# Patient Record
Sex: Female | Born: 1985 | Race: White | Hispanic: No | Marital: Married | Smoking: Current every day smoker
Health system: Southern US, Community
[De-identification: ages and names within clinical notes are randomized; demographics above are authoritative.]

## PROBLEM LIST (undated history)

## (undated) ENCOUNTER — Inpatient Hospital Stay (HOSPITAL_COMMUNITY): Payer: Self-pay

## (undated) DIAGNOSIS — O1490 Unspecified pre-eclampsia, unspecified trimester: Secondary | ICD-10-CM

## (undated) DIAGNOSIS — F329 Major depressive disorder, single episode, unspecified: Secondary | ICD-10-CM

## (undated) DIAGNOSIS — K8020 Calculus of gallbladder without cholecystitis without obstruction: Secondary | ICD-10-CM

## (undated) DIAGNOSIS — E039 Hypothyroidism, unspecified: Secondary | ICD-10-CM

## (undated) DIAGNOSIS — R4586 Emotional lability: Secondary | ICD-10-CM

## (undated) DIAGNOSIS — F32A Depression, unspecified: Secondary | ICD-10-CM

## (undated) DIAGNOSIS — K219 Gastro-esophageal reflux disease without esophagitis: Secondary | ICD-10-CM

## (undated) DIAGNOSIS — R5383 Other fatigue: Secondary | ICD-10-CM

## (undated) DIAGNOSIS — E785 Hyperlipidemia, unspecified: Secondary | ICD-10-CM

## (undated) DIAGNOSIS — I10 Essential (primary) hypertension: Secondary | ICD-10-CM

## (undated) DIAGNOSIS — E669 Obesity, unspecified: Secondary | ICD-10-CM

## (undated) DIAGNOSIS — Z8751 Personal history of pre-term labor: Secondary | ICD-10-CM

## (undated) DIAGNOSIS — Z8742 Personal history of other diseases of the female genital tract: Secondary | ICD-10-CM

## (undated) DIAGNOSIS — Z720 Tobacco use: Secondary | ICD-10-CM

## (undated) DIAGNOSIS — Q430 Meckel's diverticulum (displaced) (hypertrophic): Secondary | ICD-10-CM

## (undated) DIAGNOSIS — N83201 Unspecified ovarian cyst, right side: Secondary | ICD-10-CM

## (undated) DIAGNOSIS — F17200 Nicotine dependence, unspecified, uncomplicated: Secondary | ICD-10-CM

## (undated) DIAGNOSIS — F419 Anxiety disorder, unspecified: Secondary | ICD-10-CM

## (undated) DIAGNOSIS — F988 Other specified behavioral and emotional disorders with onset usually occurring in childhood and adolescence: Secondary | ICD-10-CM

## (undated) DIAGNOSIS — K760 Fatty (change of) liver, not elsewhere classified: Secondary | ICD-10-CM

## (undated) DIAGNOSIS — E569 Vitamin deficiency, unspecified: Secondary | ICD-10-CM

## (undated) DIAGNOSIS — Z8619 Personal history of other infectious and parasitic diseases: Secondary | ICD-10-CM

## (undated) DIAGNOSIS — B977 Papillomavirus as the cause of diseases classified elsewhere: Secondary | ICD-10-CM

## (undated) HISTORY — PX: DIAGNOSTIC LAPAROSCOPY: SUR761

## (undated) HISTORY — DX: Anxiety disorder, unspecified: F41.9

## (undated) HISTORY — PX: ACHILLES TENDON SURGERY: SHX542

## (undated) HISTORY — DX: Personal history of other infectious and parasitic diseases: Z86.19

## (undated) HISTORY — DX: Tobacco use: Z72.0

## (undated) HISTORY — DX: Fatty (change of) liver, not elsewhere classified: K76.0

## (undated) HISTORY — PX: CHOLECYSTECTOMY: SHX55

## (undated) HISTORY — PX: TUBAL LIGATION: SHX77

## (undated) HISTORY — DX: Essential (primary) hypertension: I10

## (undated) HISTORY — PX: CHOLECYSTECTOMY, LAPAROSCOPIC: SHX56

## (undated) HISTORY — DX: Meckel's diverticulum (displaced) (hypertrophic): Q43.0

## (undated) HISTORY — DX: Calculus of gallbladder without cholecystitis without obstruction: K80.20

## (undated) HISTORY — PX: WISDOM TOOTH EXTRACTION: SHX21

## (undated) HISTORY — DX: Hyperlipidemia, unspecified: E78.5

## (undated) HISTORY — DX: Other fatigue: R53.83

---
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
## 2006-04-05 ENCOUNTER — Ambulatory Visit: Payer: Self-pay | Admitting: Family Medicine

## 2008-04-29 ENCOUNTER — Ambulatory Visit: Payer: Self-pay | Admitting: Family Medicine

## 2008-04-29 DIAGNOSIS — E568 Deficiency of other vitamins: Secondary | ICD-10-CM

## 2008-04-29 DIAGNOSIS — K5909 Other constipation: Secondary | ICD-10-CM | POA: Insufficient documentation

## 2008-04-29 LAB — CONVERTED CEMR LAB
Specific Gravity, Urine: 1.015
pH: 7

## 2008-04-30 ENCOUNTER — Encounter: Payer: Self-pay | Admitting: Family Medicine

## 2008-05-02 ENCOUNTER — Encounter: Payer: Self-pay | Admitting: Family Medicine

## 2009-02-19 ENCOUNTER — Ambulatory Visit: Payer: Self-pay | Admitting: Family Medicine

## 2009-02-19 DIAGNOSIS — Z9189 Other specified personal risk factors, not elsewhere classified: Secondary | ICD-10-CM | POA: Insufficient documentation

## 2009-02-19 DIAGNOSIS — F17200 Nicotine dependence, unspecified, uncomplicated: Secondary | ICD-10-CM

## 2009-02-21 ENCOUNTER — Encounter: Payer: Self-pay | Admitting: Family Medicine

## 2009-02-24 LAB — CONVERTED CEMR LAB: Hemoglobin: 15.5 g/dL — ABNORMAL HIGH (ref 12.0–15.0)

## 2009-06-20 ENCOUNTER — Ambulatory Visit: Payer: Self-pay | Admitting: Family Medicine

## 2009-06-20 LAB — CONVERTED CEMR LAB
Nitrite: POSITIVE
Specific Gravity, Urine: 1.01

## 2009-09-03 ENCOUNTER — Ambulatory Visit: Payer: Self-pay | Admitting: Family Medicine

## 2009-09-19 ENCOUNTER — Emergency Department (HOSPITAL_COMMUNITY): Admission: EM | Admit: 2009-09-19 | Discharge: 2009-09-19 | Payer: Self-pay | Admitting: Emergency Medicine

## 2009-09-19 ENCOUNTER — Encounter: Payer: Self-pay | Admitting: Family Medicine

## 2009-10-06 ENCOUNTER — Ambulatory Visit: Payer: Self-pay | Admitting: Family Medicine

## 2009-10-06 DIAGNOSIS — R5383 Other fatigue: Secondary | ICD-10-CM

## 2009-10-06 DIAGNOSIS — R5381 Other malaise: Secondary | ICD-10-CM | POA: Insufficient documentation

## 2009-10-08 LAB — CONVERTED CEMR LAB
ALT: 50 units/L — ABNORMAL HIGH (ref 0–35)
AST: 38 units/L — ABNORMAL HIGH (ref 0–37)
Alkaline Phosphatase: 72 units/L (ref 39–117)
BUN: 4 mg/dL — ABNORMAL LOW (ref 6–23)
CO2: 30 meq/L (ref 19–32)
Creatinine, Ser: 0.7 mg/dL (ref 0.4–1.2)
Eosinophils Absolute: 0.1 10*3/uL (ref 0.0–0.7)
Free T4: 0.9 ng/dL (ref 0.6–1.6)
GFR calc non Af Amer: 110.69 mL/min (ref >60)
HCT: 47.3 % — ABNORMAL HIGH (ref 36.0–46.0)
Lymphocytes Relative: 35.1 % (ref 12.0–46.0)
Lymphs Abs: 3.6 10*3/uL (ref 0.7–4.0)
MCV: 92.5 fL (ref 78.0–100.0)
Monocytes Absolute: 0.8 10*3/uL (ref 0.1–1.0)
Potassium: 4.6 meq/L (ref 3.5–5.1)
Total Bilirubin: 0.9 mg/dL (ref 0.3–1.2)

## 2009-10-13 ENCOUNTER — Ambulatory Visit: Payer: Self-pay | Admitting: Family Medicine

## 2009-10-13 DIAGNOSIS — R740 Nonspecific elevation of levels of transaminase and lactic acid dehydrogenase [LDH]: Secondary | ICD-10-CM

## 2009-10-13 DIAGNOSIS — R7401 Elevation of levels of liver transaminase levels: Secondary | ICD-10-CM | POA: Insufficient documentation

## 2009-10-13 DIAGNOSIS — J019 Acute sinusitis, unspecified: Secondary | ICD-10-CM | POA: Insufficient documentation

## 2009-10-15 ENCOUNTER — Telehealth: Payer: Self-pay | Admitting: Family Medicine

## 2009-11-17 ENCOUNTER — Ambulatory Visit: Payer: Self-pay | Admitting: Family Medicine

## 2009-11-17 DIAGNOSIS — F4323 Adjustment disorder with mixed anxiety and depressed mood: Secondary | ICD-10-CM

## 2009-11-18 ENCOUNTER — Ambulatory Visit: Payer: Self-pay | Admitting: Cardiovascular Disease

## 2009-11-18 ENCOUNTER — Telehealth: Payer: Self-pay | Admitting: Family Medicine

## 2009-11-18 DIAGNOSIS — R51 Headache: Secondary | ICD-10-CM

## 2009-11-18 DIAGNOSIS — R519 Headache, unspecified: Secondary | ICD-10-CM | POA: Insufficient documentation

## 2009-11-25 ENCOUNTER — Telehealth: Payer: Self-pay | Admitting: Family Medicine

## 2009-11-25 ENCOUNTER — Emergency Department (HOSPITAL_COMMUNITY): Admission: EM | Admit: 2009-11-25 | Discharge: 2009-11-25 | Payer: Self-pay | Admitting: Emergency Medicine

## 2009-11-27 ENCOUNTER — Ambulatory Visit: Payer: Self-pay | Admitting: Professional

## 2009-12-08 ENCOUNTER — Encounter: Payer: Self-pay | Admitting: Family Medicine

## 2010-03-10 ENCOUNTER — Ambulatory Visit: Payer: Self-pay | Admitting: Family Medicine

## 2010-03-10 DIAGNOSIS — N390 Urinary tract infection, site not specified: Secondary | ICD-10-CM | POA: Insufficient documentation

## 2010-03-10 LAB — CONVERTED CEMR LAB
Casts: 0 /lpf
Glucose, Urine, Semiquant: NEGATIVE
Ketones, urine, test strip: NEGATIVE
Urine crystals, microscopic: 0 /hpf
Urobilinogen, UA: 0.2
Yeast, UA: 0

## 2010-03-11 ENCOUNTER — Encounter: Payer: Self-pay | Admitting: Family Medicine

## 2010-03-20 ENCOUNTER — Encounter: Payer: Self-pay | Admitting: Family Medicine

## 2010-03-20 ENCOUNTER — Ambulatory Visit: Payer: Self-pay | Admitting: Internal Medicine

## 2010-03-20 DIAGNOSIS — R1031 Right lower quadrant pain: Secondary | ICD-10-CM

## 2010-03-20 LAB — CONVERTED CEMR LAB
ALT: 18 units/L (ref 0–35)
AST: 19 units/L (ref 0–37)
Albumin: 4.1 g/dL (ref 3.5–5.2)
Basophils Relative: 0.3 % (ref 0.0–3.0)
Beta hcg, urine, semiquantitative: NEGATIVE
Bilirubin Urine: NEGATIVE
Bilirubin, Direct: 0.1 mg/dL (ref 0.0–0.3)
Blood in Urine, dipstick: NEGATIVE
CO2: 26 meq/L (ref 19–32)
Calcium: 9.5 mg/dL (ref 8.4–10.5)
Eosinophils Relative: 1.2 % (ref 0.0–5.0)
Glucose, Bld: 78 mg/dL (ref 70–99)
Glucose, Urine, Semiquant: NEGATIVE
KOH Prep: NEGATIVE
Ketones, urine, test strip: NEGATIVE
Lymphocytes Relative: 28.9 % (ref 12.0–46.0)
Monocytes Absolute: 0.8 10*3/uL (ref 0.1–1.0)
Neutrophils Relative %: 62.8 % (ref 43.0–77.0)
Platelets: 158 10*3/uL (ref 150.0–400.0)
Potassium: 4.7 meq/L (ref 3.5–5.1)
Protein, U semiquant: NEGATIVE
RDW: 13.8 % (ref 11.5–14.6)
Total Protein: 6.9 g/dL (ref 6.0–8.3)
WBC: 12.1 10*3/uL — ABNORMAL HIGH (ref 4.5–10.5)
pH: 7.5

## 2010-03-21 ENCOUNTER — Encounter: Payer: Self-pay | Admitting: Family Medicine

## 2010-09-08 NOTE — Progress Notes (Signed)
Summary: ? reaction to medicine  Phone Note Call from Patient Call back at Home Phone [phone]   Caller: Mom- Para March Summary of Call: Pt is taking augmentin for sinus infection- has taken this for 3 days now.  She has sores coming up on her tongue and her tongue is hurting.  She is supposed to be on this for 10 days.  Please advise on whether she should continue this or change to something else.  [facility] [address]. Initial call taken by: Lowella Petties CMA,  October 15, 2009 12:33 PM  Follow-up for Phone Call        that is an atypical reaction- but just the same I think we should change the abx  stop the augmentin px written on EMR for call in -- for zithromax keep me updated if not improved / follow up Follow-up by: Judith Part MD,  October 15, 2009 1:42 PM  Additional Follow-up for Phone Call Additional follow up Details #1::        Rx Called In, patient notified Additional Follow-up by: Linde Gillis CMA Duncan Dull),  October 15, 2009 1:52 PM    New/Updated Medications: ZITHROMAX Z-PAK 250 MG TABS (AZITHROMYCIN) take by mouth as directed Prescriptions: ZITHROMAX Z-PAK 250 MG TABS (AZITHROMYCIN) take by mouth as directed  ##1 pack x 0   Entered by:   Linde Gillis CMA (AAMA)   Authorized by:   Judith Part MD   Signed by:   Linde Gillis CMA (AAMA) on 10/15/2009   Method used:   Telephoned to ...       [facility] [address] [id number]       [address]       [place], [place]  [address]       Ph: [phone]       Fax: [phone]   RxID:   [id number]

## 2010-09-08 NOTE — Assessment & Plan Note (Signed)
Summary: ROA FOR 1 WEEK FOLLOW-UP/JRR   Vital Signs:  Patient profile:   25 year old female Height:      66 inches Weight:      148.50 pounds BMI:     24.06 Temp:     98.3 degrees F oral Pulse rate:   84 / minute Pulse rhythm:   regular BP sitting:   118 / 76  (left arm) Cuff size:   regular  Vitals Entered By: Lewanda Rife LPN (October 14, 4330 10:31 AM)  History of Present Illness: here for f/u of fatigue- did lab panel at last visit   feels about the same  really tired   yesterday R ear was bothering her  wonders if that has something to do with headache  ? if sinus infection -- always pain in R side and face  jaw has been sore  not a lot of congestion - but lot of pressure over the bridge of her nose and behind eyes   some worsening spring allergies over time    also chronic pain in R neck   ast/alt very slt elevated  has been taking a lot of advil for her headaches  some constipation  no alcohol  was also taking a "liver detox" med called betacol -- stopped that   smokes about 1/2-1ppd   hb is high - poss from smoking       Allergies (verified): No Known Drug Allergies  Past History:  Family History: Last updated: 10/06/2009 father - kidney stones , HTN, cholesterol  mother -- depression after a car accident  MGF - aneurysm GM, GF with alzeheimers   Social History: Last updated: 10/06/2009 Engaged, has been dating 5 years- engaged  smoke 1/2-1ppd  no drug abuse  no alcohol as a rule  1 -2 cups of coffee in ams , some tea  had chicken pox as a child  --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 Eyes:  Denies blurring, eye irritation, and eye pain. ENT:  Complains of earache, nasal congestion,  postnasal drainage, and sinus pressure; denies decreased hearing, difficulty swallowing, ear discharge, and sore throat. CV:  Denies chest pain or discomfort, lightheadness, palpitations, and shortness of breath with exertion. Resp:  Complains of cough; denies pleuritic, shortness of breath, sputum productive, and wheezing. GI:  Complains of constipation; denies abdominal pain, change in bowel habits, dark tarry stools, indigestion, loss of appetite, nausea, and vomiting. GU:  Denies dysuria and urinary frequency. MS:  Denies joint pain, joint redness, and joint swelling; neck is soe . Derm:  Denies itching, lesion(s), poor wound healing, and rash. Neuro:  Denies numbness and tingling. Psych:  still unsure if she could be depressed . Endo:  Denies cold intolerance, excessive thirst, excessive urination, and heat intolerance. Heme:  Denies abnormal bruising and bleeding. Allergy:  Complains of seasonal allergies and sneezing.  Physical Exam  General:  Well-developed,well-nourished,in no acute distress; alert,appropriate and cooperative throughout examination Head:  normocephalic, atraumatic, and no abnormalities observed.  some mild frontal and max sinus tenderness  Eyes:  vision grossly intact, pupils equal, pupils round, and pupils reactive to light.  no conjunctival pallor, injection or icterus  Ears:  R TM is dull with effusion but no erythema or bulging  L TM is nl  Nose:  nares are dry and congested bilat  Mouth:  pharynx pink and moist, no erythema, and no exudates.   Neck:  supple with full rom and no masses or thyromegally, no JVD or carotid bruit  Chest Wall:  No deformities, masses, or tenderness noted. Lungs:  Normal respiratory effort, chest expands symmetrically. Lungs are clear to auscultation, no crackles or wheezes. Heart:  Normal rate and regular rhythm. S1 and S2 normal without gallop, murmur, click, rub or other extra sounds. Abdomen:  Bowel sounds positive,abdomen soft  and non-tender without masses, organomegaly or hernias noted. Msk:  No deformity or scoliosis noted of thoracic or lumbar spine.  no acute joint changs  Extremities:  No clubbing, cyanosis, edema, or deformity noted with normal full range of motion of all joints.   Neurologic:  sensation intact to light touch, gait normal, and DTRs symmetrical and normal.   Skin:  Intact without suspicious lesions or rashes Cervical Nodes:  No lymphadenopathy noted Inguinal Nodes:  No significant adenopathy Psych:  somewhat blunted affect today- not tearful is fatigued  seemingly less depressed affect from last time    Impression & Recommendations:  Problem # 1:  FATIGUE (ICD-780.79) Assessment Unchanged  disc mult poss etiologies in light of facial painand ear pain and congestion- sinusitis is poss will tx this with augmentin - and f/u 1 mo  also pt will address current oc with gyn- considering iud  rev all labs in detail today adv to still consider poss of depression  Orders: Prescription Created Electronically [phone])  Problem # 2:  SINUSITIS - ACUTE-NOS (ICD-461.9) Assessment: New  see above tx with augmentin and also flonase for congestion and etd  f/u 1 mo update if worse in meantime  Her updated medication list for this problem includes:    Augmentin 875-125 Mg Tabs (Amoxicillin-pot clavulanate) .Marland Kitchen... 1 by mouth once daily for 10 days for sinus infection    Flonase 50 Mcg/act Susp (Fluticasone propionate) .Marland Kitchen... 2 sprays in each nostril one daily  Orders: Prescription Created Electronically [phone])  Problem # 3:  TOBACCO USE (ICD-305.1) Assessment: Unchanged  discussed in detail risks of smoking, and possible outcomes including COPD, vascular dz, cancer and also respiratory infections/sinus problems  pt voiced understanding  not ready to quit   Orders: Prescription Created Electronically [phone])  Problem # 4:  OTHER CONSTIPATION (ICD-564.09) Assessment: Improved  slt imp  with miralax- adv to try daily and update  augmentin may prod loose stools as well  Orders: Prescription Created Electronically [phone])  Problem # 5:  TRANSAMINASES, SERUM, ELEVATED (ICD-790.4) Assessment: New  this is mild and I suspect from liver detox herb as well as frequent advil for headaches will wean off those re check 1 mo  consider hepatitis check if not imp  no other sympt   Orders: Prescription Created Electronically [phone])  Complete Medication List: 1)  Muscadine Grape Seed  .... By mouth daily 2)  Uristat  .... Otc as directed as needed. 3)  Ibuprofen 200 Mg Tabs (Ibuprofen) .... Otc as directed. 4)  Loratadine 10 Mg Tabs (Loratadine) .... Otc as directed. 5)  Betacol  .... Take two daily 6)  Augmentin 875-125 Mg Tabs (Amoxicillin-pot clavulanate) .Marland Kitchen.. 1 by mouth once daily for 10 days for sinus infection 7)  Flonase 50 Mcg/act Susp (Fluticasone propionate) .... 2 sprays in each nostril one daily  Patient Instructions: 1)  take the augmentin as directed for sinus infection 2)  also flonase  for nose and ear congestion  3)  I sent these to your pharmacy  4)  if your headaches or fatigue worsen before follow up let me know  5)  follow up with me in 1 month for visit and re check labs Prescriptions: FLONASE 50 MCG/ACT SUSP (FLUTICASONE PROPIONATE) 2 sprays in each nostril one daily  #1 mdi x 11   Entered and Authorized by:   Judith Part MD   Signed by:   Judith Part MD on 10/13/2009   Method used:   [facility] to        [facility]       [address]       [place], [place]  [address]       Ph: [phone] or [phone]       Fax: [phone]   RxID:   [id number] AUGMENTIN 875-125 MG TABS (AMOXICILLIN-POT CLAVULANATE) 1 by mouth once daily for 10 days for sinus infection  #20 x 0   Entered and Authorized by:   Judith Part MD   Signed by:   Judith Part MD on 10/13/2009   Method used:   Electronically to         [facility] (retail)       [address]       [place], [place]  [address]       Ph: [phone] or [phone]       Fax: [phone]   RxID:   [id number]   Current Allergies (reviewed today): No known allergies

## 2010-09-08 NOTE — Progress Notes (Signed)
Summary: going to ER   Phone Note Call from Patient Call back at Home Phone [phone]   Caller: Mom Call For: Dr. Milinda Antis Summary of Call: Patients mom called and wanted to schedule appt. with our office for  tomorrow. She has an appt. with dr. Luiz Blare on Thursday. Patient is not eating, doesn't want to do anything except sleep, feels very fatigued. Mom wants patient to be admitted to hospital without going to ER. I spoke with Dr. Luiz Blare and he said it sounds like she is deffinately very depressed and if feeling like she is going to hurt her self she should go to behavioral health. I called patient back and she is going to ER because she doesn't know if it depression or if something is really wrong with her causing her to feel this sick and weak.  Initial call taken by: Melody Comas,  November 25, 2009 4:16 PM  Follow-up for Phone Call        thanks for the update - I agree with recommendation sorry I am not back in town until [place] to help with the matter  please keep me updated and send for ER records if and when availible  Follow-up by: Judith Part MD,  November 26, 2009 11:04 PM  Additional Follow-up for Phone Call Additional follow up Details #1::        ER notes on your shelf in your in box.Lewanda Rife LPN  November 27, 2009 8:43 AM

## 2010-09-08 NOTE — Assessment & Plan Note (Signed)
Summary: TIRED AND SLEEPING ALL THE TIME/[name]   Vital Signs:  Patient profile:   25 year old female Height:      66 inches Weight:      145.25 pounds BMI:     23.53 Temp:     98.7 degrees F oral Pulse rate:   84 / minute Pulse rhythm:   regular BP sitting:   106 / 74  (left arm) Cuff size:   regular  Vitals Entered By: Lewanda Rife LPN (October 06, 2009 10:17 AM)  History of Present Illness: is tired and exhausted -- (this was common in highschool)-- did have mono in the past  now is worse and worse -- feels like she could go to sleep at any time  gets 6-8 hours of sleep per night  does not really matter  sometimes has to take naps- when she can -- then sleeps for an hour (but could continue to sleep all night long)  does tend to have a lot of headaches neck stays sore and stiff -- goes to chiropractor (Dr Margaretha Sheffield)   went to ER a few weeks ago - woke up in middle of night with back pain around shoulder pain and numbness  her fingertips on L hand turned purple and prickly was worried about PE with smoking and OC did x rays and clotting tests and EKG and all was fine dx with likely muscle spasm   some stress- got into MRI program and doing well with that  no major problems   cries a lot becsue she does not feel good  does not feel motivated to do anything  has lots of things that need to get done -- having a hard time prioritizing and concentration   no ADD or meds in the past   does some exercise in the summer -- that may make her feel a tiny bit better if anything   just stopped her OC- is thinking about iud   occ headache - ibuprofen helps  wt is down 10 lb -- ? not eating as much / lost 20 lb in past year   ? 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 Eyes:  Denies blurring, discharge, and eye irritation. ENT:  Denies earache, hoarseness, nasal congestion, postnasal drainage, and sore  throat. CV:  Denies chest pain or discomfort and palpitations. Resp:  Denies cough, shortness of breath, and wheezing. GI:  Complains of constipation; denies abdominal pain, bloody stools, change in bowel habits, indigestion, nausea, and vomiting. GU:  Denies abnormal vaginal bleeding, discharge, and dysuria. MS:  Complains of joint pain, muscle aches, stiffness, and thoracic pain; denies joint redness, joint swelling, and muscle weakness. Derm:  Denies dryness, hair loss, itching, lesion(s), poor wound healing, and rash. Neuro:  Complains of headaches; denies numbness, tingling, tremors, visual disturbances, and weakness. Psych:  Complains of irritability; denies panic attacks, sense of great danger, and suicidal thoughts/plans. Endo:  Denies cold intolerance, excessive thirst, excessive urination, and heat intolerance. Heme:  Denies abnormal bruising and bleeding.  Physical Exam  General:  well but fatigued appearing  Head:   normocephalic, atraumatic, and no abnormalities observed.  no sinus or temporal tenderness  Eyes:  vision grossly intact, pupils equal, pupils round, and pupils reactive to light.  no conjunctival pallor, injection or icterus  Ears:  R ear normal and L ear normal.   Nose:  no nasal discharge.   Mouth:  pharynx pink and moist.   Neck:  supple with full rom and no masses or thyromegally, no JVD or carotid bruit  Chest Wall:  No deformities, masses, or tenderness noted. Lungs:  Normal respiratory effort, chest expands symmetrically. Lungs are clear to auscultation, no crackles or wheezes. Heart:  Normal rate and regular rhythm. S1 and S2 normal without gallop, murmur, click, rub or other extra sounds. Abdomen:  Bowel sounds positive,abdomen soft and non-tender without masses, organomegaly or hernias noted. Msk:  No deformity or scoliosis noted of thoracic or lumbar spine.  no acute joint changes  some neck and trapezius tenderness  Pulses:  R and L carotid,radial,femoral,dorsalis pedis and posterior tibial pulses are full and equal bilaterally Extremities:  No clubbing, cyanosis, edema, or deformity noted with normal full range of motion of all joints.   Neurologic:  cranial nerves II-XII intact, sensation intact to light touch, gait normal, DTRs symmetrical and normal, and toes down bilaterally on Babinski.   Skin:  Intact without suspicious lesions or rashes Cervical Nodes:  No lymphadenopathy noted Inguinal Nodes:  No significant adenopathy Psych:  seems down- tearful at times fair eye contact and communication skills    Impression & Recommendations:  Problem # 1:  FATIGUE (ICD-780.79) Assessment Deteriorated worsening over time with wt loss  need to rule out organic causes - but depression is in differential  also some headaches - need further eval  labs today send for ER records (? ? if elevated wbc)  f/u 1 week   Orders: Venipuncture (62694) TLB-B12 + Folate Pnl  (82746_82607-B12/FOL) TLB-BMP (Basic Metabolic Panel-BMET) (80048-METABOL) TLB-CBC Platelet - w/Differential (85025-CBCD) TLB-Hepatic/Liver Function Pnl (80076-HEPATIC) TLB-TSH (Thyroid Stimulating Hormone) (84443-TSH) TLB-T4 (Thyrox), Free [phone]) T-Vitamin D (25-Hydroxy) (00938-18299)  Problem # 2:  OTHER CONSTIPATION (ICD-564.09) Assessment: Unchanged ongoing- checking thyroid lab today trial of miralax good fluids and fiber disc further at f/u  Complete Medication List: 1)  Levora 0.15/30 (28) 0.15-30 Mg-mcg Tabs (Levonorgestrel-ethinyl estrad) .... As directed 2)  Muscadine Grape Seed  .... By mouth daily 3)  Uristat  .... Otc as directed. 4)  Ibuprofen 200 Mg Tabs (Ibuprofen) .... Otc as directed. 5)  Loratadine 10 Mg Tabs (Loratadine) .... Otc as directed.  Patient Instructions: 1)  try miralax over the counter for constipation- once daily with water as directed  2)  labs today for fatigue  3)  try to stay active  4)  follow up with me in about a week  5)  keep thinking about quitting smoking  6)  please send for recent ER records/labs/note and x rays from [place]   Current Allergies (reviewed today): No known allergies

## 2010-09-08 NOTE — Assessment & Plan Note (Signed)
Summary: UTI/[name]   Vital Signs:  Patient profile:   25 year old female Height:      66 inches Weight:      140.75 pounds BMI:     22.80 Temp:     99.4 degrees F oral Pulse rate:   92 / minute Pulse rhythm:   regular BP sitting:   128 / 80  (left arm) Cuff size:   regular  Vitals Entered By: Lewanda Rife LPN (March 10, 2010 11:58 AM) CC: ?UTI low back pain and frequency of urine but voiding small amts   History of Present Illness: started getting symptoms 7-10 days ago lower back is hurting - right beneath her ribs - worse on the L side  sides bother her too within last week - a little nausea  some mild cramping over bladder no burning - but is going frequently  low grade fever 99.4 - was 100 yesterday  is very worn out  odor to urine   not pregnant- no missed pills   no blood in urine  thinks menses was 2 weeks ago     Allergies: 1)  ! 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 CV:  Denies chest pain or discomfort and palpitations. Resp:  Denies cough and shortness of breath. GI:  Denies abdominal pain, change in bowel habits, and indigestion. GU:  Complains of dysuria and urinary frequency; denies hematuria. MS:  Complains of low back pain and mid back pain. Derm:  Denies lesion(s) and rash. Heme:  Denies abnormal bruising and  bleeding.  Physical Exam  General:  Well-developed,well-nourished,in no acute distress; alert,appropriate and cooperative throughout examination Head:  normocephalic, atraumatic, and no abnormalities observed.   Mouth:  pharynx pink and moist.   Neck:  No deformities, masses, or tenderness noted. no bony tenderness pain to rot R  Lungs:  Normal respiratory effort, chest expands symmetrically. Lungs are clear to auscultation, no crackles or wheezes. Heart:  Normal rate and regular rhythm. S1 and S2 normal without gallop, murmur, click, rub or other extra sounds. Abdomen:  Bowel sounds positive,abdomen soft and non-tender without masses, organomegaly or hernias noted. some mild suprapubic tenderness without rebound or gaurding  Msk:  mild R cva tenderness Neurologic:  gait normal and DTRs symmetrical and normal.   Skin:  Intact without suspicious lesions or rashes Cervical Nodes:  No lymphadenopathy noted Inguinal Nodes:  No significant adenopathy Psych:  normal affect, talkative and pleasant    Impression & Recommendations:  Problem # 1:  UTI (ICD-599.0) Assessment New uncomplicated with mildly pos ua  inst to drink lots of water disc ways to avoid utis  cipro for 5 d urine cx pt advised to update me if symptoms worsen or do not improve  Her updated medication list for this problem includes:    Cipro  250 Mg Tabs (Ciprofloxacin hcl) .Marland Kitchen... 1 by mouth two times a day for 5 days for uti  Orders: T-Culture, Urine ([id number]) UA Dipstick W/ Micro (manual) (13086) Prescription Created Electronically [phone])  Complete Medication List: 1)  Muscadine Grape Seed  .... By mouth daily 2)  Uristat  .... Otc as directed as needed. 3)  Loratadine 10 Mg Tabs (Loratadine) .... Otc as directed. 4)  Flonase 50 Mcg/act Susp (Fluticasone propionate) .... 2 sprays in each nostril one daily as needed 5)  Prozac 10 Mg Caps (Fluoxetine hcl) .... Take 1 capsule by mouth once a day 6)  Levora  0.15/30 (28) 0.15-30 Mg-mcg Tabs (Levonorgestrel-ethinyl estrad) .... Take 1 tablet by mouth once a day 7)  Topiramate 25 Mg Tabs (Topiramate) .... Take three tabs by mouth at bedtime. 8)  Cipro 250 Mg Tabs (Ciprofloxacin hcl) .Marland Kitchen.. 1 by mouth two times a day for 5 days for uti  Patient Instructions: 1)  continue drinking lots of water 2)  call or seek care is symptoms don't improve in 2-3 days or if you develop back pain, nausea, or vomiting 3)  take cipro as directed  4)  update me if worse  5)  I will contact you when your urine culture returns  Prescriptions: CIPRO 250 MG TABS (CIPROFLOXACIN HCL) 1 by mouth two times a day for 5 days for uti  #10 x 0   Entered and Authorized by:   Judith Part MD   Signed by:   Judith Part MD on 03/10/2010   Method used:   [facility]        [facility] [id number]       [address]       [place], [place]  [address]       Ph: [phone] or [phone]       Fax: [phone]   RxID:   [id number]   Current Allergies (reviewed today): ! AMOXICILLIN  Laboratory Results   Urine Tests  Date/Time Received: March 10, 2010 12:01 PM  Date/Time Reported: March 10, 2010 12:01 PM   Routine Urinalysis   Color: straw Appearance: slightly hazy Glucose: negative   (Normal Range: Negative) Bilirubin: negative   (Normal Range: Negative) Ketone: negative   (Normal Range: Negative) Spec. Gravity: 1.010   (Normal Range: 1.003-1.035) Blood: trace-intact   (Normal Range: Negative) pH: 7.0   (Normal Range: 5.0-8.0) Protein: trace   (Normal Range: Negative) Urobilinogen: 0.2   (Normal Range: 0-1) Nitrite: negative   (Normal Range: Negative) Leukocyte Esterace: trace   (Normal Range: Negative)  Urine Microscopic WBC/HPF: 3-4 RBC/HPF: 2-3 Bacteria/HPF: many Mucous/HPF: few Epithelial/HPF: 1-2 Crystals/HPF: 0 Casts/LPF: 0 Yeast/HPF: 0 Other: 0

## 2010-09-08 NOTE — Assessment & Plan Note (Signed)
Summary: ??uti/alc   Vital Signs:  Patient profile:   25 year old female Weight:      138.75 pounds Temp:     98.2 degrees F oral Pulse rate:   76 / minute Pulse rhythm:   regular BP sitting:   134 / 82  (left arm) Cuff size:   regular  Vitals Entered By: Selena Batten Dance CMA Duncan Dull) (March 20, 2010 11:25 AM) CC: ? UTI (back pain and RLQ pain)   History of Present Illness: CC: ? continued UTI  1 mo h/o change in urination (increased frequency, no burning, + odor, + urgency).  + leg cramps x 1 wk.  + skin itching all over.  + RLQ pain as well as back pain right flank (sharp alternates with dull).  Feeling of bloated stomache.  and feels knot right abd.  ? yellowing of eyes.  + very dry mouth.  No fevers/chills, vomiting, diarrhea.  + nausea and indigestion and constipation (taking stool softeners).  No sig change in vag d/c characteristic.  Seen last week and finished course of cipro x 5 days.    On further questioning, pt endorses taking mother's adderall 2/2 feeling fatigued for last 3 weeks (3-4 pills a day).  Mom knows about it.  Preventive Screening-Counseling & Management  Alcohol-Tobacco     Alcohol drinks/day: <1     Smoking Status: current     Packs/Day: 1/2  Caffeine-Diet-Exercise     Caffeine use/day: 1 cup      Drug Use:  never.    Current Medications (verified): 1)  Muscadine Grape Seed .... By Mouth Daily 2)  Uristat .... Otc As Directed As Needed. 3)  Loratadine 10 Mg Tabs (Loratadine) .... Otc As Directed. 4)  Flonase 50 Mcg/act Susp (Fluticasone Propionate) .... 2 Sprays in Each Nostril One Daily As Needed 5)  Levora 0.15/30 (28) 0.15-30 Mg-Mcg Tabs (Levonorgestrel-Ethinyl Estrad) .... Take 1 Tablet By Mouth Once A Day 6)  Topiramate 25 Mg Tabs (Topiramate) .... Take Three Tabs By Mouth At Bedtime.  Allergies: 1)  ! Hydrocodone 2)  Amoxicillin  Social History: Smoking Status:  current Packs/Day:  1/2 Caffeine use/day:  1 cup Drug Use:  never  Review  of Systems       per HPI  Physical Exam  General:  Well-developed,well-nourished,in no acute distress; alert,appropriate and cooperative throughout examination Lungs:  Normal respiratory effort, chest expands symmetrically. Lungs are clear to auscultation, no crackles or wheezes. Heart:  Normal rate and regular rhythm. S1 and S2 normal without gallop, murmur, click, rub or other extra sounds. Abdomen:  NABS, ND, tender to palpation R flank and RLQ.  negative rebound.  + CVA tenderness on right.   Genitalia:  Pelvic Exam:        External: normal female genitalia without lesions or masses        Vagina: normal without lesions or masses, slight white discharge        Cervix: normal without lesions or masses, no CMT        Adnexa: normal bimanual exam without masses or fullness, bilateral ovaries nontender at palpation        Uterus: normal by palpation        Pap smear: not performed CT/GC sent. Pulses:  2+ periph pulses Extremities:  No clubbing, cyanosis, edema, or deformity noted with normal full range of motion of all joints.   Skin:  Intact without suspicious lesions or rashes   Impression & Recommendations:  Problem # 1:  RLQ PAIN (ICD-789.03) with flank pain.  Upreg negative.  no blood in UA today, points against stone (and pain not typical of stone).  Duration of pain points against appendicitis. Pelvic overall normal and reassuring.  Wet prep negative for infection.  Sent CT/GC. UA again suspicious for infx (despite recent normal UCx), sent culture, treat with keflex course x 5 days (non-critical rxn to amox in chart).  ? Ecoli resistant to Cipro? Basic blood work to check K, WBC, Cr, LFTs. 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  discussed with patient and mother.  given has been taking for 3 wks and has taken up to3-4 pills a day, chose to taper dose down, provided with script for 7 pills, should taper down over next week then stop.  Return 1-2 wks to Dr. Milinda Antis for f/u,  sooner if red flags (fevers, worsening instead of improving abd pain)  Orders: TLB-BMP (Basic Metabolic Panel-BMET) (80048-METABOL) TLB-CBC Platelet - w/Differential (85025-CBCD) TLB-Hepatic/Liver Function Pnl (80076-HEPATIC) T-Culture, Urine (96295-28413) TLB-Udip w/ Micro (81001-URINE) TLB-Wet Mount / Fungus (87210-WPREP) T-Chlamydia Probe, genital (24401-02725) T-GC Probe, genital (36644-03474)  Complete Medication List: 1)  Muscadine Grape Seed  .... By mouth daily 2)  Uristat  .... Otc as directed as needed. 3)  Loratadine 10 Mg Tabs (Loratadine) .... Otc as directed. 4)  Flonase 50 Mcg/act Susp (Fluticasone propionate) .... 2 sprays in each nostril one daily as needed 5)  Levora 0.15/30 (28) 0.15-30 Mg-mcg Tabs (Levonorgestrel-ethinyl estrad) .... Take 1 tablet by mouth once a day 6)  Topiramate 25 Mg Tabs (Topiramate) .... Take three tabs by mouth at bedtime. 7)  Adderall 5 Mg Tabs (Amphetamine-dextroamphetamine) .... One daily for 3 days then one every other day until out. 8)  Keflex 500 Mg Caps (Cephalexin) .... One by mouth two times a day x 5 days  Patient Instructions: 1)  Actually, adderall can cause many of your symptoms. 2)  I would recommend taking one tablet a day for next 3 days then on every other day for 2 days then stopping. 3)  Adderall adverse effects handout provided. 4)  As the adderall gets out of your system, you should start feeling better.  Please return if you have high fevers >101.5, worsening abdominal pain, or if not improving as expected. 5)  We will let you know blood work results. 6)  Your pelvic exam was normal today. 7)  Call clinic with questions.  Pleasure to meet you today. Prescriptions: KEFLEX 500 MG CAPS (CEPHALEXIN) one by mouth two times a day x 5 days  #10 x 0   Entered and Authorized by:   Eustaquio Boyden  MD   Signed by:   Eustaquio Boyden  MD on 03/20/2010   Method used:   Electronically to        [facility]  [address]  [address]* (retail)       [address]       [place], [place]  [address]       Ph: [phone] or [phone]       Fax: [phone]   RxID:   [id number] ADDERALL 5 MG TABS (AMPHETAMINE-DEXTROAMPHETAMINE) one daily for 3 days then one every other day until out.  #7 x 0   Entered and Authorized by:   Eustaquio Boyden  MD   Signed by:   Eustaquio Boyden  MD on 03/20/2010   Method used:   Print then Give to Patient   RxID:   [id number]   Current Allergies (reviewed today): !  HYDROCODONE AMOXICILLIN  Laboratory Results   Urine Tests  Date/Time Received: March 20, 2010 11:27 AM  Date/Time Reported: March 20, 2010 11:27 AM   Routine Urinalysis   Color: yellow Appearance: Hazy Glucose: negative   (Normal Range: Negative) Bilirubin: negative   (Normal Range: Negative) Ketone: negative   (Normal Range: Negative) Spec. Gravity: 1.010   (Normal Range: 1.003-1.035) Blood: negative   (Normal Range: Negative) pH: 7.5   (Normal Range: 5.0-8.0) Protein: negative   (Normal Range: Negative) Urobilinogen: 0.2   (Normal Range: 0-1) Nitrite: negative   (Normal Range: Negative) Leukocyte Esterace: moderate   (Normal Range: Negative)  Urine Microscopic WBC/HPF: 10-20 RBC/HPF: 0-3 Bacteria/HPF: many rods Mucous/HPF: none Epithelial/HPF: 5-10 Crystals/HPF: none Casts/LPF: none Yeast/HPF: none    Urine HCG: negative Comments: micro performed by..........Eustaquio Boyden  MD  March 20, 2010 1:43 PM  urine sent for culture.   [employer] Source: vaginal WBC/hpf: 5-10 Bacteria/hpf: 2+  Rods Clue cells/hpf: few  Negative whiff Yeast/hpf: none Wet [place] KOH: Negative Trichomonas/hpf: none Comments: read by ....................... Eustaquio Boyden  MD  March 20, 2010 1:44 PM      Appended Document: ??uti/alc    Clinical Lists Changes  Orders: Added new Service order of Urine Pregnancy Test  (16109) - Signed Added new Service order of UA Dipstick W/ Micro (manual)  (81000) - Signed Added new Service order of Wet Prep (60454UJ) - Signed

## 2010-09-08 NOTE — Assessment & Plan Note (Signed)
Summary: ONE MTH F/U & RE-CHECK LABS / LFW   Vital Signs:  Patient profile:   25 year old female Height:      66 inches Weight:      142 pounds BMI:     23.00 Temp:     98.9 degrees F oral Pulse rate:   72 / minute Pulse rhythm:   regular BP sitting:   124 / 80  (left arm) Cuff size:   regular  Vitals Entered By: Lewanda Rife LPN (November 17, 2009 10:22 AM) CC: one month f/u and recheck labs   History of Present Illness: here for f/u of fatigue and sinusitis and inc lfts  last visit tx for sinusitis with augmentin- had adv eff of mouth sores was changed to zithromax  at first it seemed to help  still has a headache - not improved (at first the nasal spray helped) worse over past week - poss due to allergies on top of it  pressure in her face and ears - getting lightheaded  still cannot get much out of nose - brownish- yellow- but very little of it -- cannot get anything out  feels pressure behind eyes - hurts to press on it   due to re check lfts -- were elevated with heavy advil use   OC-- found out the insurance does not cover the IUD  back on OC -- off it for 1 mo and did not make a difference   fatigue --- just the same no improvment  her obgyn gave her prozac too - just finshed 1 month -- has not made any difference  just does not know if she is depressed  closer to her period - does cry a lot  wants to see if prozac helps her  lots of stressors in her life -- brother is depresssed and suicidal -- and ended up going to jail  thinks she is not coping thiings as well as she should   neck still bothers her  - last week and rad to arm   wt is down 6 lb - appetite has been cut    prev labs otherwise nl   Allergies (verified): 1)  ! 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 Eyes:  Denies blurring and eye irritation. CV:  Denies chest pain or discomfort, lightheadness, palpitations, and shortness of breath with exertion. Resp:  Denies cough and wheezing. GI:  Denies abdominal pain, bloody stools, change in bowel habits, indigestion, and nausea. GU:  Denies abnormal vaginal bleeding, discharge, and dysuria. MS:  Complains of stiffness; denies joint pain, joint redness, and joint swelling;  neck is sore. Derm:  Denies itching, lesion(s), poor wound healing, and rash. Neuro:  Complains of headaches; denies numbness, tingling, and tremors. Psych:  Complains of easily tearful and irritability. Endo:  Denies cold intolerance, excessive thirst, excessive urination, and heat intolerance. Heme:  Denies abnormal bruising and bleeding.  Physical Exam  General:  Well-developed,well-nourished,in no acute distress; alert,appropriate and cooperative throughout examination Head:  normocephalic, atraumatic, and no abnormalities observed.  some moderate frontal and max sinus tenderness  no temporal tenderness  Eyes:  vision grossly intact, pupils equal, pupils round, pupils reactive to light, and no injection.   Ears:  R ear normal.  L ear- TM small effusion Nose:  nares mildly congested  Mouth:  pharynx pink and moist, no erythema, and no  exudates.   Neck:  No deformities, masses, or tenderness noted. no bony tenderness pain to rot R  Chest Wall:  No deformities, masses, or tenderness noted. Lungs:  Normal respiratory effort, chest expands symmetrically. Lungs are clear to auscultation, no crackles or wheezes. Heart:  Normal rate and regular rhythm. S1 and S2 normal without gallop, murmur, click, rub or other extra sounds. Abdomen:  Bowel sounds positive,abdomen soft and non-tender without masses, organomegaly or hernias noted. Msk:  No deformity or scoliosis noted of thoracic or lumbar spine.  no acute joint changes  Pulses:  R and L carotid,radial,femoral,dorsalis pedis and posterior tibial pulses are full and equal bilaterally Extremities:  No clubbing, cyanosis, edema, or deformity noted with normal full range of motion of all joints.   Neurologic:  sensation intact to light touch, gait normal, and DTRs symmetrical and normal.  no tremor  Skin:  Intact without suspicious lesions or rashes Cervical Nodes:  No lymphadenopathy noted Inguinal Nodes:  No significant adenopathy Psych:  seems very fatigued and frustrated  disc stressors in gaurded fashion fair eye contact   Impression & Recommendations:  Problem # 1:  SINUSITIS - ACUTE-NOS (ICD-461.9) Assessment Deteriorated still quite symptomatic after tx with zithromax (non tol augmentin) allergies are bad as well- adv continue flonase  sent for sinus Ct and if neg [facility]   The following medications were removed from the medication list:    Zithromax Z-pak 250 Mg Tabs (Azithromycin) .Marland Kitchen... Take by mouth as directed Her updated medication list for this problem includes:    Flonase 50 Mcg/act Susp (Fluticasone propionate) .Marland Kitchen... 2 sprays in each nostril one daily as needed  Orders: Radiology Referral (Radiology)  Problem # 2:  FATIGUE (ICD-780.79) Assessment: Unchanged ongoing about the same- no imp with prozac so far disc situational  stress and fam hx of dep  feel this is multifactorial with above plus chronic headache and neck complaints   Problem # 3:  MOOD SWINGS (ICD-296.99) Assessment: Unchanged disc menstrual mood changes with pt - she will continue the prozac through this menses and update her gyn re: if it helps  still disc poss of depression as well- but the prozac not making a big difference   Complete Medication List: 1)  Muscadine Grape Seed  .... By mouth daily 2)  Uristat  .... Otc as directed as needed. 3)  Ibuprofen 200 Mg Tabs (Ibuprofen) .... Otc as directed. 4)  Loratadine 10 Mg Tabs (Loratadine) .... Otc as directed. 5)  Flonase 50 Mcg/act Susp (Fluticasone propionate) .... 2 sprays in each nostril one daily as needed 6)  Prozac 10 Mg Caps (Fluoxetine hcl) .... Take 1 capsule by mouth once a day 7)  Levora 0.15/30 (28) 0.15-30 Mg-mcg Tabs (Levonorgestrel-ethinyl estrad) .... Take 1 tablet by mouth once a day  Patient Instructions: 1)  we will schedule sinus CT at check out  2)  if all negative -will refer you to headache clinic  3)  continue the prozac to see how it works with period  4)  continue the flonase  5)  let me know if you are interested in counseling in future - can call   Current Allergies (reviewed today): ! AMOXICILLIN

## 2010-09-08 NOTE — Assessment & Plan Note (Signed)
Summary: FLU SHOT  Nurse Visit   Allergies: No Known Drug Allergies  Orders Added: 1)  Admin 1st Vaccine [90471] 2)  Flu Vaccine 67yrs + [88416]   Flu Vaccine Consent Questions     Do you have a history of severe allergic reactions to this vaccine? no    Any prior history of allergic reactions to egg and/or gelatin? no    Do you have a sensitivity to the preservative Thimersol? no    Do you have a past history of Guillan-Barre Syndrome? no    Do you currently have an acute febrile illness? no    Have you ever had a severe reaction to latex? no    Vaccine information given and explained to patient? yes    Are you currently pregnant? no    Lot Number:[id number]   Exp Date:02/05/2010   Site Given  Right Deltoid IM

## 2010-09-08 NOTE — Progress Notes (Signed)
  Phone Note From Other Clinic   Caller: Referral Coordinator Summary of Call: Margret Chance called to let you know that this patient called her today to get an urgent appt. she scheduled her with Dr Luiz Blare today at [facility] ([facility] she wanted you to be aware of this appt. Initial call taken by: Carlton Adam,  November 25, 2009 10:00 AM  Follow-up for Phone Call        thanks for the update - I'm glad she is seeing counselor  Follow-up by: Judith Part MD,  November 25, 2009 10:19 AM

## 2010-09-08 NOTE — Progress Notes (Signed)
Summary: sinus pressure  Phone Note Call from Patient Call back at Home Phone [phone]   Caller: Patient Summary of Call: Pt is having a lot of sinus pressure and pain and is having difficulty sleeping.  She is asking for either something for pain or something to help her sleep. She has classes and needs her sleep.   [facility] Initial call taken by: Lowella Petties CMA,  November 18, 2009 12:31 PM  Follow-up for Phone Call        try 25-50 mg of benadryl 1 hour before bed -- tell me if this helps-- it may help both sinuses and sleep Follow-up by: Judith Part MD,  November 18, 2009 1:05 PM  Additional Follow-up for Phone Call Additional follow up Details #1::        Patient notified as instructed by telephone. Pt will call back and update how doing.Lewanda Rife LPN  November 18, 2009 3:36 PM

## 2010-09-08 NOTE — Consult Note (Signed)
[facility]   Imported By: Lanelle Bal 12/18/2009 13:16:25  _____________________________________________________________________  External Attachment:    Type:   Image     Comment:   External Document

## 2010-10-29 LAB — CBC
HCT: 43.9 % (ref 36.0–46.0)
Hemoglobin: 15.1 g/dL — ABNORMAL HIGH (ref 12.0–15.0)
Platelets: 174 10*3/uL (ref 150–400)
RDW: 13.3 % (ref 11.5–15.5)

## 2010-10-29 LAB — URINALYSIS, ROUTINE W REFLEX MICROSCOPIC
Hgb urine dipstick: NEGATIVE
Ketones, ur: NEGATIVE mg/dL
Protein, ur: NEGATIVE mg/dL
Urobilinogen, UA: 1 mg/dL (ref 0.0–1.0)

## 2010-10-29 LAB — POCT I-STAT, CHEM 8
BUN: <3 mg/dL — ABNORMAL LOW (ref 6–23)
Creatinine, Ser: 0.9 mg/dL (ref 0.4–1.2)
Glucose, Bld: 111 mg/dL — ABNORMAL HIGH (ref 70–99)
HCT: 47 % — ABNORMAL HIGH (ref 36.0–46.0)
Hemoglobin: 16 g/dL — ABNORMAL HIGH (ref 12.0–15.0)
Sodium: 137 mEq/L (ref 135–145)
TCO2: 27 mmol/L (ref 0–100)

## 2010-10-29 LAB — DIFFERENTIAL
Basophils Relative: 0 % (ref 0–1)
Lymphocytes Relative: 35 % (ref 12–46)
Lymphs Abs: 5.6 10*3/uL — ABNORMAL HIGH (ref 0.7–4.0)
Monocytes Relative: 7 % (ref 3–12)

## 2010-10-29 LAB — D-DIMER, QUANTITATIVE: D-Dimer, Quant: 0.24 ug/mL-FEU (ref 0.00–0.48)

## 2010-10-29 LAB — POCT PREGNANCY, URINE: Preg Test, Ur: NEGATIVE

## 2011-01-19 ENCOUNTER — Ambulatory Visit (INDEPENDENT_AMBULATORY_CARE_PROVIDER_SITE_OTHER): Payer: BC Managed Care – PPO | Admitting: Family Medicine

## 2011-01-19 ENCOUNTER — Ambulatory Visit: Payer: Self-pay | Admitting: Family Medicine

## 2011-01-19 ENCOUNTER — Encounter: Payer: Self-pay | Admitting: Family Medicine

## 2011-01-19 DIAGNOSIS — R197 Diarrhea, unspecified: Secondary | ICD-10-CM

## 2011-01-19 NOTE — Patient Instructions (Signed)
Clear liqs through tomorrow. See through glass, Coca Cola, Jello, and chicken noodle soup. Next day, BRAT Bananas, Rice,  Apples, Toast. Slowly add next day regular food. If diarrhea continues, take Immodium per label.  Try Citrucel if continues.

## 2011-01-19 NOTE — Progress Notes (Signed)
  Subjective:    Patient ID: Michele Rojas, female    DOB: 1985/08/25, 25 y.o.   MRN: [id number]  HPI Pt of Dr Royden Purl here as acute appt for diarrhea for the last two weeks. Pepto has not helped. She has not had any normal BM at all for two weeks. They have been either liquid or extremely soft. She has seen "blood a couple of times but thinks from wiping so much. She sees mucous. She has been on Abs for 4-5 days for throat irritation from her mother. She has done no travelling. She has not had any unusual foods lately. Her diet is typically salada and chicken, some beef and some vegetables. She has had some nausea, no vomiting. She has not had exposure to sickness.     Review of Systems Noncontributory except as above.       Objective:   Physical Exam  Constitutional: She appears well-developed and well-nourished. No distress.  HENT:  Head: Normocephalic and atraumatic.  Right Ear: External ear normal.  Left Ear: External ear normal.  Nose: Nose normal.  Mouth/Throat: Oropharynx is clear and moist. No oropharyngeal exudate.  Eyes: Conjunctivae and EOM are normal. Pupils are equal, round, and reactive to light.  Neck: Normal range of motion. Neck supple. No thyromegaly present.  Cardiovascular: Normal rate, regular rhythm and normal heart sounds.   Pulmonary/Chest: Effort normal and breath sounds normal. She has no wheezes. She has no rales.  Abdominal: Soft. Bowel sounds are normal. She exhibits no distension and no mass. There is no tenderness. There is no rebound and no guarding.       Generally nml abd exam.  Lymphadenopathy:    She has no cervical adenopathy.  Skin: She is not diaphoretic.          Assessment & Plan:

## 2011-01-19 NOTE — Assessment & Plan Note (Signed)
See instructions. Had been on 4 days of Mom's AB, unknown type or age. Doubt C. Diff but discussed if sxs continue. Looks well hydrated and to be tolerating well. Initially take low key, conservative approach.  Used to be constipated chronically, poss resetting irritable bowel. Discussed Fiber.

## 2011-05-21 ENCOUNTER — Ambulatory Visit (INDEPENDENT_AMBULATORY_CARE_PROVIDER_SITE_OTHER): Payer: BC Managed Care – PPO | Admitting: Family Medicine

## 2011-05-21 ENCOUNTER — Encounter: Payer: Self-pay | Admitting: Family Medicine

## 2011-05-21 VITALS — BP 120/76 | HR 84 | Temp 99.1°F | Wt 174.8 lb

## 2011-05-21 DIAGNOSIS — H109 Unspecified conjunctivitis: Secondary | ICD-10-CM

## 2011-05-21 NOTE — Patient Instructions (Addendum)
I think you do have pink eye. Treat with removal of mascara as well as contacts for 1-2 weeks. Lubricating eye drops throughout the day (hypotears or rephresh). If not improving as expected, fever >101, or worsening pain or pus discharge, let us know.  Pink Eye (Conjunctivitis, Viral) Conjunctivitis is an irritation (inflammation) of the clear membrane that covers the white part of the eye (the conjunctiva). The irritation can also happen on the underside of the eyelids. Conjunctivitis makes the eye red or pink in color. This is what is commonly known as pink eye. Viral conjunctivitis can spread easily (contagious). CAUSES  Infection from virus on the surface of the eye.   Infection from the irritation or injury of nearby tissues such as the eyelids or cornea.   More serious inflammation or infection on the inside of the eye.   Other eye diseases.   The use of certain eye medications.  SYMPTOMS The normally white color of the eye or the underside of the eyelid is usually pink or red in color. The pink eye is usually associated with irritation, tearing and some sensitivity to light. Viral conjunctivitis is often associated with a clear, watery discharge. If a discharge is present, there may also be some blurred vision in the affected eye. DIAGNOSIS Conjunctivitis is diagnosed by an eye exam. The eye specialist looks for changes in the surface tissues of the eye which take on changes characteristic of the specific types of conjunctivitis. A sample of any discharge may be collected on a Q-Tip (sterile swap). The sample will be sent to a lab to see whether or not the inflammation is caused by bacterial or viral infection. TREATMENT Viral conjunctivitis will not respond to medicines that kill germs (antibiotics). Treatment is aimed at stopping a bacterial infection on top of the viral infection. The goal of treatment is to relieve symptoms (such as itching) with antihistamine drops or other eye  medications.  HOME CARE INSTRUCTIONS  To ease discomfort, apply a cool, clean wash cloth to your eye for 10 to 20 minutes, 3 to 4 times a day.   Gently wipe away any drainage from the eye with a warm, wet washcloth or a cotton ball.   Wash your hands often with soap and use paper towels to dry.   Do not share towels or washcloths. This may spread the infection.   Change or wash your pillowcase every day.   You should not use eye make-up until the infection is gone.   Stop using contacts lenses. Ask your eye professional how to sterilize or replace them before using again. This depends on the type of contact lenses used.   Do not touch the edge of the eyelid with the eye drop bottle or ointment tube when applying medications to the affected eye. This will stop you from spreading the infection to the other eye or to others.  SEEK IMMEDIATE MEDICAL CARE IF:  The infection has not improved within 3 days of beginning treatment.   A watery discharge from the eye develops.   Pain in the eye increases.   The redness is spreading.   Vision becomes blurred.   An oral temperature above 101 develops, or as your caregiver suggests.   Facial pain, redness or swelling develops.   Any problems that may be related to the prescribed medicine develop.  MAKE SURE YOU:   Understand these instructions.   Will watch your condition.   Will get help right away if you are not  doing well or get worse.  Document Released: 07/26/2005 Document Re-Released: 07/08/2008 [facility] Patient Information 2011 [place], [place].

## 2011-05-21 NOTE — Assessment & Plan Note (Addendum)
Anticipate viral conjunctivitis. rec off contacts for 1-2 wks, rec not sleeping in contacts period, rec no mascara or eye makeup for next 1-2 wks. Lubricating eye drops. Discussed contagious nature of illness. Update me if not improving as expected or red flags to consider bacterial etiology.

## 2011-05-21 NOTE — Progress Notes (Signed)
  Subjective:    Patient ID: Michele Rojas, female    DOB: Oct 27, 1985, 25 y.o.   MRN: [id number]  HPI CC: red eye  1 wk h/o eye redness and matting in am.  Started right side, then progressed to left.  Mild watering of eyes.  Took contacts out for 6 days, eyes got better.  Placed contacts back in yesterday, redness returned.  Uses contacts she can sleep in, changes them about every 2 months, unsure how long theyre supposed to stay in.  Due for eye check.  No new mascara.  Today started itching.  No recent fevers/chills, cough, congestion, RN.  No HA, vision changes.  No eye pain.  Review of Systems Per HPI    Objective:   Physical Exam  Nursing note and vitals reviewed. Constitutional: She appears well-developed and well-nourished. No distress.  HENT:  Head: Normocephalic and atraumatic.  Right Ear: External ear normal.  Left Ear: External ear normal.  Nose: Nose normal.  Mouth/Throat: Oropharynx is clear and moist. No oropharyngeal exudate.  Eyes: EOM are normal. Pupils are equal, round, and reactive to light. Right eye exhibits no discharge. Left eye exhibits no discharge. Right conjunctiva is injected. Left conjunctiva is injected. No scleral icterus. Right eye exhibits normal extraocular motion. Left eye exhibits normal extraocular motion.         No pain with eye movements. Limbic sparing. Bulbar conjunctiva injected R>L, medial > lateral Significant mascara bilaterally  Neck: Normal range of motion. Neck supple.  Lymphadenopathy:    She has no cervical adenopathy.  Skin: Skin is warm and dry. No rash noted.  Psychiatric: She has a normal mood and affect.          Assessment & Plan:

## 2011-05-30 ENCOUNTER — Inpatient Hospital Stay (HOSPITAL_COMMUNITY)
Admission: EM | Admit: 2011-05-30 | Discharge: 2011-06-01 | DRG: 494 | Disposition: A | Discharge status: Home / Self Care | Payer: BC Managed Care – PPO | Attending: General Surgery | Admitting: General Surgery

## 2011-05-30 DIAGNOSIS — F3289 Other specified depressive episodes: Secondary | ICD-10-CM | POA: Diagnosis present

## 2011-05-30 DIAGNOSIS — F329 Major depressive disorder, single episode, unspecified: Secondary | ICD-10-CM | POA: Diagnosis present

## 2011-05-30 DIAGNOSIS — G43909 Migraine, unspecified, not intractable, without status migrainosus: Secondary | ICD-10-CM | POA: Diagnosis present

## 2011-05-30 DIAGNOSIS — M25519 Pain in unspecified shoulder: Secondary | ICD-10-CM | POA: Diagnosis not present

## 2011-05-30 DIAGNOSIS — K810 Acute cholecystitis: Principal | ICD-10-CM | POA: Diagnosis present

## 2011-05-30 DIAGNOSIS — K8010 Calculus of gallbladder with chronic cholecystitis without obstruction: Secondary | ICD-10-CM

## 2011-05-31 ENCOUNTER — Emergency Department (HOSPITAL_COMMUNITY): Payer: BC Managed Care – PPO

## 2011-05-31 ENCOUNTER — Other Ambulatory Visit (INDEPENDENT_AMBULATORY_CARE_PROVIDER_SITE_OTHER): Payer: Self-pay | Admitting: General Surgery

## 2011-05-31 LAB — CBC
Hemoglobin: 14.3 g/dL (ref 12.0–15.0)
MCV: 89.1 fL (ref 78.0–100.0)
Platelets: 206 10*3/uL (ref 150–400)
RDW: 13.7 % (ref 11.5–15.5)
WBC: 12.5 10*3/uL — ABNORMAL HIGH (ref 4.0–10.5)

## 2011-05-31 LAB — DIFFERENTIAL
Basophils Absolute: 0 10*3/uL (ref 0.0–0.1)
Basophils Relative: 0 % (ref 0–1)
Eosinophils Absolute: 0.1 10*3/uL (ref 0.0–0.7)
Monocytes Relative: 8 % (ref 3–12)
Neutro Abs: 7.4 10*3/uL (ref 1.7–7.7)
Neutrophils Relative %: 59 % (ref 43–77)

## 2011-05-31 LAB — COMPREHENSIVE METABOLIC PANEL
ALT: 15 U/L (ref 0–35)
CO2: 23 mEq/L (ref 19–32)
Calcium: 9.4 mg/dL (ref 8.4–10.5)
GFR calc Af Amer: >90 mL/min (ref >90)
GFR calc non Af Amer: >90 mL/min (ref >90)
Glucose, Bld: 110 mg/dL — ABNORMAL HIGH (ref 70–99)
Sodium: 136 mEq/L (ref 135–145)
Total Bilirubin: 0.3 mg/dL (ref 0.3–1.2)

## 2011-05-31 LAB — URINALYSIS, ROUTINE W REFLEX MICROSCOPIC
Ketones, ur: NEGATIVE mg/dL
Leukocytes, UA: NEGATIVE
Nitrite: NEGATIVE
Protein, ur: NEGATIVE mg/dL
Urobilinogen, UA: 0.2 mg/dL (ref 0.0–1.0)

## 2011-05-31 LAB — SURGICAL PCR SCREEN: Staphylococcus aureus: POSITIVE — AB

## 2011-06-01 NOTE — H&P (Signed)
Michele Rojas, Michele Rojas                 ACCOUNT NO.:  [id number]  MEDICAL RECORD NO.:  [id number]  LOCATION:  WLED                         FACILITY:  [facility]  PHYSICIAN:  Adolph Pollack, M.D.DATE OF BIRTH:  Feb 06, 1986  DATE OF ADMISSION:  05/30/2011 DATE OF DISCHARGE:                             HISTORY & PHYSICAL   REASON FOR ADMISSION:  Cholecystitis.  HISTORY:  This is a 25 year old female who awoke Saturday night with pressure-type right upper quadrant pain.  She had fried shrimp for dinner.  The pain progressively worsened and was associated with nausea. She took a laxative and had a bowel movement, but the pain persisted. She tried an antacid, but the pain persisted and continued to worsen and was associated with nausea.  Subsequently, she presented to the emergency department on Sunday night for evaluation.  She was noted a heavy pop with a positive Murphy sign on physical examination at that time.  Ultrasound was performed demonstrating a gallstone lodged in the gallbladder neck, but no pericholecystic fluid and no gallbladder wall thickening.  She did have an elevation of her white blood cell count. We were asked to see her to evaluate her for acute cholecystitis.  PAST MEDICAL HISTORY: 1. Depression. 2. Migraine headaches.  PREVIOUS OPERATIONS:  Bilateral Achilles tendon lengthening.  ALLERGIES:  AMOXICILLIN causes oral thrush.  MEDICATIONS:  Wellbutrin, Lamictal, Topamax.  SOCIAL HISTORY:  She is a Consulting civil engineer.  She is a former smoker.  Has occasional alcoholic beverage.  She is here with her mom.  FAMILY HISTORY:  Positive for hypertension.  REVIEW OF SYSTEMS:  CARDIAC:  No heart disease or hypertension. PULMONARY:  No asthma or pneumonia.  GI:  No peptic ulcer disease or hepatitis.  GU:  No kidney stones or hematuria.  ENDOCRINE:  No diabetes or hypercholesterolemia.  NEUROLOGIC:  No seizures.  HEMATOLOGIC:  No bleeding disorders or blood clots.  PHYSICAL  EXAMINATION:  GENERAL:  Well-developed, well-nourished female in no acute distress, pleasant and cooperative. VITAL SIGNS:  Temperature is 99.7, heart rate 63, blood pressure 129/55, respirations 18, O2 sats 97%. EYES:  Extraocular motions intact.  No icterus. MOUTH:  Mucous membranes are dry. NECK:  Supple without mass. RESPIRATORY:  Breath sounds are equal and clear.  Respirations are unlabored. CARDIOVASCULAR:  Regular rate, regular rhythm.  No murmur. ABDOMEN:  Soft.  A naval ring is present.  There is mild tenderness to the right upper quadrant to palpation.  No hernias or masses. SKIN:  No jaundice. MUSCULOSKELETAL:  Good muscle tone.  No edema.  LABORATORY DATA:  Glucose is 110.  Other than that electrolytes are within normal limits.  Liver function tests normal.  White cell count 12,500.  Hemoglobin 14.3.  Urine pregnancy test negative.  Ultrasound was reviewed.  IMPRESSION:  Evolving acute cholecystitis.  PLAN:  Admit to the hospital.  IV fluid hydration.  Start IV antibiotics.  Laparoscopic possible open cholecystectomy this admission. I have discussed the procedure and risks with her.  Risks include, but are not limited to bleeding, infection, wound healing problems, anesthesia, diarrhea, bile leak, accidental injury to the common bile duct/liver/intestine.  She seems to understand all this and agrees with  the plan.     Adolph Pollack, M.D.     Kari Baars  D:  05/31/2011  T:  05/31/2011  Job:  [id number]  Electronically Signed by Avel Peace M.D. on 06/01/2011 08:30:01 AM

## 2011-06-02 NOTE — Op Note (Signed)
NAMEIVONNE, FREEBURG                 ACCOUNT NO.:  [id number]  MEDICAL RECORD NO.:  [id number]  LOCATION:  1533                         FACILITY:  [facility]  PHYSICIAN:  Juanetta Gosling, MDDATE OF BIRTH:  [birth year]  DATE OF PROCEDURE:  05/31/2011 DATE OF DISCHARGE:                              OPERATIVE REPORT   PREOPERATIVE DIAGNOSIS:  Acute cholecystitis.  POSTOPERATIVE DIAGNOSIS:  Acute cholecystitis.  PROCEDURE:  Laparoscopic cholecystectomy.  SURGEON:  Juanetta Gosling, MD  ASSISTANT:  None.  ANESTHESIA:  General.  SPECIMENS:  Gallbladder contents to pathology.  ESTIMATED BLOOD LOSS:  Minimal.  COMPLICATIONS:  None.  DRAINS:  None.  DISPOSITION:  To recovery room in stable condition.  INDICATIONS:  A 25 year old female who on Saturday night developed right upper quadrant pain.  She has a mildly elevated white blood cell count and ultrasound revealed gallstone lodged in her gallbladder neck with a Murphy sign  in the emergency department.  We discussed a laparoscopic cholecystectomy with her and the risks and benefits associated with that.  DESCRIPTION OF PROCEDURE:  After informed consent was obtained, the patient was 1st administered ciprofloxacin due to some allergies.  She then had sequential compression devices placed.  She was then taken to the operating room, placed under general anesthesia without complication.  Her abdomen was then prepped and draped in standard sterile surgical fashion.  A surgical time-out was then performed.  I infiltrated 0.25% Marcaine below her umbilicus.  I then made a vertical incision at her umbilicus.  This was carried out down to the fascia.  Kocher clamps used to grab her umbilical stalk.  I then incised the fascia sharply.  The peritoneum was entered bluntly.  I placed a 0 Vicryl pursestring suture through the fascia.  Hasson trocar was introduced and the abdomen was then insufflated to 15 mmHg pressure.  I then  placed 3 further 5-mm ports in the epigastrium and right upper quadrant under direct vision without complication after infiltration with local anesthetic.  She was then placed in reverse Trendelenburg position.  I then grasped her gallbladder, which we did have acute cholecystitis in cephalad and lateral.  I dissected the triangle of Calot.  Eventually, I was able to obtain the critical view of safety.  I then divided the duct as well as the artery and left clips in place on them.  The gallbladder was then removed with some difficulty from the liver bed, it certainly did have acute cholecystitis.  I then placed the gallbladder in an EndoCatch bag.  This was removed from the umbilicus. Irrigation was performed.  Hemostasis was observed.  I then removed the Hasson trocar.  This was then tied down.  There was no evidence of any entry injury.  I then desufflated the abdomen and removed the remainder of the trocars.  These were closed with 4-0 Monocryl.  Dermabond was placed on them.  She tolerated this well, was extubated and transferred to recovery room in stable condition.     Juanetta Gosling, MD     MCW/MEDQ  D:  05/31/2011  T:  05/31/2011  Job:  [id number]  Electronically Signed by Emelia Loron MD on 06/02/2011  03:43:06 PM

## 2011-06-05 NOTE — Discharge Summary (Signed)
  [name], Michele Rojas                 ACCOUNT NO.:  [id number]  MEDICAL RECORD NO.:  [id number]  LOCATION:  1533                         FACILITY:  [facility]  PHYSICIAN:  Juanetta Gosling, MDDATE OF BIRTH:  February 16, 1986  DATE OF ADMISSION:  05/30/2011 DATE OF DISCHARGE:  06/01/2011                              DISCHARGE SUMMARY   PROCEDURE:  Laparoscopic cholecystectomy, May 31, 2011.  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  It became progressively worse and associated with nausea.  She had no relief from laxative or an antacid.  She had a Murphy's sign in the ED with gallstones on her ultrasound lodged in the neck.  She was seen in consultation at the early a.m. of May 31, 2011, by Dr. Avel Peace and admitted for acute cholecystitis.  PAST MEDICAL HISTORY:  Includes: 1. Depression. 2. Migraines. For further history and physical, please see the dictated note.  HOSPITAL COURSE:  Patient was admitted, placed on antibiotics by Dr. Abbey Chatters.  She was seen that morning by Dr. Dwain Sarna.  He took her to the OR later that day and she underwent laparoscopic cholecystectomy. She tolerated the procedure well and returned to the floor in satisfactory condition.  First postoperative day, she was mobilized without difficulty.  Her diet was increased.  Her incisions looked good. It was Dr. Doreen Salvage opinion that she could be discharged home after lunch.  She has discharge instructions to follow up in 2 to 3 weeks. Instructions include for postoperative laparoscopic cholecystectomy to clean her wounds with plain soap and water.  Call if she has any problems with pain, fever, redness, or drainage from her sites.  DISCHARGE MEDICATIONS:  Will include her preadmission medicines which are: 1. Wellbutrin 100 mg b.i.d. 2. Topamax 3 tablets at bedtime. 3. Birth control pill, norethindrone acetate 1/20  daily. 4. Lamictal 25 mg daily. 5. Percocet 1 to 2 p.o. q.4 h. p.r.n.  CONDITION ON DISCHARGE:  Improved.  FINAL DIAGNOSIS:  Acute cholecystitis.     Eber Hong, P.A.   ______________________________ Juanetta Gosling, MD    WDJ/MEDQ  D:  06/01/2011  T:  06/02/2011  Job:  [id number]  Electronically Signed by Sherrie George P.A. on 06/03/2011 08:47:36 PM Electronically Signed by Emelia Loron MD on 06/05/2011 11:35:22 AM

## 2011-10-04 ENCOUNTER — Ambulatory Visit (INDEPENDENT_AMBULATORY_CARE_PROVIDER_SITE_OTHER): Payer: BC Managed Care – PPO | Admitting: Family Medicine

## 2011-10-04 ENCOUNTER — Encounter: Payer: Self-pay | Admitting: Family Medicine

## 2011-10-04 VITALS — BP 110/72 | HR 80 | Temp 98.5°F | Ht 66.0 in | Wt 157.2 lb

## 2011-10-04 DIAGNOSIS — F17200 Nicotine dependence, unspecified, uncomplicated: Secondary | ICD-10-CM

## 2011-10-04 DIAGNOSIS — F3289 Other specified depressive episodes: Secondary | ICD-10-CM

## 2011-10-04 DIAGNOSIS — F329 Major depressive disorder, single episode, unspecified: Secondary | ICD-10-CM

## 2011-10-04 DIAGNOSIS — R5381 Other malaise: Secondary | ICD-10-CM

## 2011-10-04 DIAGNOSIS — F32A Depression, unspecified: Secondary | ICD-10-CM | POA: Insufficient documentation

## 2011-10-04 DIAGNOSIS — R5383 Other fatigue: Secondary | ICD-10-CM

## 2011-10-04 NOTE — Assessment & Plan Note (Signed)
Disc in detail risks of smoking and possible outcomes including copd, vascular/ heart disease, cancer , respiratory and sinus infections  Pt voices understanding Did also warn her that OC with smoking is dangerous - and she plans to talk to her gyn about this

## 2011-10-04 NOTE — Progress Notes (Signed)
Subjective:    Patient ID: Michele Rojas, female    DOB: 03/14/1986, 26 y.o.   MRN: [id number]  HPI Here for fatigue/ depression issues  Is having a lot of fatigue  Discussed depression in the past  Is being treated for it  Very difficult being tired all the time   Is currently on celexa  and wellbutrin and klonopin (as needed) Was in control for a while - then changed meds Also stress with school  Started getting worse again 2-3 months ago , has never noticed seasonal changes in the past  Sees Dr Marciano Sequin at [facility]  Is supposed to call her soon  Saw 1 mo ago - was off zoloft completely -and was just on wellbutrin -- then tried celexa (total of 20 mg)  No side eff   Initially was seeing a counselor - but insurance did not pay for it  Did seem to help   Also topamax-- is still on that , and it does help migraines  Has been on it for a while   Wt is down 17 lb  Eating habits has changed -- snacking more than eating meals / also more veggies Not going out to eat as much (less fast food)   School is very stressful   She has been fatigued for years and years  Lab Results  Component Value Date   TSH 3.14 10/06/2009    Lab Results  Component Value Date   WBC 12.5* 05/31/2011   HGB 14.3 05/31/2011   HCT 41.5 05/31/2011   MCV 89.1 05/31/2011   PLT 206 05/31/2011   has fam hx of B12 low and also thyroid   She had emergent gb surgery -- in fall , and had to return to job/ school too soon -- this was hard on her   Patient Active Problem List  Diagnoses  . DEFICIENCY OF OTHER VITAMINS  . MOOD SWINGS  . TOBACCO USE  . OTHER CONSTIPATION  . FATIGUE  . HEADACHE  . RLQ PAIN  . TRANSAMINASES, SERUM, ELEVATED  . CHICKENPOX, HX OF  . Diarrhea  . Conjunctivitis  . Depression   Past Medical History  Diagnosis Date  . Tobacco abuse   . Fatigue   . Constipation   . History of chicken pox as a child   No past surgical history on file. History  Substance Use Topics  .  Smoking status: Current Everyday Smoker    Last Attempt to Quit: 05/09/2010  . Smokeless tobacco: Never Used  . Alcohol Use: Yes     socially   Family History  Problem Relation Age of Onset  . Depression Mother     after a car accident  . Hypertension Father   . Hyperlipidemia Father   . Aneurysm Maternal Grandfather   . Alzheimer's disease Other   . Depression Brother     major depression   Allergies  Allergen Reactions  . Amoxicillin     REACTION: yeast infection in mouth  . Hydrocodone     REACTION: itchy   Current Outpatient Prescriptions on File Prior to Visit  Medication Sig Dispense Refill  . buPROPion (WELLBUTRIN XL) 150 MG 24 hr tablet Take 300 mg by mouth daily.       Marland Kitchen levonorgestrel-ethinyl estradiol (ALTAVERA) 0.15-30 MG-MCG per tablet Take 1 tablet by mouth daily.        Marland Kitchen topiramate (TOPAMAX) 25 MG tablet Take 75 mg by mouth at bedtime.       Marland Kitchen  baclofen (LIORESAL) 10 MG tablet Take one by mouth as needed for headache       . GRAPE SEED PO Take by mouth daily. [place]       . loratadine (CLARITIN) 10 MG tablet Take 10 mg by mouth daily as needed.        . sertraline (ZOLOFT) 100 MG tablet Take 100 mg by mouth daily.            Review of Systems Review of Systems  Constitutional: Negative for fever,  And pos for fatigue and wt loss   Eyes: Negative for pain and visual disturbance.  Respiratory: Negative for cough and shortness of breath.   Cardiovascular: Negative for cp or palpitations    Gastrointestinal: Negative for nausea, diarrhea and constipation.  Genitourinary: Negative for urgency and frequency.  Skin: Negative for pallor or rash   Neurological: Negative for weakness, light-headedness, numbness and headaches.  Hematological: Negative for adenopathy. Does not bruise/bleed easily.  Psychiatric/Behavioral: pos for depression and anxiety / no SI          Objective:   Physical Exam  Constitutional: She appears well-developed and  well-nourished. No distress.  HENT:  Head: Normocephalic and atraumatic.  Right Ear: External ear normal.  Left Ear: External ear normal.  Nose: Nose normal.  Mouth/Throat: Oropharynx is clear and moist.  Eyes: Conjunctivae and EOM are normal. Pupils are equal, round, and reactive to light. No scleral icterus.  Neck: Normal range of motion. Neck supple. Carotid bruit is not present. Erythema present. No thyromegaly present.  Cardiovascular: Normal rate, regular rhythm, normal heart sounds and intact distal pulses.   No murmur heard. Pulmonary/Chest: Effort normal and breath sounds normal. No respiratory distress. She has no wheezes.       Slightly distant bs  No wheeze  Abdominal: Soft. Bowel sounds are normal. She exhibits no distension and no mass. There is no tenderness.  Musculoskeletal: Normal range of motion. She exhibits no edema and no tenderness.       No acute joint changes  Lymphadenopathy:    She has no cervical adenopathy.  Neurological: She is alert. She has normal reflexes. She displays no tremor. No cranial nerve deficit. She exhibits normal muscle tone. Coordination normal.  Skin: Skin is warm and dry. No rash noted. No erythema. No pallor.  Psychiatric: Judgment and thought content normal. Her mood appears anxious. Her affect is blunt. Her affect is not labile and not inappropriate. Her speech is delayed. She is slowed. Cognition and memory are normal. She exhibits a depressed mood. She expresses no homicidal and no suicidal ideation.       [employer] systems engineer Generally vegetative behavior  She is attentive.          Assessment & Plan:

## 2011-10-04 NOTE — Assessment & Plan Note (Signed)
This seems quite a bit worse- with inc in stressors and also recent change in med Likely needs celexa inc-she will contact her psychiatrist  Also counseling - cannot afford (perhaps school or church options) Disc stressors in detail along with coping mech and symptoms/ pos tx opt >25 min spent with face to face with patient, >50% counseling and/or coordinating care

## 2011-10-04 NOTE — Assessment & Plan Note (Signed)
Ongoing long term (with some wt loss)  I feel this may be strongly linked to depression and stress reaction Will check lab today  And update  rec return to psychiatrist and also investigate free counseling options

## 2011-10-04 NOTE — Patient Instructions (Signed)
We will do labs today  Touch base with your psychiatrist  I feel strongly that you would benefit from counseling - check out options at your school or church that may be free  Update if not starting to improve in a week or if worsening

## 2011-10-05 LAB — COMPREHENSIVE METABOLIC PANEL
ALT: 49 U/L — ABNORMAL HIGH (ref 0–35)
AST: 35 U/L (ref 0–37)
Alkaline Phosphatase: 64 U/L (ref 39–117)
Creatinine, Ser: 0.8 mg/dL (ref 0.4–1.2)
Sodium: 138 mEq/L (ref 135–145)
Total Bilirubin: 0.4 mg/dL (ref 0.3–1.2)
Total Protein: 6.6 g/dL (ref 6.0–8.3)

## 2011-10-05 LAB — CBC WITH DIFFERENTIAL/PLATELET
Basophils Absolute: 0.1 10*3/uL (ref 0.0–0.1)
Eosinophils Absolute: 0.1 10*3/uL (ref 0.0–0.7)
HCT: 44.7 % (ref 36.0–46.0)
Lymphs Abs: 4.2 10*3/uL — ABNORMAL HIGH (ref 0.7–4.0)
MCHC: 33.5 g/dL (ref 30.0–36.0)
MCV: 91.8 fl (ref 78.0–100.0)
Monocytes Absolute: 0.7 10*3/uL (ref 0.1–1.0)
Monocytes Relative: 5.9 % (ref 3.0–12.0)
Platelets: 176 10*3/uL (ref 150.0–400.0)
RDW: 12.9 % (ref 11.5–14.6)

## 2011-10-07 ENCOUNTER — Telehealth: Payer: Self-pay | Admitting: Family Medicine

## 2011-10-07 MED ORDER — LEVOTHYROXINE SODIUM 25 MCG PO TABS: 25.0000 ug | ORAL_TABLET | Freq: Every day | ORAL | Status: DC

## 2011-10-07 NOTE — Telephone Encounter (Signed)
b12 is low tsh high- poss early hypothyroid - these could both cause fatigue Wbc is a little high - but baseline for her  I want her to get 4 B12 shots in 4 weeks Also start low dose synthroid- Px written for call in   F/u with me in 5 weeks

## 2011-10-08 MED ORDER — LEVOTHYROXINE SODIUM 25 MCG PO TABS: 25.0000 ug | ORAL_TABLET | Freq: Every day | ORAL | Status: DC

## 2011-10-08 NOTE — Telephone Encounter (Signed)
Patient notified as instructed by telephone. Pt said she will need to ck with insurance co and call back next week to schedule B12 injections weekly and appt with Dr Milinda Antis in 5 weeks. Medication sent electronically to [facility] pharmacy as instructed.

## 2011-10-11 ENCOUNTER — Ambulatory Visit (INDEPENDENT_AMBULATORY_CARE_PROVIDER_SITE_OTHER): Payer: BC Managed Care – PPO | Admitting: *Deleted

## 2011-10-11 ENCOUNTER — Ambulatory Visit: Payer: BC Managed Care – PPO

## 2011-10-11 DIAGNOSIS — E538 Deficiency of other specified B group vitamins: Secondary | ICD-10-CM

## 2011-10-11 MED ORDER — CYANOCOBALAMIN 1000 MCG/ML IJ SOLN
1000.0000 ug | Freq: Once | INTRAMUSCULAR | Status: AC
  Administered 2011-10-11: 1000 ug via INTRAMUSCULAR

## 2011-10-15 ENCOUNTER — Ambulatory Visit: Payer: BC Managed Care – PPO

## 2011-10-18 ENCOUNTER — Ambulatory Visit (INDEPENDENT_AMBULATORY_CARE_PROVIDER_SITE_OTHER): Payer: BC Managed Care – PPO | Admitting: *Deleted

## 2011-10-18 DIAGNOSIS — E538 Deficiency of other specified B group vitamins: Secondary | ICD-10-CM

## 2011-10-18 MED ORDER — CYANOCOBALAMIN 1000 MCG/ML IJ SOLN
1000.0000 ug | Freq: Once | INTRAMUSCULAR | Status: AC
  Administered 2011-10-18: 1000 ug via INTRAMUSCULAR

## 2011-10-25 ENCOUNTER — Ambulatory Visit (INDEPENDENT_AMBULATORY_CARE_PROVIDER_SITE_OTHER): Payer: BC Managed Care – PPO

## 2011-10-25 DIAGNOSIS — E538 Deficiency of other specified B group vitamins: Secondary | ICD-10-CM

## 2011-10-25 MED ORDER — CYANOCOBALAMIN 1000 MCG/ML IJ SOLN
1000.0000 ug | Freq: Once | INTRAMUSCULAR | Status: AC
  Administered 2011-10-25: 1000 ug via INTRAMUSCULAR

## 2011-11-01 ENCOUNTER — Ambulatory Visit (INDEPENDENT_AMBULATORY_CARE_PROVIDER_SITE_OTHER): Payer: BC Managed Care – PPO

## 2011-11-01 DIAGNOSIS — E538 Deficiency of other specified B group vitamins: Secondary | ICD-10-CM

## 2011-11-01 MED ORDER — CYANOCOBALAMIN 1000 MCG/ML IJ SOLN
1000.0000 ug | Freq: Once | INTRAMUSCULAR | Status: AC
  Administered 2011-11-01: 1000 ug via INTRAMUSCULAR

## 2011-11-04 ENCOUNTER — Telehealth: Payer: Self-pay

## 2011-11-04 DIAGNOSIS — E039 Hypothyroidism, unspecified: Secondary | ICD-10-CM | POA: Insufficient documentation

## 2011-11-04 DIAGNOSIS — R5381 Other malaise: Secondary | ICD-10-CM

## 2011-11-04 NOTE — Telephone Encounter (Signed)
Patients mom advised as instructed via telephone.  Advised mom that [facility] will be contacting them regarding referral, if not today then it will be Monday before she gets in contact with them.

## 2011-11-04 NOTE — Telephone Encounter (Signed)
I will go ahead with referral  Also make sure that her mother alerts her psychiatrist of these symptoms as well

## 2011-11-04 NOTE — Telephone Encounter (Signed)
Michele Rojas pts mother said pt diagnosed with low thyroid and low B12. Pt cries all the time and has extreme fatigue. Wants ASAP referral to Dr Talmage Coin, specialist in thyroid problems and is at [facility]. Pt last seen 10/04/11. Pt can go today or Monday at 3:30 or any Friday. Please call Michele Rojas at [phone].

## 2011-11-15 ENCOUNTER — Encounter: Payer: Self-pay | Admitting: Family Medicine

## 2011-11-15 ENCOUNTER — Ambulatory Visit (INDEPENDENT_AMBULATORY_CARE_PROVIDER_SITE_OTHER): Payer: BC Managed Care – PPO | Admitting: Family Medicine

## 2011-11-15 VITALS — BP 120/70 | HR 90 | Temp 97.9°F | Ht 66.0 in | Wt 155.5 lb

## 2011-11-15 DIAGNOSIS — F39 Unspecified mood [affective] disorder: Secondary | ICD-10-CM

## 2011-11-15 DIAGNOSIS — J029 Acute pharyngitis, unspecified: Secondary | ICD-10-CM

## 2011-11-15 DIAGNOSIS — E039 Hypothyroidism, unspecified: Secondary | ICD-10-CM

## 2011-11-15 DIAGNOSIS — E568 Deficiency of other vitamins: Secondary | ICD-10-CM

## 2011-11-15 DIAGNOSIS — R5381 Other malaise: Secondary | ICD-10-CM

## 2011-11-15 NOTE — Progress Notes (Signed)
Subjective:    Patient ID: Michele Rojas, female    DOB: 1986/06/24, 26 y.o.   MRN: [id number]  HPI Here for f/u of depression/ fatigue / B12 and hypothyroid   Feels about the same   Started a sore throat last night - scratchy and thinks she had a fever  Took ibuprofen today  Throat is red - no white patches  Is in clinicals at hospital  Sinuses feel full  Not really any post nasal drip   Has seen Dr Marciano Sequin in past for psychiatry At last visit could not afford counseling  Does not have appt to go back to psychiatrist - but placed a call to call back  Is tearful , and hopelessness, and lack of joy   Stress- at school - is getting ready to finish  Is doing well overall - that is exciting     Was ref to Dr Sharl Ma -endo for the hypothyroid and B12-- got shots -- has endo appt the 22nd  On synthroid 25 mcg now for mildly high tsh Lab Results  Component Value Date   TSH 5.65* 10/04/2011     Wt is down 2 lb with bmi of 25 Appetite generally down  Patient Active Problem List  Diagnoses  . DEFICIENCY OF OTHER VITAMINS  . MOOD SWINGS  . TOBACCO USE  . OTHER CONSTIPATION  . FATIGUE  . HEADACHE  . RLQ PAIN  . TRANSAMINASES, SERUM, ELEVATED  . CHICKENPOX, HX OF  . Diarrhea  . Conjunctivitis  . Depression  . Hypothyroid  . Sore throat   Past Medical History  Diagnosis Date  . Tobacco abuse   . Fatigue   . Constipation   . History of chicken pox as a child   No past surgical history on file. History  Substance Use Topics  . Smoking status: Current Everyday Smoker    Last Attempt to Quit: 05/09/2010  . Smokeless tobacco: Never Used  . Alcohol Use: Yes     socially   Family History  Problem Relation Age of Onset  . Depression Mother     after a car accident  . Hypertension Father   . Hyperlipidemia Father   . Aneurysm Maternal Grandfather   . Alzheimer's disease Other   . Depression Brother     major depression   Allergies  Allergen Reactions  .  Amoxicillin     REACTION: yeast infection in mouth  . Hydrocodone     REACTION: itchy   Current Outpatient Prescriptions on File Prior to Visit  Medication Sig Dispense Refill  . baclofen (LIORESAL) 10 MG tablet Take one by mouth as needed for headache       . buPROPion (WELLBUTRIN XL) 150 MG 24 hr tablet Take 300 mg by mouth daily.       . citalopram (CELEXA) 10 MG tablet Take 1 tablet by mouth At bedtime.      . clonazePAM (KLONOPIN) 0.5 MG tablet Take 1 tablet by mouth daily as needed.      Marland Kitchen GRAPE SEED PO Take by mouth daily. [place]       . levonorgestrel-ethinyl estradiol (ALTAVERA) 0.15-30 MG-MCG per tablet Take 1 tablet by mouth daily.        Marland Kitchen levothyroxine (SYNTHROID) 25 MCG tablet Take 1 tablet (25 mcg total) by mouth daily.  30 tablet  2  . loratadine (CLARITIN) 10 MG tablet Take 10 mg by mouth daily as needed.        . topiramate (  TOPAMAX) 25 MG tablet Take 75 mg by mouth at bedtime.            Review of Systems Review of Systems  Constitutional: Negative for fever, appetite change,  and pos for severe fatigue  Eyes: Negative for pain and visual disturbance.  ENT pos for mild ST and rhinorrhea/ no ear pain  Respiratory: Negative for cough and shortness of breath.   Cardiovascular: Negative for cp or palpitations    Gastrointestinal: Negative for nausea, diarrhea and constipation.  Genitourinary: Negative for urgency and frequency.  Skin: Negative for pallor or rash   Neurological: Negative for weakness, light-headedness, numbness and headaches.  Hematological: Negative for adenopathy. Does not bruise/bleed easily.  Psychiatric/Behavioral: pos for vegetative depressive symptoms, neg for anxiety , neg for SI        Objective:   Physical Exam  Constitutional: She appears well-developed and well-nourished. No distress.       Fatigued and depressed appearing female  HENT:  Head: Normocephalic and atraumatic.  Right Ear: External ear normal.  Left Ear: External  ear normal.  Mouth/Throat: Oropharynx is clear and moist. No oropharyngeal exudate.       Nares are boggy with clear rhinorrhea  Clear throat   Eyes: Right eye exhibits no discharge. Left eye exhibits no discharge. No scleral icterus.  Neck: Normal range of motion. Neck supple.  Cardiovascular: Normal rate and regular rhythm.   Pulmonary/Chest: Effort normal and breath sounds normal. No respiratory distress. She has no wheezes.  Abdominal: Soft. Bowel sounds are normal. She exhibits no distension and no mass. There is no tenderness.       No HSM   Musculoskeletal: She exhibits no edema and no tenderness.       No acute joint changes   Lymphadenopathy:    She has no cervical adenopathy.  Neurological: She is alert. She has normal reflexes. She displays no tremor. No cranial nerve deficit. Coordination normal.  Skin: Skin is warm and dry. No rash noted. No erythema. No pallor.  Psychiatric: Judgment normal. Her affect is blunt. Her affect is not inappropriate. Her speech is delayed. Her speech is not slurred. She is slowed and withdrawn. Cognition and memory are normal. She exhibits a depressed mood. She expresses no homicidal and no suicidal ideation.       Generally vegetative with psychomotor slowing           Assessment & Plan:

## 2011-11-15 NOTE — Assessment & Plan Note (Signed)
slt high tsh No sympt imp on synthroid so far F/u upcoming with endocrine for this

## 2011-11-15 NOTE — Patient Instructions (Signed)
Make sure to talk to your psychiatrist about depression and what to do next  Also try to stay active and eat healthy diet  Follow up with the endocrinologist as planned - for thyroid - they will do the next check  Until then -continue the present medications  Get B12 over the counter (supplement) - aim for 500 mcg once daily  I think your sore throat and fever may be viral (or the beginning of a head cold)  Get some aleve- 2 pills twice daily with food - for fever and aches and sore throat Lots of fluids  Try to get some rest  Chloraseptic throat spray is also helpful  Update if not starting to improve in a week or if worsening

## 2011-11-15 NOTE — Assessment & Plan Note (Signed)
I suspect this is mostly depression related Will touch base with her psychiatrist asap - likely will adj med No SI  Disc stressors

## 2011-11-15 NOTE — Assessment & Plan Note (Signed)
Has had B12 shots and now will try otc oral supplementation to see if this helps Energy level not really better Will be seeing endocrinology soon

## 2011-11-19 ENCOUNTER — Telehealth: Payer: Self-pay | Admitting: Family Medicine

## 2011-11-19 NOTE — Telephone Encounter (Signed)
Triage Record Num: [id number] Operator: Aundra Millet Patient Name: Michele Rojas Call Date & Time: 11/18/2011 5:18:55PM Patient Phone: [phone] PCP: Audrie Gallus. [place] Patient Gender: Female PCP Fax : Patient DOB: 02-08-1986 Practice Name: [facility] Reason for Call: Caller: Jannett/Mother; PCP: Roxy Manns A.; CB#: [phone]; ; ; Call regarding Cough/Congestion; On 11/14/2011 had onset sore throat and felt feverish and had OV 11/15/2011. Neg for strep and advised viral cold and take Aleve . Since OV has continued to have sore throat with dry cough. Cough worse during day. No nasal congestion. No more fever. RN reached See in 72 hrs - evaluated by provider and no improvenment with sx's after treatment plan per Cough - Adult. RN adv may try Mucinex per pkg instructions. Protocol(s) Used: Cough - Adult Recommended Outcome per Protocol: See Provider within 72 Hours Reason for Outcome: Evaluated by provider AND no improvement in symptoms after following treatment plan for the time specified by provider Care Advice: ~ Use a cool mist humidifier to moisten air. Be sure to clean according to manufacturer's instructions. ~ Continue to follow treatment plan, including medications, until evaluated by provider. ~ SYMPTOM / CONDITION MANAGEMENT Most adults need to drink 6-10 eight-ounce glasses (1.2-2.0 liters) of fluids per day unless previously told to limit fluid intake for other medical reasons. Limit fluids that contain caffeine, sugar or alcohol. Urine will be a very light yellow color when you drink enough fluids. ~ 11/18/2011 5:29:30PM Page 1 of 1 [id number]

## 2011-11-19 NOTE — Telephone Encounter (Signed)
Patient advised as instructed via telephone. 

## 2011-11-19 NOTE — Telephone Encounter (Signed)
Keep doing what you are doing - not unusual to keep this cough after viral uri- see how it goes over the weekend (watching for fever) Update me next week if not imp further

## 2011-11-21 NOTE — Assessment & Plan Note (Signed)
Reassuring exam May be beginnings of a uri  Will update if worse  Consider mono test if worse and symptoms continue

## 2012-11-06 ENCOUNTER — Other Ambulatory Visit: Payer: Self-pay

## 2013-09-28 ENCOUNTER — Ambulatory Visit (INDEPENDENT_AMBULATORY_CARE_PROVIDER_SITE_OTHER): Payer: Commercial Indemnity | Admitting: Family Medicine

## 2013-09-28 ENCOUNTER — Encounter: Payer: Self-pay | Admitting: Family Medicine

## 2013-09-28 VITALS — BP 126/82 | HR 100 | Temp 98.6°F | Ht 66.5 in | Wt 169.0 lb

## 2013-09-28 DIAGNOSIS — E039 Hypothyroidism, unspecified: Secondary | ICD-10-CM

## 2013-09-28 DIAGNOSIS — Z13220 Encounter for screening for lipoid disorders: Secondary | ICD-10-CM | POA: Insufficient documentation

## 2013-09-28 DIAGNOSIS — Z131 Encounter for screening for diabetes mellitus: Secondary | ICD-10-CM

## 2013-09-28 DIAGNOSIS — Z008 Encounter for other general examination: Secondary | ICD-10-CM

## 2013-09-28 DIAGNOSIS — Z0189 Encounter for other specified special examinations: Secondary | ICD-10-CM

## 2013-09-28 DIAGNOSIS — Z23 Encounter for immunization: Secondary | ICD-10-CM

## 2013-09-28 DIAGNOSIS — F17200 Nicotine dependence, unspecified, uncomplicated: Secondary | ICD-10-CM

## 2013-09-28 LAB — LDL CHOLESTEROL, DIRECT: LDL DIRECT: 172.4 mg/dL

## 2013-09-28 LAB — LIPID PANEL
Cholesterol: 219 mg/dL — ABNORMAL HIGH (ref 0–200)
HDL: 34.4 mg/dL — ABNORMAL LOW (ref >39.00)
Total CHOL/HDL Ratio: 6
Triglycerides: 77 mg/dL (ref 0.0–149.0)
VLDL: 15.4 mg/dL (ref 0.0–40.0)

## 2013-09-28 LAB — GLUCOSE, RANDOM: Glucose, Bld: 99 mg/dL (ref 70–99)

## 2013-09-28 NOTE — Progress Notes (Signed)
Subjective:    Patient ID: Michele Rojas, female    DOB: [birth year], 28 y.o.   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 - insurance  She has not been able to find work herself - is frustrated  She went to school for MRI (no jobs)- or admin/ clerical   It requires her vitals/ wt /ht bmi and cholesterol/ glucose  No family history of DM that she knows of  Some high chol in family     BP Readings from Last 3 Encounters:  09/28/13 126/82  11/15/11 120/70  10/04/11 110/72   Her diet has been slack -convenience eating - a fair amount of fat in diet No regular exercise - active lifestyle   Wt is up 15 lb with bmi of 26  Hypothyroid - has not been back to see her thyroid doctor and has not been on med  She does not want to go back Was on a very low dose  She would rather not check thyroid labs anymore   Smoking - quit for a while - 3 months  Started back when stress level got high (thought she could smoke just one)  Not quite ready to quit again  She does want to get pregnant upcoming   Mood- is good with her wellbutrin   Td - ? When her last one was  Flu vaccine - did not get one this season  (does not decline)  Pap smear 3/14 - goes to GYN - Dr Deatra Ina    Patient Active Problem List   Diagnosis Date Noted  . Sore throat 11/15/2011  . Hypothyroid 11/04/2011  . Depression 10/04/2011  . Conjunctivitis 05/21/2011  . Diarrhea 01/19/2011  . RLQ PAIN 03/20/2010  . HEADACHE 11/18/2009  . MOOD SWINGS 11/17/2009  . TRANSAMINASES, SERUM, ELEVATED 10/13/2009  . FATIGUE 10/06/2009  . TOBACCO USE 02/19/2009  . CHICKENPOX, HX OF 02/19/2009  . DEFICIENCY OF OTHER VITAMINS 04/29/2008  . OTHER CONSTIPATION 04/29/2008   Past Medical History  Diagnosis Date  . Tobacco abuse   . Fatigue   . Constipation   . History of chicken pox as a child   No past surgical history on file. History   Substance Use Topics  . Smoking status: Current Every Day Smoker -- 0.75 packs/day    Types: Cigarettes    Last Attempt to Quit: 05/09/2010  . Smokeless tobacco: Never Used  . Alcohol Use: Yes     Comment: socially   Family History  Problem Relation Age of Onset  . Depression Mother     after a car accident  . Hypertension Father   . Hyperlipidemia Father   . Aneurysm Maternal Grandfather   . Alzheimer's disease Other   . Depression Brother     major depression   Allergies  Allergen Reactions  . Amoxicillin     REACTION: yeast infection in mouth  . Hydrocodone     REACTION: itchy   Current Outpatient Prescriptions on File Prior to Visit  Medication Sig Dispense Refill  . buPROPion (WELLBUTRIN XL) 150 MG 24 hr tablet Take 300 mg by mouth daily.       Marland Kitchen loratadine (CLARITIN) 10 MG tablet Take 10 mg by mouth daily as needed.         No current facility-administered medications on file prior to visit.    Review of Systems Review of Systems  Constitutional: Negative for fever, appetite change, fatigue and unexpected weight change.  Eyes: Negative for pain and visual disturbance.  Respiratory: Negative for cough and shortness of breath.   Cardiovascular: Negative for cp or palpitations    Gastrointestinal: Negative for nausea, diarrhea and constipation.  Genitourinary: Negative for urgency and frequency.  Skin: Negative for pallor or rash   Neurological: Negative for weakness, light-headedness, numbness and headaches.  Hematological: Negative for adenopathy. Does not bruise/bleed easily.  Psychiatric/Behavioral: Negative for dysphoric mood. The patient is not nervous/anxious.         Objective:   Physical Exam  Constitutional: She appears well-developed and well-nourished. No distress.  HENT:  Head: Normocephalic and atraumatic.  Mouth/Throat: Oropharynx is clear and moist.  Eyes: Conjunctivae and EOM are normal. Pupils are equal, round, and reactive to light. No  scleral icterus.  Neck: Normal range of motion. Neck supple. No JVD present. Carotid bruit is not present. No thyromegaly present.  Cardiovascular: Normal rate, regular rhythm, normal heart sounds and intact distal pulses.  Exam reveals no gallop.   Pulmonary/Chest: Effort normal and breath sounds normal. No respiratory distress. She has no wheezes. She exhibits no tenderness.  Diffusely distant bs   Abdominal: Soft. Bowel sounds are normal. She exhibits no distension, no abdominal bruit and no mass. There is no tenderness.  Musculoskeletal: Normal range of motion. She exhibits no edema and no tenderness.  Lymphadenopathy:    She has no cervical adenopathy.  Neurological: She is alert. She has normal reflexes. No cranial nerve deficit. She exhibits normal muscle tone. Coordination normal.  Skin: Skin is warm and dry. No rash noted. No erythema. No pallor.  Psychiatric: She has a normal mood and affect.          Assessment & Plan:

## 2013-09-28 NOTE — Patient Instructions (Signed)
Tdap vaccine today  Keep thinking about quitting smoking  I will finish form when your labs return

## 2013-09-28 NOTE — Progress Notes (Signed)
Pre visit review using our clinic review tool, if applicable. No additional management support is needed unless otherwise documented below in the visit note. 

## 2013-09-30 DIAGNOSIS — Z0189 Encounter for other specified special examinations: Principal | ICD-10-CM

## 2013-09-30 DIAGNOSIS — Z008 Encounter for other general examination: Secondary | ICD-10-CM | POA: Insufficient documentation

## 2013-09-30 NOTE — Assessment & Plan Note (Signed)
For insurance Forms filled out -pending lab

## 2013-09-30 NOTE — Assessment & Plan Note (Signed)
Glucose today  No symptoms  For biometric screen

## 2013-09-30 NOTE — Assessment & Plan Note (Signed)
Lipids drawn for biometric screen Disc goals for lipids and reasons to control them Rev low sat fat diet in detail

## 2013-09-30 NOTE — Assessment & Plan Note (Signed)
Disc in detail risks of smoking and possible outcomes including copd, vascular/ heart disease, cancer , respiratory and sinus infections  Pt voices understanding She is not ready to quit yet  

## 2013-10-01 ENCOUNTER — Telehealth: Payer: Self-pay | Admitting: Family Medicine

## 2013-10-01 ENCOUNTER — Encounter: Payer: Self-pay | Admitting: *Deleted

## 2013-10-01 NOTE — Telephone Encounter (Signed)
Relevant patient education assigned to patient using Emmi. ° °

## 2013-12-24 ENCOUNTER — Other Ambulatory Visit: Payer: Commercial Indemnity

## 2013-12-25 ENCOUNTER — Ambulatory Visit: Payer: Commercial Indemnity | Admitting: Family Medicine

## 2013-12-27 LAB — OB RESULTS CONSOLE GC/CHLAMYDIA
CHLAMYDIA, DNA PROBE: NEGATIVE
Gonorrhea: NEGATIVE

## 2014-01-22 LAB — OB RESULTS CONSOLE RUBELLA ANTIBODY, IGM: RUBELLA: IMMUNE

## 2014-01-22 LAB — OB RESULTS CONSOLE RPR: RPR: NONREACTIVE

## 2014-01-22 LAB — OB RESULTS CONSOLE HEPATITIS B SURFACE ANTIGEN: HEP B S AG: NEGATIVE

## 2014-01-22 LAB — OB RESULTS CONSOLE HIV ANTIBODY (ROUTINE TESTING): HIV: NONREACTIVE

## 2014-05-10 LAB — OB RESULTS CONSOLE ABO/RH: RH Type: NEGATIVE

## 2014-05-10 LAB — OB RESULTS CONSOLE ANTIBODY SCREEN: Antibody Screen: NEGATIVE

## 2014-05-28 ENCOUNTER — Inpatient Hospital Stay (HOSPITAL_COMMUNITY)
Admission: AD | Admit: 2014-05-28 | Discharge: 2014-06-13 | DRG: 765 | Disposition: A | Discharge status: Home / Self Care | Payer: Managed Care, Other (non HMO) | Source: Ambulatory Visit | Attending: Obstetrics and Gynecology | Admitting: Obstetrics and Gynecology

## 2014-05-28 ENCOUNTER — Encounter (HOSPITAL_COMMUNITY): Payer: Self-pay | Admitting: *Deleted

## 2014-05-28 DIAGNOSIS — O1403 Mild to moderate pre-eclampsia, third trimester: Secondary | ICD-10-CM

## 2014-05-28 DIAGNOSIS — O43123 Velamentous insertion of umbilical cord, third trimester: Secondary | ICD-10-CM | POA: Diagnosis present

## 2014-05-28 DIAGNOSIS — Z23 Encounter for immunization: Secondary | ICD-10-CM | POA: Diagnosis not present

## 2014-05-28 DIAGNOSIS — O321XX0 Maternal care for breech presentation, not applicable or unspecified: Secondary | ICD-10-CM | POA: Diagnosis present

## 2014-05-28 DIAGNOSIS — Z885 Allergy status to narcotic agent status: Secondary | ICD-10-CM

## 2014-05-28 DIAGNOSIS — Z880 Allergy status to penicillin: Secondary | ICD-10-CM

## 2014-05-28 DIAGNOSIS — O163 Unspecified maternal hypertension, third trimester: Secondary | ICD-10-CM

## 2014-05-28 DIAGNOSIS — Z3A30 30 weeks gestation of pregnancy: Secondary | ICD-10-CM

## 2014-05-28 DIAGNOSIS — Z3A31 31 weeks gestation of pregnancy: Secondary | ICD-10-CM

## 2014-05-28 DIAGNOSIS — O133 Gestational [pregnancy-induced] hypertension without significant proteinuria, third trimester: Secondary | ICD-10-CM | POA: Diagnosis present

## 2014-05-28 DIAGNOSIS — Z3A29 29 weeks gestation of pregnancy: Secondary | ICD-10-CM | POA: Diagnosis present

## 2014-05-28 DIAGNOSIS — O10912 Unspecified pre-existing hypertension complicating pregnancy, second trimester: Secondary | ICD-10-CM

## 2014-05-28 DIAGNOSIS — O1413 Severe pre-eclampsia, third trimester: Principal | ICD-10-CM | POA: Diagnosis present

## 2014-05-28 DIAGNOSIS — Z9889 Other specified postprocedural states: Secondary | ICD-10-CM

## 2014-05-28 DIAGNOSIS — Z87891 Personal history of nicotine dependence: Secondary | ICD-10-CM | POA: Diagnosis not present

## 2014-05-28 DIAGNOSIS — O1490 Unspecified pre-eclampsia, unspecified trimester: Secondary | ICD-10-CM | POA: Diagnosis present

## 2014-05-28 DIAGNOSIS — O99213 Obesity complicating pregnancy, third trimester: Secondary | ICD-10-CM

## 2014-05-28 HISTORY — DX: Depression, unspecified: F32.A

## 2014-05-28 HISTORY — DX: Major depressive disorder, single episode, unspecified: F32.9

## 2014-05-28 LAB — COMPREHENSIVE METABOLIC PANEL
ALBUMIN: 2.4 g/dL — AB (ref 3.5–5.2)
ALK PHOS: 78 U/L (ref 39–117)
ALT: 15 U/L (ref 0–35)
AST: 20 U/L (ref 0–37)
Anion gap: 11 (ref 5–15)
BUN: 10 mg/dL (ref 6–23)
CO2: 24 mEq/L (ref 19–32)
Calcium: 9.2 mg/dL (ref 8.4–10.5)
Chloride: 101 mEq/L (ref 96–112)
Creatinine, Ser: 0.56 mg/dL (ref 0.50–1.10)
GFR calc Af Amer: >90 mL/min (ref >90)
GFR calc non Af Amer: >90 mL/min (ref >90)
GLUCOSE: 94 mg/dL (ref 70–99)
POTASSIUM: 4 meq/L (ref 3.7–5.3)
SODIUM: 136 meq/L — AB (ref 137–147)
TOTAL PROTEIN: 5.9 g/dL — AB (ref 6.0–8.3)
Total Bilirubin: 0.2 mg/dL — ABNORMAL LOW (ref 0.3–1.2)

## 2014-05-28 LAB — CBC
HCT: 35.8 % — ABNORMAL LOW (ref 36.0–46.0)
HEMOGLOBIN: 12.4 g/dL (ref 12.0–15.0)
MCH: 31.5 pg (ref 26.0–34.0)
MCHC: 34.6 g/dL (ref 30.0–36.0)
MCV: 90.9 fL (ref 78.0–100.0)
Platelets: 177 10*3/uL (ref 150–400)
RBC: 3.94 MIL/uL (ref 3.87–5.11)
RDW: 13.9 % (ref 11.5–15.5)
WBC: 13.2 10*3/uL — ABNORMAL HIGH (ref 4.0–10.5)

## 2014-05-28 LAB — URINALYSIS, ROUTINE W REFLEX MICROSCOPIC
Bilirubin Urine: NEGATIVE
Glucose, UA: NEGATIVE mg/dL
Hgb urine dipstick: NEGATIVE
KETONES UR: NEGATIVE mg/dL
LEUKOCYTES UA: NEGATIVE
NITRITE: NEGATIVE
PH: 7 (ref 5.0–8.0)
Protein, ur: NEGATIVE mg/dL
Specific Gravity, Urine: 1.01 (ref 1.005–1.030)
Urobilinogen, UA: 0.2 mg/dL (ref 0.0–1.0)

## 2014-05-28 LAB — PROTEIN / CREATININE RATIO, URINE
Creatinine, Urine: 62.37 mg/dL
PROTEIN CREATININE RATIO: 0.32 — AB (ref 0.00–0.15)
Total Protein, Urine: 19.9 mg/dL

## 2014-05-28 MED ORDER — MAGNESIUM SULFATE 40 G IN LACTATED RINGERS - SIMPLE
2.0000 g/h | INTRAVENOUS | Status: DC
  Filled 2014-05-28: qty 500

## 2014-05-28 MED ORDER — LABETALOL HCL 5 MG/ML IV SOLN
10.0000 mg | Freq: Once | INTRAVENOUS | Status: AC
  Administered 2014-05-28: 10 mg via INTRAVENOUS
  Filled 2014-05-28: qty 4

## 2014-05-28 MED ORDER — BETAMETHASONE SOD PHOS & ACET 6 (3-3) MG/ML IJ SUSP
12.0000 mg | INTRAMUSCULAR | Status: AC
  Administered 2014-05-28 – 2014-05-29 (×2): 12 mg via INTRAMUSCULAR
  Filled 2014-05-28 (×2): qty 2

## 2014-05-28 MED ORDER — SODIUM CHLORIDE 0.9 % IV SOLN: 250.0000 mL | INTRAVENOUS | Status: DC | PRN

## 2014-05-28 MED ORDER — SODIUM CHLORIDE 0.9 % IJ SOLN: 3.0000 mL | Freq: Two times a day (BID) | INTRAMUSCULAR | Status: DC

## 2014-05-28 MED ORDER — SODIUM CHLORIDE 0.9 % IJ SOLN: 3.0000 mL | INTRAMUSCULAR | Status: DC | PRN

## 2014-05-28 MED ORDER — LABETALOL HCL 100 MG PO TABS
200.0000 mg | ORAL_TABLET | Freq: Two times a day (BID) | ORAL | Status: DC
  Administered 2014-05-29 – 2014-06-01 (×8): 200 mg via ORAL
  Administered 2014-06-02: 100 mg via ORAL
  Administered 2014-06-02: 200 mg via ORAL
  Administered 2014-06-02: 100 mg via ORAL
  Administered 2014-06-03 – 2014-06-08 (×11): 200 mg via ORAL
  Filled 2014-05-28 (×2): qty 2
  Filled 2014-05-28: qty 1
  Filled 2014-05-28 (×5): qty 2
  Filled 2014-05-28: qty 1
  Filled 2014-05-28 (×11): qty 2
  Filled 2014-05-28: qty 1
  Filled 2014-05-28 (×3): qty 2

## 2014-05-28 MED ORDER — CALCIUM CARBONATE ANTACID 500 MG PO CHEW
2.0000 | CHEWABLE_TABLET | ORAL | Status: DC | PRN
  Administered 2014-05-29 – 2014-06-05 (×7): 400 mg via ORAL
  Filled 2014-05-28 (×8): qty 1
  Filled 2014-05-28: qty 2
  Filled 2014-05-28 (×3): qty 1
  Filled 2014-05-28: qty 2

## 2014-05-28 MED ORDER — MAGNESIUM SULFATE BOLUS VIA INFUSION
4.0000 g | Freq: Once | INTRAVENOUS | Status: AC
  Administered 2014-05-28: 4 g via INTRAVENOUS
  Filled 2014-05-28: qty 500

## 2014-05-28 MED ORDER — ZOLPIDEM TARTRATE 5 MG PO TABS
5.0000 mg | ORAL_TABLET | Freq: Every evening | ORAL | Status: DC | PRN
  Administered 2014-05-29 – 2014-06-08 (×9): 5 mg via ORAL
  Filled 2014-05-28 (×9): qty 1

## 2014-05-28 MED ORDER — PRENATAL MULTIVITAMIN CH
1.0000 | ORAL_TABLET | Freq: Every day | ORAL | Status: DC
  Administered 2014-05-28 – 2014-06-08 (×12): 1 via ORAL
  Filled 2014-05-28 (×13): qty 1

## 2014-05-28 MED ORDER — DOCUSATE SODIUM 100 MG PO CAPS
100.0000 mg | ORAL_CAPSULE | Freq: Every day | ORAL | Status: DC
  Administered 2014-05-29 – 2014-06-08 (×11): 100 mg via ORAL
  Filled 2014-05-28 (×14): qty 1

## 2014-05-28 MED ORDER — ACETAMINOPHEN 325 MG PO TABS
650.0000 mg | ORAL_TABLET | ORAL | Status: DC | PRN
Start: 2014-05-28 — End: 2014-06-09
  Administered 2014-05-28 – 2014-06-04 (×3): 650 mg via ORAL
  Filled 2014-05-28 (×3): qty 2

## 2014-05-28 NOTE — Consult Note (Signed)
Neonatology Consult to Antenatal Patient:  I was asked by Dr. Alwyn Pea to see this patient in order to provide antenatal counseling due to pre-eclampsia at 29 4/7 weeks.  Ms. Michele Rojas was admitted today at 29 4/[redacted] weeks GA. She is currently not having active labor. She is getting BMZ and is being monitored and evaluated for elevated BP. She is getting Labetalol and will be starting Magnesium sulfate this evening.  I spoke with the patient and her father; her mother joined [place] late in the discussion. We discussed the worst case of delivery in the next 1-2 days, including usual DR management, possible respiratory complications and need for support, IV access, feedings (mother desires breast feeding, which was encouraged), LOS, Mortality and Morbidity, and long term outcomes. She had a few questions, which I answered. I offered a NICU tour to any interested family members and would be glad to come back if she has more questions later.  Thank you for asking me to see this patient.  Real Cons, MD Neonatologist  The total length of face-to-face or floor/unit time for this encounter was 20 minutes. Counseling and/or coordination of care was 15 minutes of the above.

## 2014-05-28 NOTE — H&P (Signed)
[name] is 28 yo G1P0  Patient's last menstrual period was 09/03/2013. [redacted]w[redacted]d  Pt presents with facial and LE edema, she notes she has been seeing "floaters" the last  Few days, not black spots. She denies headache no RUQ pain. She denies CTX, no  Vaginal bleeding, no LOF and notes good FM.  She has no h/o previously of CHTN, or  High blood pressure in this pregnancy.   Pregnancy complicated by Abnormal 1HR patient passed [place]  Past Medical History  Diagnosis Date  . Tobacco abuse   . Fatigue   . Constipation   . History of chicken pox as a child  . Headache   . Depression     PSURG HX  Past Surgical History  Procedure Laterality Date  . Cholecystectomy, laparoscopic    . Wisdom tooth extraction    . Achilles tendon surgery    . Bartholin gland cyst excision      Fam HX  Family History  Problem Relation Age of Onset  . Depression Mother     after a car accident  . Hypertension Father   . Hyperlipidemia Father   . Aneurysm Maternal Grandfather   . Alzheimer's disease Other   . Depression Brother     major depression    Soc HX  History   Social History  . Marital Status: Married    Spouse Name: N/A    Number of Children: N/A  . Years of Education: N/A   Occupational History  . Not on file.   Social History Main Topics  . Smoking status: Former Smoker -- 0.75 packs/day    Types: Cigarettes    Quit date: 05/09/2010  . Smokeless tobacco: Never Used  . Alcohol Use: No     Comment: socially  . Drug Use: No  . Sexual Activity: Yes   Other Topics Concern  . Not on file   Social History Narrative   GYN- Dr. 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   Gen WDWN  IN NAD CV RRR LUNGS CTAB ABD Gravid soft NT GU Deferred EXT 2+ edema DTRs: 2+ b/l no clonus  Results for Michele Rojas, Michele Rojas (MRN [id number]) as of 05/28/2014 17:30  Ref. Range 05/28/2014 14:08  Sodium Latest Range: 137-147 mEq/L 136 (L)  Potassium Latest Range: 3.7-5.3 mEq/L 4.0  Chloride Latest Range: 96-112 mEq/L 101  CO2 Latest Range: 19-32 mEq/L 24  BUN Latest Range: 6-23 mg/dL 10  Creatinine Latest Range: 0.50-1.10 mg/dL 0.56  Calcium Latest Range: 8.4-10.5 mg/dL 9.2  GFR calc non Af Amer Latest Range: >90 mL/min >90  GFR calc Af Amer Latest Range: >90 mL/min >90  Glucose Latest Range: 70-99 mg/dL 94  Anion gap Latest Range: 5-15  11  Alkaline Phosphatase Latest Range: 39-117 U/L 78  Albumin Latest Range: 3.5-5.2 g/dL 2.4 (L)  AST Latest Range: 0-37 U/L 20  ALT Latest Range: 0-35 U/L 15  Total Protein Latest Range: 6.0-8.3 g/dL 5.9 (L)  Total Bilirubin Latest Range: 0.3-1.2 mg/dL 0.2 (L)  WBC Latest Range: 4.0-10.5 K/uL 13.2 (H)  RBC Latest Range: 3.87-5.11 MIL/uL 3.94  Hemoglobin Latest Range: 12.0-15.0 g/dL 12.4  HCT Latest Range: 36.0-46.0 % 35.8 (L)  MCV Latest Range: 78.0-100.0 fL 90.9  MCH Latest  Range: 26.0-34.0 pg 31.5  MCHC Latest Range: 30.0-36.0 g/dL 34.6  RDW Latest Range: 11.5-15.5 % 13.9  Platelets Latest Range: 150-400 K/uL 177    Urine protein/ creat ratio: 0.3  Assessment: 28 yo G1P0 at 29 weeks 4 days with gestational hypertension Possible preeclampsia   Plan: Admit to antepartum Consult to MFM regarding management and for [place] for growth Consult to Neonatology NST q shift Will start Betamethasone for FLM Will closely monitor BPs if severe range will start labetalol.  Patient counseled with family for more than 30 minutes regarding possible preterm delivery if her visual  Changes are indicative of neurologic sx or if BP becomes severe range.  Labs were normal except for protein / cre ratio.  Will do 24 hr urine starting tomorrow with first urine.   Michele Rojas Michele Rojas

## 2014-05-28 NOTE — MAU Provider Note (Signed)
History     CSN: [id number]  Arrival date and time: 05/28/14 1314   First Provider Initiated Contact with Patient 05/28/14 1350      Chief Complaint  Patient presents with  . Leg Swelling   HPI  Pt is a 28 yo G1P0 at [redacted]w[redacted]d wks IUP sent in from home due to report of increased leg and face swelling that has worsened today.  Swelling began "few" weeks ago.  Also reports "floaters" that started last week, none today.  Denies headache or epigastric pain.    Past Medical History  Diagnosis Date  . Tobacco abuse   . Fatigue   . Constipation   . History of chicken pox as a child  . Headache   . Depression     Past Surgical History  Procedure Laterality Date  . Cholecystectomy, laparoscopic    . Wisdom tooth extraction    . Achilles tendon surgery    . Bartholin gland cyst excision      Family History  Problem Relation Age of Onset  . Depression Mother     after a car accident  . Hypertension Father   . Hyperlipidemia Father   . Aneurysm Maternal Grandfather   . Alzheimer's disease Other   . Depression Brother     major depression    History  Substance Use Topics  . Smoking status: Former Smoker -- 0.75 packs/day    Types: Cigarettes    Quit date: 05/09/2010  . Smokeless tobacco: Never Used  . Alcohol Use: No     Comment: socially    Allergies:  Allergies  Allergen Reactions  . Amoxicillin     REACTION: yeast infection in mouth  . Hydrocodone     REACTION: itchy    Prescriptions prior to admission  Medication Sig Dispense Refill  . amphetamine-dextroamphetamine (ADDERALL) 10 MG tablet Take 10 mg by mouth 4 (four) times daily as needed.      Marland Kitchen buPROPion (WELLBUTRIN XL) 150 MG 24 hr tablet Take 300 mg by mouth daily.       Marland Kitchen loratadine (CLARITIN) 10 MG tablet Take 10 mg by mouth daily as needed.          Review of Systems  HENT:       Facial swelling  Eyes: Negative for blurred vision and double vision.  Cardiovascular: Positive for leg swelling.   Gastrointestinal: Negative for abdominal pain.  Neurological: Negative for headaches.   Physical Exam   Blood pressure 147/93, pulse 83, temperature 98.2 F (36.8 C), temperature source Oral, resp. rate 20, last menstrual period 09/03/2013.  Physical Exam  Constitutional: She is oriented to person, place, and time. She appears well-developed and well-nourished. No distress.  HENT:  Head: Normocephalic.  Eyes: Pupils are equal, round, and reactive to light.  Neck: Normal range of motion. Neck supple.  Cardiovascular: Normal rate, regular rhythm and normal heart sounds.   Respiratory: Effort normal and breath sounds normal. No respiratory distress.  GI: Soft. There is no tenderness.  Genitourinary: No bleeding around the vagina.  Musculoskeletal: Normal range of motion. She exhibits edema (2+ bilat pedal swelling).  Hand swelling  Neurological: She is alert and oriented to person, place, and time. She has normal reflexes. She displays normal reflexes.  Skin: Skin is warm and dry.   FHR 130's, +accels Toco - irregular MAU Course  Procedures  1450 Consulted with Dr. Alwyn Pea > reviewed HPI/exam/medical history/labs ordered> await results and call with results 1505 Dr. Alwyn Pea called and  given lab results - wnl, protein creatinine ratio pending > plans to come assess patient in MAU  KARIM, [facility] N 05/28/2014, 3:05 PM  Assessment and Plan

## 2014-05-28 NOTE — MAU Provider Note (Signed)
Reviewed case with CNM and I agree with above Gunhild Bautch STACIA

## 2014-05-28 NOTE — MAU Note (Signed)
Patient states she has had increasing swelling and has been seeing spots. Feels like swelling is affecting her speech. Reports mild contractions, no bleeding or leaking. Reports good fetal movement.

## 2014-05-29 ENCOUNTER — Inpatient Hospital Stay (HOSPITAL_COMMUNITY): Payer: Managed Care, Other (non HMO)

## 2014-05-29 MED ORDER — INFLUENZA VAC SPLIT QUAD 0.5 ML IM SUSY
0.5000 mL | PREFILLED_SYRINGE | INTRAMUSCULAR | Status: AC
  Administered 2014-05-30: 0.5 mL via INTRAMUSCULAR
  Filled 2014-05-29 (×2): qty 0.5

## 2014-05-29 MED ORDER — LACTATED RINGERS IV SOLN
INTRAVENOUS | Status: DC
  Administered 2014-05-29 – 2014-06-08 (×3): via INTRAVENOUS

## 2014-05-29 MED ORDER — NALBUPHINE HCL 10 MG/ML IJ SOLN
10.0000 mg | INTRAMUSCULAR | Status: DC | PRN
  Administered 2014-05-29 – 2014-05-30 (×4): 10 mg via INTRAVENOUS
  Filled 2014-05-29 (×5): qty 1

## 2014-05-29 MED ORDER — MAGNESIUM SULFATE 40 G IN LACTATED RINGERS - SIMPLE
2.0000 g/h | INTRAVENOUS | Status: AC
  Filled 2014-05-29: qty 500

## 2014-05-29 NOTE — Consult Note (Signed)
Maternal Fetal Medicine Consultation  Requesting Provider(s): Sanjuana Kava, MD  Reason for consultation: Preeclampsia  HPI: Michele Rojas is a 28 yo G1P0 currently at 29w 5d who was admitted yesterday due to preeclampsia.  She reports worsening facial swelling, lower extremity edema and "floaters" over the last several days.  She denies any history of CHTN and blood pressures were normal, but noted to be elevated and labile since admission.  The patient had one severe range blood pressure (172/110) shortly after admission - since then, have mostly been normal with occasional blood pressures in the 150/90 range. Admission preeclampsia labs showed a protein/Creat ratio of 0.32.  The remainder of her labs were within normal limits.  A 24-hr urine protein collection is currently in progress.  She was given her first dose of betamethasone and she is currently on Magnesium sulfate prophylaxis. Her prenatal course has otherwise been uncomplicated.    OB History: OB History   Grav Para Term Preterm Abortions TAB SAB Ect Mult Living   1               PMH:  Past Medical History  Diagnosis Date  . Tobacco abuse   . Fatigue   . Constipation   . History of chicken pox as a child  . Headache   . Depression     PSH:  Past Surgical History  Procedure Laterality Date  . Cholecystectomy, laparoscopic    . Wisdom tooth extraction    . Achilles tendon surgery    . Bartholin gland cyst excision     Meds:  Scheduled Meds: . betamethasone acetate-betamethasone sodium phosphate  12 mg Intramuscular Q24H  . docusate sodium  100 mg Oral Daily  . [START ON 05/30/2014] Influenza vac split quadrivalent PF  0.5 mL Intramuscular Tomorrow-1000  . labetalol  200 mg Oral Q12H  . prenatal multivitamin  1 tablet Oral Q1200   Continuous Infusions: . lactated ringers 100 mL/hr at 05/29/14 0839  . magnesium sulfate 2 g/hr (05/28/14 2225)   PRN Meds:.acetaminophen, calcium carbonate, nalbuphine,  zolpidem  Allergies:  Allergies  Allergen Reactions  . Amoxicillin     REACTION: yeast infection in mouth  . Hydrocodone     REACTION: itchy   FH:  Family History  Problem Relation Age of Onset  . Depression Mother     after a car accident  . Hypertension Father   . Hyperlipidemia Father   . Aneurysm Maternal Grandfather   . Alzheimer's disease Other   . Depression Brother     major depression   Soc:  History   Social History  . Marital Status: Married    Spouse Name: N/A    Number of Children: N/A  . Years of Education: N/A   Occupational History  . Not on file.   Social History Main Topics  . Smoking status: Former Smoker -- 0.75 packs/day    Types: Cigarettes    Quit date: 05/09/2010  . Smokeless tobacco: Never Used  . Alcohol Use: No     Comment: socially  . Drug Use: No  . Sexual Activity: Yes   Other Topics Concern  . Not on file   Social History Narrative   GYN- Dr. 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 All  other systems reviewed and are negative.  PE:   Filed Vitals:   05/29/14 1205  BP: 140/74  Pulse: 82  Temp:   Resp:     GEN: well-appearing female ABD: gravid, NT  Ultrasound: Single IUP at 29w 5d Preeclampsia Normal fetal anatomic survey - limited views of the fetal heart obtained The estimated fetal weight today is at the 46th %tile. Elevated UA Doppler studies noted.  No AEDF or REDF noted Normal amniotic fluid volume A velamentous cord insertion is noted  Labs: CBC    Component Value Date/Time   WBC 13.2* 05/28/2014 1408   RBC 3.94 05/28/2014 1408   HGB 12.4 05/28/2014 1408   HCT 35.8* 05/28/2014 1408   PLT 177 05/28/2014 1408   MCV 90.9 05/28/2014 1408   MCH 31.5 05/28/2014 1408   MCHC 34.6 05/28/2014 1408   RDW 13.9  05/28/2014 1408   LYMPHSABS 4.2* 10/04/2011 1629   MONOABS 0.7 10/04/2011 1629   EOSABS 0.1 10/04/2011 1629   BASOSABS 0.1 10/04/2011 1629   CMP     Component Value Date/Time   NA 136* 05/28/2014 1408   K 4.0 05/28/2014 1408   CL 101 05/28/2014 1408   CO2 24 05/28/2014 1408   GLUCOSE 94 05/28/2014 1408   BUN 10 05/28/2014 1408   CREATININE 0.56 05/28/2014 1408   CALCIUM 9.2 05/28/2014 1408   PROT 5.9* 05/28/2014 1408   ALBUMIN 2.4* 05/28/2014 1408   AST 20 05/28/2014 1408   ALT 15 05/28/2014 1408   ALKPHOS 78 05/28/2014 1408   BILITOT 0.2* 05/28/2014 1408   GFRNONAA >90 05/28/2014 1408   GFRAA >90 05/28/2014 1408   Protein/Creat: 0.32  A/P: 1) Single IUP at [redacted]w[redacted]d         2) Preeclampsia without severe features - the patient had an isolated severe range blood pressure on admission- since then, have been reasonably well controlled on 200 mg Labetalol BID.  We briefly reviewed management of preeclampsia and the likelihood of an indicated preterm delivery.  Based on the current clinical scenario, feel that she will likely require hospitalization until delivery.  Recommendations: 1) Concur with betamethasone 2) Feel that it would be reasonable to discontinue the Magnesium sulfate at this time.  Would re-start Magnesium if there is a significant clinical change and delivery is felt to be imminent. 3) Concur with Labetalol.  May need to titrate the dose upward if needed to maintain BPs in the 140/90 range (max dose 800 mg TID) 4) Recommend at least daily NSTs.  Will need weekly AFIs and UA Doppler studies.  Recommend follow up ultrasound in 3 weeks (if still pregnant) 5) Recommend preeclampsia labs every 48-72 hours. 6) Would recommend moving toward delivery if BPs persistently in severe range (SBP > 160 or DBP >110) despite antihypertensive medications, LFTs >2x nl; thrombocytopenia < 100K, severe headaches unresponsive to medications, Creat > 1.2 mg/dl. 7) If the patient develops severe  features, would recommend delivery by [redacted] weeks gestation.  If stable, may be able to prolong delivery until 37 weeks, but feel that this is very unlikely given the current clinical scenario.  Thank you for the opportunity to be a part of the care of Michele Rojas. Please contact our office if we can be of further assistance.   I spent approximately 30 minutes with this patient with over 50% of time spent in face-to-face counseling.  Benjaman Lobe, MD Maternal Fetal Medicine

## 2014-05-29 NOTE — Plan of Care (Signed)
Problem: Phase I Progression Outcomes Goal: Pain controlled with appropriate interventions Pt tried tylenol with no relief, so reevaluated and now using nubain IV for pain control. Pt states working much better and pain in better control.

## 2014-05-30 LAB — PROTEIN, URINE, 24 HOUR
Collection Interval-UPROT: 24 hours
Protein, 24H Urine: 531 mg/d — ABNORMAL HIGH (ref <150)
Protein, Urine: 17 mg/dL (ref 5–24)
Urine Total Volume-UPROT: 3125 mL

## 2014-05-30 MED ORDER — ONDANSETRON HCL 4 MG/2ML IJ SOLN: 4.0000 mg | Freq: Four times a day (QID) | INTRAMUSCULAR | Status: DC | PRN

## 2014-05-30 MED ORDER — SODIUM CHLORIDE 0.9 % IJ SOLN
3.0000 mL | Freq: Two times a day (BID) | INTRAMUSCULAR | Status: DC
  Administered 2014-05-30 – 2014-06-08 (×18): 3 mL via INTRAVENOUS

## 2014-05-30 MED ORDER — ONDANSETRON HCL 4 MG PO TABS
4.0000 mg | ORAL_TABLET | Freq: Three times a day (TID) | ORAL | Status: DC | PRN
  Administered 2014-05-30: 4 mg via ORAL
  Filled 2014-05-30: qty 1

## 2014-05-30 MED ORDER — BUTALBITAL-APAP-CAFFEINE 50-325-40 MG PO TABS
1.0000 | ORAL_TABLET | Freq: Four times a day (QID) | ORAL | Status: DC | PRN
  Administered 2014-05-30 – 2014-06-08 (×8): 1 via ORAL
  Filled 2014-05-30 (×9): qty 1

## 2014-05-30 NOTE — Plan of Care (Signed)
Problem: Phase I Progression Outcomes Goal: Other Phase I Outcomes/Goals Outcome: Progressing Pt on labatelol for high blood pressure. Patient informed of signs and symptoms of preeclampsia. Patient verbalizes understanding at this time.

## 2014-05-30 NOTE — Plan of Care (Signed)
Problem: Consults Goal: Birthing Suites Patient Information Press F2 to bring up selections list  Outcome: Completed/Met Date Met:  05/30/14  Pt < [redacted] weeks EGA and Antenatal Patient (< 37 weeks)    Goal: NICU Tour Outcome: Progressing Pt requesting NICU tour when FOB is visiting. RN to pass on to next shift for future arrangements to be made     Problem: Phase I Progression Outcomes Goal: Spiritual Needs (Notify SW/CM or Social Work) Outcome: Progressing Pt notified that social workers and chaplains can be contacted if patient desires to speak to them during hospital admission.  Problem: Phase II Progression Outcomes Goal: Tolerating diet Outcome: Completed/Met Date Met:  05/30/14 Patient on low sodium regular diet

## 2014-05-30 NOTE — Progress Notes (Signed)
28 y.o. G1P0 [redacted]w[redacted]d HD#2 admitted with elevated BPs.  She was started on labetalol 200 mg bid.  BPs have normalized to the mild range.  Has occ heartburn, but no epidgastric or RUQ pain, no BV, occ HA that resolve w meds.     BP 129/73  Pulse 80  Temp(Src) 98.7 F (37.1 C) (Oral)  Resp 18  Ht 5\' 6"  (1.676 m)  Wt 95.255 kg (210 lb)  BMI 33.91 kg/m2  LMP 09/03/2013   Lungs CTA Cor RRR Abd  Soft, gravid, nontender Ex SCDs FHTs  130s, mod var, Cat 1, reactive  Toco  quiet  Ultrasound 10/21: EFW 1380 g (46%), elevated UA dopplers, velamentous cord insertion.    A:  HD#2  [redacted]w[redacted]d with preeclampsia without severe features 1. Elevated BPs: likely preeclampsia.  24 hr urine pending per MFM recs.  U p:c 0.32.  Labs wnl.   BPs now mild range on 200 mg labetalol bid.  Per MFM recommendations, will plan continued expectant management provided no severe features develop.  Will plan for labs 2-3x/week, daily NST, titrate labetalol as needed.  Delivery is indicated for severe symptoms, severe BPs not responsive to increasing medications, or abnormal labs.  Plan for weekly AFI/UA dopplers with f/u growth in 3 weeks.  2. Prematurity: BMZ course 10/20-10/21 3. FSR: plan for daily NST  [place], [facility]

## 2014-05-30 NOTE — Progress Notes (Signed)
Patient called out for nurse; patient in restroom and stated she started coughing and it caused her to vomit requesting something for nausea and heartburn; Dr Ouida Sills called for new orders

## 2014-05-31 LAB — TYPE AND SCREEN
ABO/RH(D): A NEG
Antibody Screen: POSITIVE
DAT, IgG: NEGATIVE
UNIT DIVISION: 0
Unit division: 0

## 2014-05-31 LAB — COMPREHENSIVE METABOLIC PANEL
ALK PHOS: 73 U/L (ref 39–117)
ALT: 12 U/L (ref 0–35)
AST: 16 U/L (ref 0–37)
Albumin: 2.2 g/dL — ABNORMAL LOW (ref 3.5–5.2)
Anion gap: 11 (ref 5–15)
BUN: 10 mg/dL (ref 6–23)
CO2: 24 mEq/L (ref 19–32)
Calcium: 8.3 mg/dL — ABNORMAL LOW (ref 8.4–10.5)
Chloride: 104 mEq/L (ref 96–112)
Creatinine, Ser: 0.58 mg/dL (ref 0.50–1.10)
GLUCOSE: 98 mg/dL (ref 70–99)
POTASSIUM: 4 meq/L (ref 3.7–5.3)
Sodium: 139 mEq/L (ref 137–147)
Total Bilirubin: <0.2 mg/dL — ABNORMAL LOW (ref 0.3–1.2)
Total Protein: 5.2 g/dL — ABNORMAL LOW (ref 6.0–8.3)

## 2014-05-31 LAB — CBC
HEMATOCRIT: 32.9 % — AB (ref 36.0–46.0)
HEMOGLOBIN: 11 g/dL — AB (ref 12.0–15.0)
MCH: 31 pg (ref 26.0–34.0)
MCHC: 33.4 g/dL (ref 30.0–36.0)
MCV: 92.7 fL (ref 78.0–100.0)
Platelets: 160 10*3/uL (ref 150–400)
RBC: 3.55 MIL/uL — ABNORMAL LOW (ref 3.87–5.11)
RDW: 14.4 % (ref 11.5–15.5)
WBC: 16.7 10*3/uL — ABNORMAL HIGH (ref 4.0–10.5)

## 2014-05-31 MED ORDER — FAMOTIDINE 20 MG PO TABS
20.0000 mg | ORAL_TABLET | Freq: Two times a day (BID) | ORAL | Status: DC
  Administered 2014-05-31 – 2014-06-08 (×18): 20 mg via ORAL
  Filled 2014-05-31 (×18): qty 1

## 2014-05-31 NOTE — Progress Notes (Signed)
Patient up for wheelchair ride with family.

## 2014-05-31 NOTE — Progress Notes (Signed)
28 y.o. G1P0 [redacted]w[redacted]d HD#3 admitted for Preeclampsia without severe features.  Pt currently stable with no c/o s/s severe or labor.  Filed Vitals:   05/31/14 0159 05/31/14 0200 05/31/14 0531 05/31/14 0532  BP:  139/90  148/87  Pulse:  77  59  Temp: 99.3 F (37.4 C)  97.9 F (36.6 C)   TempSrc: Oral  Axillary   Resp: 20  18   Height:      Weight:       Lungs CTA Cor RRR Abd  Soft, gravid, nontender Ex SCDs FHTs  At 2100 last night- 130s, good short term variability, NST R Toco  occ  Results for orders placed during the hospital encounter of 05/28/14 (from the past 24 hour(s))  CBC     Status: Abnormal   Collection Time    05/31/14  5:20 AM      Result Value Ref Range   WBC 16.7 (*) 4.0 - 10.5 K/uL   RBC 3.55 (*) 3.87 - 5.11 MIL/uL   Hemoglobin 11.0 (*) 12.0 - 15.0 g/dL   HCT 32.9 (*) 36.0 - 46.0 %   MCV 92.7  78.0 - 100.0 fL   MCH 31.0  26.0 - 34.0 pg   MCHC 33.4  30.0 - 36.0 g/dL   RDW 14.4  11.5 - 15.5 %   Platelets 160  150 - 400 K/uL  COMPREHENSIVE METABOLIC PANEL     Status: Abnormal   Collection Time    05/31/14  5:20 AM      Result Value Ref Range   Sodium 139  137 - 147 mEq/L   Potassium 4.0  3.7 - 5.3 mEq/L   Chloride 104  96 - 112 mEq/L   CO2 24  19 - 32 mEq/L   Glucose, Bld 98  70 - 99 mg/dL   BUN 10  6 - 23 mg/dL   Creatinine, Ser 0.58  0.50 - 1.10 mg/dL   Calcium 8.3 (*) 8.4 - 10.5 mg/dL   Total Protein 5.2 (*) 6.0 - 8.3 g/dL   Albumin 2.2 (*) 3.5 - 5.2 g/dL   AST 16  0 - 37 U/L   ALT 12  0 - 35 U/L   Alkaline Phosphatase 73  39 - 117 U/L   Total Bilirubin <0.2 (*) 0.3 - 1.2 mg/dL   GFR calc non Af Amer >90  >90 mL/min   GFR calc Af Amer >90  >90 mL/min   Anion gap 11  5 - 15   24 hour urine protein 531. A:  HD#3  [redacted]w[redacted]d with preeclampsia preterm without severe features.  P: Recommendations per MFM:  1)S/p betamethasone  2)Re-start Magnesium if there is a significant clinical change and delivery is felt to be imminent.  3) Continue with  Labetalol. May need to titrate the dose upward if needed to maintain BPs in the 140/90 range (max dose 800 mg TID)  4) Recommend at least daily NSTs. Will need weekly AFIs and UA Doppler studies. Recommend follow up ultrasound in 3 weeks (if still pregnant).  5) Recommend preeclampsia labs every 48-72 hours.  6) Would recommend moving toward delivery if BPs persistently in severe range (SBP > 160 or DBP >110) despite antihypertensive medications, LFTs >2x nl; thrombocytopenia < 100K, severe headaches unresponsive to medications, Creat > 1.2 mg/dl.  7) If the patient develops severe features, would recommend delivery by [redacted] weeks gestation. If stable, may be able to prolong delivery until 37 weeks, but feel that this is very  unlikely given the current clinical scenario.   Labs done today are all without severe features- LFTs normal and PLTs 160.  Repeat labs on Sunday. 24 hour urine 531 mg - not severe range. BPs stable today. NST today pending. US done 05-29-14  EFW 1380, 46%ile, dopplers normal.  Dopplers due in 5 days.       Della Scrivener A

## 2014-05-31 NOTE — Progress Notes (Signed)
PT Cancellation Note  Patient Details Name: Michele Rojas MRN: [id number] DOB: 04-28-1986   Cancelled Treatment:    Reason Eval/Treat Not Completed: Medical issues which prohibited therapy (note BP 148/87.  Will hold today.)   Shanna Cisco 05/31/2014, 10:11 AM 05/31/2014 [place], [place]

## 2014-06-01 NOTE — Progress Notes (Signed)
28 y.o. G1P0 [redacted]w[redacted]d HD#4 admitted for mild preeclampsia at preterm.  Pt currently stable with no c/o severe preeclampsia sx.  Good FM.  Filed Vitals:   05/31/14 1734 05/31/14 1926 05/31/14 2102 05/31/14 2135  BP: 151/84 146/86    Pulse: 57 63    Temp:  98.7 F (37.1 C)    TempSrc:  Oral    Resp: 18 18 18 18   Height:      Weight:        Lungs CTA Cor RRR Abd  Soft, gravid, nontender Ex SCDs FHTs  At 2100 yesterday, 140s, good short term variability, NST R Toco  none  PIH labs yesterday all without severe changes.    A:  HD#4  [redacted]w[redacted]d with mild preeclampsia preterm.  P: Recommendations per MFM:  1)S/p betamethasone  2)Re-start Magnesium if there is a significant clinical change and delivery is felt to be imminent.  3) Continue with Labetalol. May need to titrate the dose upward if needed to maintain BPs in the 140/90 range (max dose 800 mg TID)  4) Recommend at least daily NSTs. Will need weekly AFIs and UA Doppler studies. Recommend follow up ultrasound in 3 weeks (if still pregnant).  5) Recommend preeclampsia labs every 48-72 hours.  6) Would recommend moving toward delivery if BPs persistently in severe range (SBP > 160 or DBP >110) despite antihypertensive medications, LFTs >2x nl; thrombocytopenia < 100K, severe headaches unresponsive to medications, Creat > 1.2 mg/dl.  7) If the patient develops severe features, would recommend delivery by [redacted] weeks gestation. If stable, may be able to prolong delivery until 37 weeks, but feel that this is very unlikely given the current clinical scenario.   Labs done yesterday are all without severe features- LFTs normal and PLTs 160. Repeat labs on Sunday.  Last 24 hour urine on 10-20 531 mg - not severe range.  BPs stable today.  NST today pending.  US done 05-29-14 [facility], EFW 1380, 46%ile, dopplers normal. Dopplers due in 4 days- on Wednesday 10-28.    Alexa Blish A

## 2014-06-02 LAB — CBC
HCT: 34.2 % — ABNORMAL LOW (ref 36.0–46.0)
Hemoglobin: 11.9 g/dL — ABNORMAL LOW (ref 12.0–15.0)
MCH: 31.6 pg (ref 26.0–34.0)
MCHC: 34.8 g/dL (ref 30.0–36.0)
MCV: 91 fL (ref 78.0–100.0)
PLATELETS: 159 10*3/uL (ref 150–400)
RBC: 3.76 MIL/uL — ABNORMAL LOW (ref 3.87–5.11)
RDW: 13.9 % (ref 11.5–15.5)
WBC: 16.3 10*3/uL — ABNORMAL HIGH (ref 4.0–10.5)

## 2014-06-02 LAB — COMPREHENSIVE METABOLIC PANEL
ALT: 14 U/L (ref 0–35)
AST: 18 U/L (ref 0–37)
Albumin: 2 g/dL — ABNORMAL LOW (ref 3.5–5.2)
Alkaline Phosphatase: 69 U/L (ref 39–117)
Anion gap: 11 (ref 5–15)
BUN: 11 mg/dL (ref 6–23)
CO2: 23 meq/L (ref 19–32)
Calcium: 8.8 mg/dL (ref 8.4–10.5)
Chloride: 102 mEq/L (ref 96–112)
Creatinine, Ser: 0.51 mg/dL (ref 0.50–1.10)
Glucose, Bld: 92 mg/dL (ref 70–99)
POTASSIUM: 4.1 meq/L (ref 3.7–5.3)
SODIUM: 136 meq/L — AB (ref 137–147)
Total Bilirubin: <0.2 mg/dL — ABNORMAL LOW (ref 0.3–1.2)
Total Protein: 5.1 g/dL — ABNORMAL LOW (ref 6.0–8.3)

## 2014-06-02 LAB — URIC ACID: Uric Acid, Serum: 4.3 mg/dL (ref 2.4–7.0)

## 2014-06-03 LAB — TYPE AND SCREEN
ABO/RH(D): A NEG
Antibody Screen: POSITIVE
DAT, IgG: NEGATIVE
Unit division: 0
Unit division: 0

## 2014-06-03 NOTE — Progress Notes (Addendum)
HD#6 Mild preeclampsia remote from term.  No HA/vision change/ RUQ pain.  No other complaints currently.  Good fetal movement, no bleeding, LOF or ctx.  Filed Vitals:   06/02/14 1142 06/02/14 1647 06/02/14 2007 06/02/14 2320  BP: 120/73 152/78 145/80 137/73  Pulse: 78 70 76 73  Temp: 98.3 F (36.8 C) 98.1 F (36.7 C) 98.8 F (37.1 C) 98 F (36.7 C)  TempSrc: Oral Oral Oral Oral  Resp: 20  18 18   Height:      Weight:       Gen: NAD Abd: gravid NT Ext: SCDs not in place, no CT NST pending this am  Lab Results  Component Value Date   WBC 16.3* 06/02/2014   HGB 11.9* 06/02/2014   HCT 34.2* 06/02/2014   MCV 91.0 06/02/2014   PLT 159 06/02/2014    --/--/A NEG (10/23 0520)  A/P HD#6 mild preeclampsia remote from term. Recommendations per MFM:  1)S/p betamethasone  2)Re-start Magnesium if there is a significant clinical change and delivery is felt to be imminent.  3) Continue with Labetalol. May need to titrate the dose upward if needed to maintain BPs in the 140/90 range (max dose 800 mg TID)  4) Recommend at least daily NSTs. Will need weekly AFIs and UA Doppler studies. Recommend follow up ultrasound in 3 weeks (if still pregnant).  5) Recommend preeclampsia labs every 48-72 hours, ordered for 10/27. 6) Would recommend moving toward delivery if BPs persistently in severe range (SBP > 160 or DBP >110) despite antihypertensive medications, LFTs >2x nl; thrombocytopenia < 100K, severe headaches unresponsive to medications, Creat > 1.2 mg/dl.  7) If the patient develops severe features, would recommend delivery by [redacted] weeks gestation. If stable, may be able to prolong delivery until 37 weeks, but feel that this is very unlikely given the current clinical scenario.  Labs done yesterday are all without severe features- LFTs normal and PLTs 160. Repeat labs on Sunday.  Last 24 hour urine on 10-20 531 mg - not severe range.  BPs stable today.  US done 05-29-14 [facility], EFW 1380, 46%ile,  dopplers normal. Dopplers due in 4 days- on Wednesday 10-28.  Other routine care. 8) Advised pt to where SCDs at all times to decrease DVT/PE risk.  Allyn Kenner

## 2014-06-03 NOTE — Progress Notes (Signed)
Ur chart review completed.  

## 2014-06-03 NOTE — Plan of Care (Signed)
Problem: Consults Goal: Physical Therapy Consult Outcome: Progressing PT had initial evaluation on 05/31/14  Problem: Phase I Progression Outcomes Goal: LOS < 4 days Outcome: Not Met (add Reason) Patient here for prolonged hospitalization due to increased blood pressures. Goal: Contractions < 5-6/hour Outcome: Progressing No contractions noticed on tracing.   Problem: Injury Prevention - Hypertension Goal: Adequate urine output Outcome: Progressing Output remains within normal limits.

## 2014-06-03 NOTE — Evaluation (Signed)
Physical Therapy Evaluation Patient Details Name: ADENIKE SHIDLER MRN: [id number] DOB: 1986-05-04 Today's Date: 2014/06/18   History of Present Illness  Pt adm with preclampsia and is on modified bedrest.  Clinical Impression  Pt with good understanding of ex's. No further PT needed.    Follow Up Recommendations No PT follow up    Equipment Recommendations  None recommended by PT    Recommendations for Other Services       Precautions / Restrictions Precautions Precaution Comments: modified bedrest      Mobility  Bed Mobility   Per pt she is independent                Transfers  Per pt she is independent                  Ambulation/Gait  Per pt she is independent              Science writer    Modified Rankin (Stroke Patients Only)       Balance                                             Pertinent Vitals/Pain      Home Living   Living Arrangements: Spouse/significant other                    Prior Function Level of Independence: Independent               Hand Dominance        Extremity/Trunk Assessment   Upper Extremity Assessment: Overall WFL for tasks assessed           Lower Extremity Assessment: Overall WFL for tasks assessed         Communication   Communication: No difficulties  Cognition Arousal/Alertness: Awake/alert Behavior During Therapy: WFL for tasks assessed/performed Overall Cognitive Status: Within Functional Limits for tasks assessed                      General Comments      Exercises Antenatal Exercises Ankle Circles/Pumps: AROM;Left;5 reps Quad Sets: AROM;Left;5 reps Short Arc Quad: AROM;Left;5 reps Hip ABduction/ADduction: AROM;Left;5 reps Sidelying Hip Flexor Stretch: PROM;Right      Assessment/Plan    PT Assessment Patent does not need any further PT services  PT Diagnosis Generalized weakness   PT Problem  List    PT Treatment Interventions     PT Goals (Current goals can be found in the Care Plan section) Acute Rehab PT Goals PT Goal Formulation: All assessment and education complete, DC therapy    Frequency     Barriers to discharge        Co-evaluation               End of Session   Activity Tolerance: Patient tolerated treatment well Patient left: in bed;with call bell/phone within reach;with family/visitor present           Time: 6606-3016 PT Time Calculation (min): 13 min   Charges:   PT Evaluation $Initial PT Evaluation Tier I: 1 Procedure     PT G Codes:          Detria Cummings 2014-06-18, 3:26 PM  [facility] [place] [facility] PT [phone]

## 2014-06-04 LAB — COMPREHENSIVE METABOLIC PANEL
ALBUMIN: 1.9 g/dL — AB (ref 3.5–5.2)
ALT: 12 U/L (ref 0–35)
AST: 15 U/L (ref 0–37)
Alkaline Phosphatase: 75 U/L (ref 39–117)
Anion gap: 12 (ref 5–15)
BUN: 9 mg/dL (ref 6–23)
CHLORIDE: 103 meq/L (ref 96–112)
CO2: 22 meq/L (ref 19–32)
CREATININE: 0.55 mg/dL (ref 0.50–1.10)
Calcium: 8.4 mg/dL (ref 8.4–10.5)
GFR calc Af Amer: >90 mL/min (ref >90)
Glucose, Bld: 89 mg/dL (ref 70–99)
Potassium: 4.1 mEq/L (ref 3.7–5.3)
Sodium: 137 mEq/L (ref 137–147)
Total Protein: 4.9 g/dL — ABNORMAL LOW (ref 6.0–8.3)

## 2014-06-04 LAB — CBC
HCT: 34.3 % — ABNORMAL LOW (ref 36.0–46.0)
Hemoglobin: 11.8 g/dL — ABNORMAL LOW (ref 12.0–15.0)
MCH: 31.3 pg (ref 26.0–34.0)
MCHC: 34.4 g/dL (ref 30.0–36.0)
MCV: 91 fL (ref 78.0–100.0)
Platelets: 148 10*3/uL — ABNORMAL LOW (ref 150–400)
RBC: 3.77 MIL/uL — AB (ref 3.87–5.11)
RDW: 14.1 % (ref 11.5–15.5)
WBC: 16.9 10*3/uL — ABNORMAL HIGH (ref 4.0–10.5)

## 2014-06-04 NOTE — Progress Notes (Signed)
Name: Michele Rojas Medical Record Number:  [id number] Date of Birth: [birth year] Date of Service: 06/04/2014  28 y.o. G1P0 [redacted]w[redacted]d HD#7 admitted for 30 WKS preeclampsia  Pt currently stable with no complaints. She denies HA, Blurry vision. She denies contractions, no vaginal bleeding, no leaking of fluid. Reports good FM.   The patient's past medical history and prenatal records were reviewed.  Additional issues addressed and updated today: Patient Active Problem List   Diagnosis Date Noted  . Preeclampsia 05/28/2014  . Encounter for biometric screening 09/30/2013  . Screening for lipoid disorders 09/28/2013  . Diabetes mellitus screening 09/28/2013  . Sore throat 11/15/2011  . Hypothyroid 11/04/2011  . Depression 10/04/2011  . Diarrhea 01/19/2011  . RLQ PAIN 03/20/2010  . HEADACHE 11/18/2009  . MOOD SWINGS 11/17/2009  . TRANSAMINASES, SERUM, ELEVATED 10/13/2009  . FATIGUE 10/06/2009  . TOBACCO USE 02/19/2009  . CHICKENPOX, HX OF 02/19/2009  . DEFICIENCY OF OTHER VITAMINS 04/29/2008  . OTHER CONSTIPATION 04/29/2008   Family History  Problem Relation Age of Onset  . Depression Mother     after a car accident  . Hypertension Father   . Hyperlipidemia Father   . Aneurysm Maternal Grandfather   . Alzheimer's disease Other   . Depression Brother     major depression   History   Social History  . Marital Status: Married    Spouse Name: N/A    Number of Children: N/A  . Years of Education: N/A   Social History Main Topics  . Smoking status: Former Smoker -- 0.75 packs/day    Types: Cigarettes    Quit date: 05/09/2010  . Smokeless tobacco: Never Used  . Alcohol Use: No     Comment: socially  . Drug Use: No  . Sexual Activity: Yes   Other Topics Concern  . None   Social History Narrative   GYN- Dr. 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 [place]:   06/04/14  0754  BP: 147/82  Pulse: 78  Temp:   Resp:      Physical Examination:   Filed Vitals:   06/04/14 0754  BP: 147/82  Pulse: 78  Temp:   Resp:    24 HR:  BP range 120-159 / 73-101 General appearance - alert, well appearing, and in no distress Mental status - alert, oriented to person, place, and time, normal mood, behavior, speech, dress, motor activity, and thought processes Neurological - no clonus DTR's normal and symmetric Abd  Soft, gravid, nontender Ex SCDs FHTs  145s, moderate variability accels no decels Toco  No ctx  Cervix: not evaluated  Results for orders placed during the hospital encounter of 05/28/14 (from the past 24 hour(s))  TYPE AND SCREEN     Status: None   Collection Time    06/03/14 10:02 AM      Result Value Ref Range   ABO/RH(D) A NEG     Antibody Screen POS     Sample Expiration 06/06/2014     Antibody Identification PASSIVELY ACQUIRED ANTI-D     DAT, 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     Unit Number [id number]     Blood Component Type RED CELLS,LR     Unit division 00     Status of Unit ALLOCATED     Transfusion Status OK TO TRANSFUSE     Crossmatch Result  COMPATIBLE     Unit Number [id number]     Blood Component Type RED CELLS,LR     Unit division 00     Status of Unit ALLOCATED     Transfusion Status OK TO TRANSFUSE     Crossmatch Result COMPATIBLE    CBC     Status: Abnormal   Collection Time    06/04/14  6:25 AM      Result Value Ref Range   WBC 16.9 (*) 4.0 - 10.5 K/uL   RBC 3.77 (*) 3.87 - 5.11 MIL/uL   Hemoglobin 11.8 (*) 12.0 - 15.0 g/dL   HCT 34.3 (*) 36.0 - 46.0 %   MCV 91.0  78.0 - 100.0 fL   MCH 31.3  26.0 - 34.0 pg   MCHC 34.4  30.0 - 36.0 g/dL   RDW 14.1  11.5 - 15.5 %   Platelets 148 (*) 150 - 400 K/uL  COMPREHENSIVE METABOLIC PANEL     Status: Abnormal   Collection Time    06/04/14  6:25 AM      Result Value Ref Range   Sodium 137  137 - 147 mEq/L   Potassium 4.1  3.7 - 5.3 mEq/L   Chloride 103  96 - 112 mEq/L   CO2 22  19 - 32  mEq/L   Glucose, Bld 89  70 - 99 mg/dL   BUN 9  6 - 23 mg/dL   Creatinine, Ser 0.55  0.50 - 1.10 mg/dL   Calcium 8.4  8.4 - 10.5 mg/dL   Total Protein 4.9 (*) 6.0 - 8.3 g/dL   Albumin 1.9 (*) 3.5 - 5.2 g/dL   AST 15  0 - 37 U/L   ALT 12  0 - 35 U/L   Alkaline Phosphatase 75  39 - 117 U/L   Total Bilirubin <0.2 (*) 0.3 - 1.2 mg/dL   GFR calc non Af Amer >90  >90 mL/min   GFR calc Af Amer >90  >90 mL/min   Anion gap 12  5 - 15    A:  HD#7  [redacted]w[redacted]d with preeclampsia. BPs still labile. No neurologic symptoms  P: Continue to closely monitor Platelets dropping As per MFM, if severe range BPs, or if patient with neurologic sx or if worsening Tox labs - delivery  Talik Casique, Andrez Grime

## 2014-06-04 NOTE — Progress Notes (Signed)
Antenatal Nutrition Assessment:  Currently  30 4/[redacted] weeks gestation, with preeclampsia. Height  66 "  Weight 210 lbs  pre-pregnancy weight 183 lbs .  Pre-pregnancy  BMI 29.3  IBW 130 lbs Total weight gain 27.lbs Weight gain goals 15-25 lbs Estimated needs: 2000-2200 kcal/day, 72-82 grams protein/day, 2.3 liters fluid/day  Regular diet (no added salt) tolerated well, appetite good. Snack menu provided Current diet prescription will provide for increased needs.  Nutrition related labs  CMP     Component Value Date/Time   NA 137 06/04/2014 0625   K 4.1 06/04/2014 0625   CL 103 06/04/2014 0625   CO2 22 06/04/2014 0625   GLUCOSE 89 06/04/2014 0625   BUN 9 06/04/2014 0625   CREATININE 0.55 06/04/2014 0625   CALCIUM 8.4 06/04/2014 0625   PROT 4.9* 06/04/2014 0625   ALBUMIN 1.9* 06/04/2014 0625   AST 15 06/04/2014 0625   ALT 12 06/04/2014 0625   ALKPHOS 75 06/04/2014 0625   BILITOT <0.2* 06/04/2014 0625   GFRNONAA >90 06/04/2014 0625   GFRAA >90 06/04/2014 3500     Nutrition Dx: Increased nutrient needs r/t pregnancy and fetal growth requirements aeb [redacted] weeks gestation.  No educational needs assessed at this time.  Weyman Rodney M.Fredderick Severance LDN Neonatal Nutrition Support Specialist/RD III Pager [phone]

## 2014-06-05 ENCOUNTER — Inpatient Hospital Stay (HOSPITAL_COMMUNITY): Payer: Managed Care, Other (non HMO)

## 2014-06-05 NOTE — Progress Notes (Signed)
28 y.o. G1P0 [redacted]w[redacted]d HD#8 admitted for preeclampsia without severe features.   Pt currently stable with no complaints. She denies HA, BV, RUQ/epigastric pain.  She denies contractions, no vaginal bleeding, no leaking of fluid. Reports good FM.   The patient's past medical history and prenatal records were reviewed.  Additional issues addressed and updated today: Patient Active Problem List   Diagnosis Date Noted  . Preeclampsia 05/28/2014  . Encounter for biometric screening 09/30/2013  . Screening for lipoid disorders 09/28/2013  . Diabetes mellitus screening 09/28/2013  . Sore throat 11/15/2011  . Hypothyroid 11/04/2011  . Depression 10/04/2011  . Diarrhea 01/19/2011  . RLQ PAIN 03/20/2010  . HEADACHE 11/18/2009  . MOOD SWINGS 11/17/2009  . TRANSAMINASES, SERUM, ELEVATED 10/13/2009  . FATIGUE 10/06/2009  . TOBACCO USE 02/19/2009  . CHICKENPOX, HX OF 02/19/2009  . DEFICIENCY OF OTHER VITAMINS 04/29/2008  . OTHER CONSTIPATION 04/29/2008   Family History  Problem Relation Age of Onset  . Depression Mother     after a car accident  . Hypertension Father   . Hyperlipidemia Father   . Aneurysm Maternal Grandfather   . Alzheimer's disease Other   . Depression Brother     major depression   History   Social History  . Marital Status: Married    Spouse Name: N/A    Number of Children: N/A  . Years of Education: N/A   Social History Main Topics  . Smoking status: Former Smoker -- 0.75 packs/day    Types: Cigarettes    Quit date: 05/09/2010  . Smokeless tobacco: Never Used  . Alcohol Use: No     Comment: socially  . Drug Use: No  . Sexual Activity: Yes   Other Topics Concern  . None   Social History Narrative   GYN- Dr. 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 [place]:   06/05/14 0810  BP: 149/86  Pulse: 90  Temp: 98.8 F (37.1 C)  Resp: 18     Physical  Examination:   Filed Vitals:   06/05/14 0810  BP: 149/86  Pulse: 90  Temp: 98.8 F (37.1 C)  Resp: 18   24 HR:  BP range  General appearance - alert, well appearing, and in no distress Mental status - A&Ox3 Neurological - no clonus DTR's normal and symmetric Abd  Soft, gravid, nontender Ex tr edema bilaterally  FHTs pending today Toco  Pending today  F/u MFM [place] today with normal fluid and normal UA dopplers   A&P:  HD#8  [redacted]w[redacted]d with preeclampsia without severe features.  No severe features.  BPs remain stable in mild range, mainly 140s/90s on labetalol 200 mg bid. Normal fetal growth and normal UA dopplers on [place] today.   No further UA doppler are indicated given normal fetal growth.  LFTs/Cr wnl.   If platelets remain stable (lower limit of normal at 149), patient would be a reasonable candidate for outpatient management with twice weekly testing/labs. Plan of care discussed with MFM.  Will repeat labs tomorrow.  Has been on bedrest since arrival.  Will allow ambulation today and closely monitor BPs.      [place], [facility] [place]

## 2014-06-05 NOTE — Plan of Care (Signed)
Problem: Consults Goal: Physical Therapy Consult Outcome: Progressing Physical Therapy consult done on 06/03/14.

## 2014-06-05 NOTE — Progress Notes (Signed)
Ur chart review completed.  

## 2014-06-05 NOTE — Plan of Care (Signed)
Problem: Consults Goal: NICU Tour Will go for NICU tour when available.   Problem: Phase I Progression Outcomes Goal: LOS < 4 days Outcome: Not Met (add Reason) Patient here for prolonged hospitalization due to elevated blood pressures.

## 2014-06-06 LAB — TYPE AND SCREEN
ABO/RH(D): A NEG
Antibody Screen: POSITIVE
DAT, IGG: NEGATIVE
UNIT DIVISION: 0
Unit division: 0

## 2014-06-06 LAB — COMPREHENSIVE METABOLIC PANEL
ALK PHOS: 75 U/L (ref 39–117)
ALT: 11 U/L (ref 0–35)
AST: 15 U/L (ref 0–37)
Albumin: 1.9 g/dL — ABNORMAL LOW (ref 3.5–5.2)
Anion gap: 9 (ref 5–15)
BUN: 9 mg/dL (ref 6–23)
CALCIUM: 9 mg/dL (ref 8.4–10.5)
CO2: 24 mEq/L (ref 19–32)
Chloride: 103 mEq/L (ref 96–112)
Creatinine, Ser: 0.53 mg/dL (ref 0.50–1.10)
GFR calc Af Amer: >90 mL/min (ref >90)
Glucose, Bld: 89 mg/dL (ref 70–99)
POTASSIUM: 4 meq/L (ref 3.7–5.3)
Sodium: 136 mEq/L — ABNORMAL LOW (ref 137–147)
Total Bilirubin: <0.2 mg/dL — ABNORMAL LOW (ref 0.3–1.2)
Total Protein: 5.2 g/dL — ABNORMAL LOW (ref 6.0–8.3)

## 2014-06-06 LAB — CBC
HCT: 33.8 % — ABNORMAL LOW (ref 36.0–46.0)
Hemoglobin: 11.6 g/dL — ABNORMAL LOW (ref 12.0–15.0)
MCH: 31.3 pg (ref 26.0–34.0)
MCHC: 34.3 g/dL (ref 30.0–36.0)
MCV: 91.1 fL (ref 78.0–100.0)
PLATELETS: 129 10*3/uL — AB (ref 150–400)
RBC: 3.71 MIL/uL — ABNORMAL LOW (ref 3.87–5.11)
RDW: 14 % (ref 11.5–15.5)
WBC: 15.1 10*3/uL — AB (ref 4.0–10.5)

## 2014-06-06 NOTE — Progress Notes (Signed)
Pt had another decline in Plt ct to 129K this am. Also B/Ps have increased. She has no symptoms. Baby looks good on monitor. Plan/ Will ask MFM input for criteria for induction.

## 2014-06-06 NOTE — Plan of Care (Signed)
Problem: Phase I Progression Outcomes Goal: Bowel movement every 2 days Outcome: Progressing Patient on colace daily.

## 2014-06-07 LAB — COMPREHENSIVE METABOLIC PANEL
ALT: 12 U/L (ref 0–35)
ANION GAP: 10 (ref 5–15)
AST: 15 U/L (ref 0–37)
Albumin: 1.8 g/dL — ABNORMAL LOW (ref 3.5–5.2)
Alkaline Phosphatase: 75 U/L (ref 39–117)
BUN: 10 mg/dL (ref 6–23)
CALCIUM: 8.9 mg/dL (ref 8.4–10.5)
CO2: 23 mEq/L (ref 19–32)
CREATININE: 0.56 mg/dL (ref 0.50–1.10)
Chloride: 103 mEq/L (ref 96–112)
GFR calc non Af Amer: >90 mL/min (ref >90)
GLUCOSE: 98 mg/dL (ref 70–99)
Potassium: 4 mEq/L (ref 3.7–5.3)
Sodium: 136 mEq/L — ABNORMAL LOW (ref 137–147)
Total Bilirubin: <0.2 mg/dL — ABNORMAL LOW (ref 0.3–1.2)
Total Protein: 5.3 g/dL — ABNORMAL LOW (ref 6.0–8.3)

## 2014-06-07 LAB — CBC
HCT: 35.1 % — ABNORMAL LOW (ref 36.0–46.0)
HEMOGLOBIN: 12.1 g/dL (ref 12.0–15.0)
MCH: 31.6 pg (ref 26.0–34.0)
MCHC: 34.5 g/dL (ref 30.0–36.0)
MCV: 91.6 fL (ref 78.0–100.0)
Platelets: 126 10*3/uL — ABNORMAL LOW (ref 150–400)
RBC: 3.83 MIL/uL — AB (ref 3.87–5.11)
RDW: 14.2 % (ref 11.5–15.5)
WBC: 15.1 10*3/uL — ABNORMAL HIGH (ref 4.0–10.5)

## 2014-06-07 LAB — URIC ACID: Uric Acid, Serum: 5.1 mg/dL (ref 2.4–7.0)

## 2014-06-08 ENCOUNTER — Inpatient Hospital Stay (HOSPITAL_COMMUNITY): Payer: Managed Care, Other (non HMO)

## 2014-06-08 LAB — COMPREHENSIVE METABOLIC PANEL
ALT: 10 U/L (ref 0–35)
ALT: 12 U/L (ref 0–35)
ANION GAP: 13 (ref 5–15)
AST: 16 U/L (ref 0–37)
AST: 17 U/L (ref 0–37)
Albumin: 1.8 g/dL — ABNORMAL LOW (ref 3.5–5.2)
Albumin: 1.9 g/dL — ABNORMAL LOW (ref 3.5–5.2)
Alkaline Phosphatase: 81 U/L (ref 39–117)
Alkaline Phosphatase: 92 U/L (ref 39–117)
Anion gap: 11 (ref 5–15)
BUN: 11 mg/dL (ref 6–23)
BUN: 13 mg/dL (ref 6–23)
CHLORIDE: 102 meq/L (ref 96–112)
CHLORIDE: 100 meq/L (ref 96–112)
CO2: 23 meq/L (ref 19–32)
CO2: 22 mEq/L (ref 19–32)
CREATININE: 0.59 mg/dL (ref 0.50–1.10)
Calcium: 8.8 mg/dL (ref 8.4–10.5)
Calcium: 8.9 mg/dL (ref 8.4–10.5)
Creatinine, Ser: 0.56 mg/dL (ref 0.50–1.10)
GFR calc Af Amer: >90 mL/min (ref >90)
GFR calc non Af Amer: >90 mL/min (ref >90)
GLUCOSE: 94 mg/dL (ref 70–99)
Glucose, Bld: 117 mg/dL — ABNORMAL HIGH (ref 70–99)
Potassium: 4 mEq/L (ref 3.7–5.3)
Potassium: 3.9 mEq/L (ref 3.7–5.3)
SODIUM: 136 meq/L — AB (ref 137–147)
Sodium: 135 mEq/L — ABNORMAL LOW (ref 137–147)
Total Protein: 5 g/dL — ABNORMAL LOW (ref 6.0–8.3)
Total Protein: 5.2 g/dL — ABNORMAL LOW (ref 6.0–8.3)

## 2014-06-08 LAB — CBC
HCT: 34.9 % — ABNORMAL LOW (ref 36.0–46.0)
HEMOGLOBIN: 12 g/dL (ref 12.0–15.0)
MCH: 31.4 pg (ref 26.0–34.0)
MCHC: 34.4 g/dL (ref 30.0–36.0)
MCV: 91.4 fL (ref 78.0–100.0)
PLATELETS: 128 10*3/uL — AB (ref 150–400)
RBC: 3.82 MIL/uL — AB (ref 3.87–5.11)
RDW: 14.1 % (ref 11.5–15.5)
WBC: 15.6 10*3/uL — AB (ref 4.0–10.5)

## 2014-06-08 LAB — CBC WITH DIFFERENTIAL/PLATELET
Basophils Absolute: 0 10*3/uL (ref 0.0–0.1)
Basophils Relative: 0 % (ref 0–1)
EOS ABS: 0.2 10*3/uL (ref 0.0–0.7)
Eosinophils Relative: 1 % (ref 0–5)
HCT: 36.6 % (ref 36.0–46.0)
HEMOGLOBIN: 12.7 g/dL (ref 12.0–15.0)
Lymphocytes Relative: 25 % (ref 12–46)
Lymphs Abs: 4 10*3/uL (ref 0.7–4.0)
MCH: 31.6 pg (ref 26.0–34.0)
MCHC: 34.7 g/dL (ref 30.0–36.0)
MCV: 91 fL (ref 78.0–100.0)
MONOS PCT: 5 % (ref 3–12)
Monocytes Absolute: 0.9 10*3/uL (ref 0.1–1.0)
NEUTROS ABS: 10.6 10*3/uL — AB (ref 1.7–7.7)
Neutrophils Relative %: 69 % (ref 43–77)
Platelets: 136 10*3/uL — ABNORMAL LOW (ref 150–400)
RBC: 4.02 MIL/uL (ref 3.87–5.11)
RDW: 14.1 % (ref 11.5–15.5)
WBC: 15.6 10*3/uL — ABNORMAL HIGH (ref 4.0–10.5)

## 2014-06-08 LAB — LACTATE DEHYDROGENASE
LDH: 178 U/L (ref 94–250)
LDH: 187 U/L (ref 94–250)

## 2014-06-08 LAB — URIC ACID
URIC ACID, SERUM: 5.6 mg/dL (ref 2.4–7.0)
Uric Acid, Serum: 5.6 mg/dL (ref 2.4–7.0)

## 2014-06-08 MED ORDER — LABETALOL HCL 5 MG/ML IV SOLN: 10.0000 mg | INTRAVENOUS | Status: DC | PRN

## 2014-06-08 MED ORDER — MAGNESIUM SULFATE BOLUS VIA INFUSION
6.0000 g | Freq: Once | INTRAVENOUS | Status: AC
  Administered 2014-06-08: 6 g via INTRAVENOUS
  Filled 2014-06-08: qty 500

## 2014-06-08 MED ORDER — LABETALOL HCL 100 MG PO TABS
200.0000 mg | ORAL_TABLET | Freq: Once | ORAL | Status: AC
  Administered 2014-06-08: 200 mg via ORAL
  Filled 2014-06-08: qty 2

## 2014-06-08 MED ORDER — MAGNESIUM SULFATE 40 G IN LACTATED RINGERS - SIMPLE
2.0000 g/h | INTRAVENOUS | Status: DC
  Administered 2014-06-09: 2 g/h via INTRAVENOUS
  Filled 2014-06-08 (×2): qty 500

## 2014-06-08 MED ORDER — LABETALOL HCL 5 MG/ML IV SOLN
10.0000 mg | Freq: Once | INTRAVENOUS | Status: AC
  Administered 2014-06-08: 10 mg via INTRAVENOUS
  Filled 2014-06-08: qty 4

## 2014-06-08 MED ORDER — LABETALOL HCL 300 MG PO TABS
400.0000 mg | ORAL_TABLET | Freq: Two times a day (BID) | ORAL | Status: DC
  Administered 2014-06-08: 400 mg via ORAL
  Filled 2014-06-08 (×2): qty 1

## 2014-06-08 NOTE — Progress Notes (Addendum)
Patient ID: Michele Rojas, female   DOB: 1986/05/14, 28 y.o.   MRN: [id number]  S: patient without symptoms this morning. She did have a headache earlier in the morning however this is since resolved. Active fetal movement O: Filed Vitals:   06/08/14 0853 06/08/14 0959 06/08/14 1059 06/08/14 1308  BP: 160/102 171/96 167/101 146/83  Pulse: 74 71 75 75  Temp: 97.8 F (36.6 C)   98.4 F (36.9 C)  TempSrc: Oral   Oral  Resp: 20   18  Height:      Weight:       Alert and oriented 3 Abdomen gravid, soft, nontender nondistended Fetal heart tones: 140s to 150s with accelerations and good variability. She continues to have some spontaneous decelerations. Fetal tracing looks reassuring before and after Biophysical profile 6 out of 8, -2 for no sustained fetal breathing movement. Results for orders placed during the hospital encounter of 05/28/14 (from the past 24 hour(s))  CBC     Status: Abnormal   Collection Time    06/08/14 10:00 AM      Result Value Ref Range   WBC 15.6 (*) 4.0 - 10.5 K/uL   RBC 3.82 (*) 3.87 - 5.11 MIL/uL   Hemoglobin 12.0  12.0 - 15.0 g/dL   HCT 34.9 (*) 36.0 - 46.0 %   MCV 91.4  78.0 - 100.0 fL   MCH 31.4  26.0 - 34.0 pg   MCHC 34.4  30.0 - 36.0 g/dL   RDW 14.1  11.5 - 15.5 %   Platelets 128 (*) 150 - 400 K/uL  COMPREHENSIVE METABOLIC PANEL     Status: Abnormal   Collection Time    06/08/14 10:00 AM      Result Value Ref Range   Sodium 136 (*) 137 - 147 mEq/L   Potassium 4.0  3.7 - 5.3 mEq/L   Chloride 102  96 - 112 mEq/L   CO2 23  19 - 32 mEq/L   Glucose, Bld 94  70 - 99 mg/dL   BUN 11  6 - 23 mg/dL   Creatinine, Ser 0.56  0.50 - 1.10 mg/dL   Calcium 8.8  8.4 - 10.5 mg/dL   Total Protein 5.0 (*) 6.0 - 8.3 g/dL   Albumin 1.8 (*) 3.5 - 5.2 g/dL   AST 16  0 - 37 U/L   ALT 10  0 - 35 U/L   Alkaline Phosphatase 81  39 - 117 U/L   Total Bilirubin <0.2 (*) 0.3 - 1.2 mg/dL   GFR calc non Af Amer >90  >90 mL/min   GFR calc Af Amer >90  >90 mL/min   Anion  gap 11  5 - 15  LACTATE DEHYDROGENASE     Status: None   Collection Time    06/08/14 10:00 AM      Result Value Ref Range   LDH 178  94 - 250 U/L  URIC ACID     Status: None   Collection Time    06/08/14 10:00 AM      Result Value Ref Range   Uric Acid, Serum 5.6  2.4 - 7.0 mg/dL   Assessment and plan: 28 year old G1 P0 at 31+1 with preeclampsia without severe features 1) this morning the patient has had a greater increase in her blood pressures. She remains asymptomatic from this. Will increase her labetalol to 400 mg twice daily. 2) platelet count has improved since yesterday it was 126,000 and now is 128,000. All other lab  values are normal. 3) BPP 6 out of 8 for nonsustained fetal breathing movement. Will continue continuous external monitoring.

## 2014-06-08 NOTE — Progress Notes (Addendum)
Patient ID: Michele Rojas, female   DOB: Sep 10, 1985, 28 y.o.   MRN: [id number]  S: patient with a mild headache this morning otherwise without complaint. This headache is not different than prior headaches that she has been experiencing during this hospitalization. Endorses active fetal movement. O: BPs 130s/80s FHT: 140s some spontaneous decels unrelated to contractions. The fetal tracing has good variability before and after these events. AOX3 Abd soft  NT/ND  Labs: PIH labs WNL, PLT 128-->71K  A/P: 28 year old G1 P0 at 31+0 weeks with preeclampsia without severe features 1) continue hospital bed rest at this time 2) daily lab work 3) interval development of some spontaneous decelerations on fetal tracing. Will continue continuous monitoring.

## 2014-06-08 NOTE — Progress Notes (Signed)
MD updated on pt c/o visual changes and current BP, one time dose labetalol given.  Per MD pt can go to u/s in w/c

## 2014-06-08 NOTE — Anesthesia Preprocedure Evaluation (Addendum)
Anesthesia Evaluation    Reviewed: Allergy & Precautions, H&P , NPO status , Patient's Chart, lab work & pertinent test results, reviewed documented beta blocker date and time   History of Anesthesia Complications Negative for: history of anesthetic complications  Airway Mallampati: III  TM Distance: >3 FB Neck ROM: full    Dental no notable dental hx. (+) Teeth Intact   Pulmonary neg pulmonary ROS, former smoker,  breath sounds clear to auscultation  Pulmonary exam normal       Cardiovascular hypertension (preeclampsia), Rhythm:regular Rate:Normal     Neuro/Psych  Headaches (mild HA for past several days.  Worse since yesterday.  Seeing spots), Depression    GI/Hepatic negative GI ROS, Neg liver ROS,   Endo/Other  Hypothyroidism (took low dose of thyroid medicine in the past, but has not needed medication for several years) BMI 34  Renal/GU negative Renal ROS  negative genitourinary   Musculoskeletal   Abdominal Normal abdominal exam  (+)   Peds  Hematology Thrombocytopenia - plt 127   Anesthesia Other Findings Last ate 10/31 morning  Reproductive/Obstetrics (+) Pregnancy (preeclampsia, 31 weeks, breech)                            Anesthesia Physical Anesthesia Plan  ASA: III and emergent  Anesthesia Plan: Spinal   Post-op Pain Management:    Induction:   Airway Management Planned:   Additional Equipment:   Intra-op Plan:   Post-operative Plan:   Informed Consent: I have reviewed the patients History and Physical, chart, labs and discussed the procedure including the risks, benefits and alternatives for the proposed anesthesia with the patient or authorized representative who has indicated his/her understanding and acceptance.     Plan Discussed with: Surgeon and CRNA  Anesthesia Plan Comments:         Anesthesia Quick Evaluation

## 2014-06-08 NOTE — Progress Notes (Signed)
Patient ID: Michele Rojas, female   DOB: Nov 23, 1985, 28 y.o.   MRN: [id number]  S: developed a little bit more the headache throughout the day. She is also started to see some spots in her vision. Throughout the day her blood pressures have been worsening.  O:  Filed Vitals:   06/08/14 1853 06/08/14 1858 06/08/14 1859 06/08/14 1903  BP:   150/85   Pulse: 77 86 79 91  Temp:      TempSrc:      Resp:   18   Height:      Weight:      SpO2: 98% 98%  98%   BPs 160-170s/95-103, since IV labetalol 10mg  150-158/85-103 FHT 140-150s with accelerations, patient has had more frequent spontaneous variable decelerations. Some of these decelerations have lasted longer with a longer return to baseline Toco quiet DTRs 3-4+ with 1 beat of clonus  Results for orders placed during the hospital encounter of 05/28/14 (from the past 24 hour(s))  CBC     Status: Abnormal   Collection Time    06/08/14 10:00 AM      Result Value Ref Range   WBC 15.6 (*) 4.0 - 10.5 K/uL   RBC 3.82 (*) 3.87 - 5.11 MIL/uL   Hemoglobin 12.0  12.0 - 15.0 g/dL   HCT 34.9 (*) 36.0 - 46.0 %   MCV 91.4  78.0 - 100.0 fL   MCH 31.4  26.0 - 34.0 pg   MCHC 34.4  30.0 - 36.0 g/dL   RDW 14.1  11.5 - 15.5 %   Platelets 128 (*) 150 - 400 K/uL  COMPREHENSIVE METABOLIC PANEL     Status: Abnormal   Collection Time    06/08/14 10:00 AM      Result Value Ref Range   Sodium 136 (*) 137 - 147 mEq/L   Potassium 4.0  3.7 - 5.3 mEq/L   Chloride 102  96 - 112 mEq/L   CO2 23  19 - 32 mEq/L   Glucose, Bld 94  70 - 99 mg/dL   BUN 11  6 - 23 mg/dL   Creatinine, Ser 0.56  0.50 - 1.10 mg/dL   Calcium 8.8  8.4 - 10.5 mg/dL   Total Protein 5.0 (*) 6.0 - 8.3 g/dL   Albumin 1.8 (*) 3.5 - 5.2 g/dL   AST 16  0 - 37 U/L   ALT 10  0 - 35 U/L   Alkaline Phosphatase 81  39 - 117 U/L   Total Bilirubin <0.2 (*) 0.3 - 1.2 mg/dL   GFR calc non Af Amer >90  >90 mL/min   GFR calc Af Amer >90  >90 mL/min   Anion gap 11  5 - 15  LACTATE DEHYDROGENASE      Status: None   Collection Time    06/08/14 10:00 AM      Result Value Ref Range   LDH 178  94 - 250 U/L  URIC ACID     Status: None   Collection Time    06/08/14 10:00 AM      Result Value Ref Range   Uric Acid, Serum 5.6  2.4 - 7.0 mg/dL  CBC WITH DIFFERENTIAL     Status: Abnormal   Collection Time    06/08/14  6:35 PM      Result Value Ref Range   WBC 15.6 (*) 4.0 - 10.5 K/uL   RBC 4.02  3.87 - 5.11 MIL/uL   Hemoglobin 12.7  12.0 -  15.0 g/dL   HCT 36.6  36.0 - 46.0 %   MCV 91.0  78.0 - 100.0 fL   MCH 31.6  26.0 - 34.0 pg   MCHC 34.7  30.0 - 36.0 g/dL   RDW 14.1  11.5 - 15.5 %   Platelets 136 (*) 150 - 400 K/uL   Neutrophils Relative % 69  43 - 77 %   Neutro Abs 10.6 (*) 1.7 - 7.7 K/uL   Lymphocytes Relative 25  12 - 46 %   Lymphs Abs 4.0  0.7 - 4.0 K/uL   Monocytes Relative 5  3 - 12 %   Monocytes Absolute 0.9  0.1 - 1.0 K/uL   Eosinophils Relative 1  0 - 5 %   Eosinophils Absolute 0.2  0.0 - 0.7 K/uL   Basophils Relative 0  0 - 1 %   Basophils Absolute 0.0  0.0 - 0.1 K/uL  COMPREHENSIVE METABOLIC PANEL     Status: Abnormal   Collection Time    06/08/14  6:35 PM      Result Value Ref Range   Sodium 135 (*) 137 - 147 mEq/L   Potassium 3.9  3.7 - 5.3 mEq/L   Chloride 100  96 - 112 mEq/L   CO2 22  19 - 32 mEq/L   Glucose, Bld 117 (*) 70 - 99 mg/dL   BUN 13  6 - 23 mg/dL   Creatinine, Ser 0.59  0.50 - 1.10 mg/dL   Calcium 8.9  8.4 - 10.5 mg/dL   Total Protein 5.2 (*) 6.0 - 8.3 g/dL   Albumin 1.9 (*) 3.5 - 5.2 g/dL   AST 17  0 - 37 U/L   ALT 12  0 - 35 U/L   Alkaline Phosphatase 92  39 - 117 U/L   Total Bilirubin <0.2 (*) 0.3 - 1.2 mg/dL   GFR calc non Af Amer >90  >90 mL/min   GFR calc Af Amer >90  >90 mL/min   Anion gap 13  5 - 15  LACTATE DEHYDROGENASE     Status: None   Collection Time    06/08/14  6:35 PM      Result Value Ref Range   LDH 187  94 - 250 U/L  URIC ACID     Status: None   Collection Time    06/08/14  6:35 PM      Result Value Ref Range    Uric Acid, Serum 5.6  2.4 - 7.0 mg/dL   A/P: 28 year old G1 P0 at 31+1 with worsening preeclampsia by blood pressure criteria and now development of persistent neurologic symptoms 1) repeat blood work actually shows improved platelet count. 2) Will start patient on a magnesium infusion with a 6 g load and 2 g every hour. Will make nothing by mouth. Goal is to achieve 12 hours of magnesium sulfate prior to delivery for neuro prophylaxis, however if fetal condition deteriorates we'll plan to proceed with cesarean delivery. 3) Will plan IV labetalol when necessary severe range blood pressures 4) SCDs for DVT prophylaxis 5) NICU is aware of impending delivery

## 2014-06-08 NOTE — Progress Notes (Signed)
Fetal monitor orders changed and monitors removed

## 2014-06-09 ENCOUNTER — Encounter (HOSPITAL_COMMUNITY): Payer: Self-pay | Admitting: *Deleted

## 2014-06-09 ENCOUNTER — Encounter (HOSPITAL_COMMUNITY)
Admission: AD | Disposition: A | Discharge status: Home / Self Care | Payer: Self-pay | Source: Ambulatory Visit | Attending: Obstetrics & Gynecology

## 2014-06-09 ENCOUNTER — Inpatient Hospital Stay (HOSPITAL_COMMUNITY): Payer: Managed Care, Other (non HMO) | Admitting: Anesthesiology

## 2014-06-09 DIAGNOSIS — O1490 Unspecified pre-eclampsia, unspecified trimester: Secondary | ICD-10-CM | POA: Diagnosis present

## 2014-06-09 LAB — TYPE AND SCREEN
ABO/RH(D): A NEG
Antibody Screen: POSITIVE
DAT, IGG: NEGATIVE
UNIT DIVISION: 0
Unit division: 0

## 2014-06-09 LAB — COMPREHENSIVE METABOLIC PANEL
ALK PHOS: 78 U/L (ref 39–117)
ALT: 11 U/L (ref 0–35)
ALT: 12 U/L (ref 0–35)
ANION GAP: 10 (ref 5–15)
AST: 16 U/L (ref 0–37)
AST: 22 U/L (ref 0–37)
Albumin: 1.8 g/dL — ABNORMAL LOW (ref 3.5–5.2)
Albumin: 1.6 g/dL — ABNORMAL LOW (ref 3.5–5.2)
Alkaline Phosphatase: 83 U/L (ref 39–117)
Anion gap: 11 (ref 5–15)
BUN: 9 mg/dL (ref 6–23)
BUN: 10 mg/dL (ref 6–23)
CHLORIDE: 102 meq/L (ref 96–112)
CO2: 23 meq/L (ref 19–32)
CO2: 25 meq/L (ref 19–32)
Calcium: 7.8 mg/dL — ABNORMAL LOW (ref 8.4–10.5)
Calcium: 7.9 mg/dL — ABNORMAL LOW (ref 8.4–10.5)
Chloride: 100 mEq/L (ref 96–112)
Creatinine, Ser: 0.56 mg/dL (ref 0.50–1.10)
Creatinine, Ser: 0.58 mg/dL (ref 0.50–1.10)
GFR calc non Af Amer: >90 mL/min (ref >90)
GLUCOSE: 85 mg/dL (ref 70–99)
GLUCOSE: 99 mg/dL (ref 70–99)
Potassium: 3.9 mEq/L (ref 3.7–5.3)
Potassium: 3.9 mEq/L (ref 3.7–5.3)
SODIUM: 135 meq/L — AB (ref 137–147)
Sodium: 136 mEq/L — ABNORMAL LOW (ref 137–147)
TOTAL PROTEIN: 4.6 g/dL — AB (ref 6.0–8.3)
Total Bilirubin: <0.2 mg/dL — ABNORMAL LOW (ref 0.3–1.2)
Total Protein: 5 g/dL — ABNORMAL LOW (ref 6.0–8.3)

## 2014-06-09 LAB — MAGNESIUM: Magnesium: 4.8 mg/dL — ABNORMAL HIGH (ref 1.5–2.5)

## 2014-06-09 LAB — CBC
HEMATOCRIT: 33.4 % — AB (ref 36.0–46.0)
HEMATOCRIT: 34.4 % — AB (ref 36.0–46.0)
HEMOGLOBIN: 11.5 g/dL — AB (ref 12.0–15.0)
HEMOGLOBIN: 11.8 g/dL — AB (ref 12.0–15.0)
MCH: 31.4 pg (ref 26.0–34.0)
MCH: 31.3 pg (ref 26.0–34.0)
MCHC: 34.4 g/dL (ref 30.0–36.0)
MCHC: 34.3 g/dL (ref 30.0–36.0)
MCV: 91.3 fL (ref 78.0–100.0)
MCV: 91.2 fL (ref 78.0–100.0)
Platelets: 127 10*3/uL — ABNORMAL LOW (ref 150–400)
Platelets: 121 10*3/uL — ABNORMAL LOW (ref 150–400)
RBC: 3.66 MIL/uL — ABNORMAL LOW (ref 3.87–5.11)
RBC: 3.77 MIL/uL — AB (ref 3.87–5.11)
RDW: 14.1 % (ref 11.5–15.5)
RDW: 14.2 % (ref 11.5–15.5)
WBC: 14.4 10*3/uL — AB (ref 4.0–10.5)
WBC: 16 10*3/uL — ABNORMAL HIGH (ref 4.0–10.5)

## 2014-06-09 SURGERY — Surgical Case
Anesthesia: Spinal | Site: Abdomen

## 2014-06-09 MED ORDER — BUPIVACAINE IN DEXTROSE 0.75-8.25 % IT SOLN
INTRATHECAL | Status: DC | PRN
  Administered 2014-06-09: 11.75 mg via INTRATHECAL

## 2014-06-09 MED ORDER — ONDANSETRON HCL 4 MG PO TABS: 4.0000 mg | ORAL_TABLET | ORAL | Status: DC | PRN

## 2014-06-09 MED ORDER — LACTATED RINGERS IV SOLN
INTRAVENOUS | Status: DC | PRN
  Administered 2014-06-09 (×2): via INTRAVENOUS

## 2014-06-09 MED ORDER — CEFAZOLIN SODIUM-DEXTROSE 2-3 GM-% IV SOLR
INTRAVENOUS | Status: AC
  Filled 2014-06-09: qty 50

## 2014-06-09 MED ORDER — MEPERIDINE HCL 25 MG/ML IJ SOLN
6.2500 mg | INTRAMUSCULAR | Status: DC | PRN
Start: 2014-06-09 — End: 2014-06-09

## 2014-06-09 MED ORDER — NALBUPHINE HCL 10 MG/ML IJ SOLN
5.0000 mg | INTRAMUSCULAR | Status: DC | PRN
  Filled 2014-06-09: qty 0.5

## 2014-06-09 MED ORDER — SIMETHICONE 80 MG PO CHEW
80.0000 mg | CHEWABLE_TABLET | Freq: Three times a day (TID) | ORAL | Status: DC
  Administered 2014-06-09 – 2014-06-13 (×12): 80 mg via ORAL
  Filled 2014-06-09 (×10): qty 1

## 2014-06-09 MED ORDER — SODIUM CHLORIDE 0.9 % IJ SOLN
3.0000 mL | INTRAMUSCULAR | Status: DC | PRN
  Administered 2014-06-10 – 2014-06-12 (×2): 3 mL via INTRAVENOUS
  Filled 2014-06-09 (×2): qty 3

## 2014-06-09 MED ORDER — IBUPROFEN 600 MG PO TABS
600.0000 mg | ORAL_TABLET | Freq: Four times a day (QID) | ORAL | Status: DC
  Administered 2014-06-09 – 2014-06-13 (×15): 600 mg via ORAL
  Filled 2014-06-09 (×15): qty 1

## 2014-06-09 MED ORDER — DIPHENHYDRAMINE HCL 25 MG PO CAPS
25.0000 mg | ORAL_CAPSULE | Freq: Four times a day (QID) | ORAL | Status: DC | PRN
  Administered 2014-06-09: 25 mg via ORAL

## 2014-06-09 MED ORDER — SCOPOLAMINE 1 MG/3DAYS TD PT72
MEDICATED_PATCH | TRANSDERMAL | Status: AC
  Filled 2014-06-09: qty 1

## 2014-06-09 MED ORDER — ONDANSETRON HCL 4 MG/2ML IJ SOLN: 4.0000 mg | INTRAMUSCULAR | Status: DC | PRN

## 2014-06-09 MED ORDER — MAGNESIUM SULFATE 40 G IN LACTATED RINGERS - SIMPLE
2.0000 g/h | INTRAVENOUS | Status: AC
  Filled 2014-06-09: qty 500

## 2014-06-09 MED ORDER — DIPHENHYDRAMINE HCL 50 MG/ML IJ SOLN: 12.5000 mg | INTRAMUSCULAR | Status: DC | PRN

## 2014-06-09 MED ORDER — MAGNESIUM SULFATE 40 G IN LACTATED RINGERS - SIMPLE: 2.0000 g/h | INTRAVENOUS | Status: DC

## 2014-06-09 MED ORDER — OXYCODONE-ACETAMINOPHEN 5-325 MG PO TABS
1.0000 | ORAL_TABLET | ORAL | Status: DC | PRN
  Administered 2014-06-10 – 2014-06-12 (×5): 1 via ORAL
  Filled 2014-06-09 (×4): qty 1

## 2014-06-09 MED ORDER — CEFAZOLIN (ANCEF) 1 G IV SOLR
2.0000 g | INTRAVENOUS | Status: DC
  Filled 2014-06-09: qty 2

## 2014-06-09 MED ORDER — NALBUPHINE HCL 10 MG/ML IJ SOLN
5.0000 mg | Freq: Once | INTRAMUSCULAR | Status: AC | PRN
  Filled 2014-06-09: qty 0.5

## 2014-06-09 MED ORDER — DIPHENHYDRAMINE HCL 25 MG PO CAPS
25.0000 mg | ORAL_CAPSULE | ORAL | Status: DC | PRN
  Filled 2014-06-09: qty 1

## 2014-06-09 MED ORDER — FENTANYL CITRATE 0.05 MG/ML IJ SOLN
INTRAMUSCULAR | Status: DC | PRN
  Administered 2014-06-09: 15 ug via INTRATHECAL

## 2014-06-09 MED ORDER — MORPHINE SULFATE 0.5 MG/ML IJ SOLN
INTRAMUSCULAR | Status: AC
  Filled 2014-06-09: qty 10

## 2014-06-09 MED ORDER — KETOROLAC TROMETHAMINE 30 MG/ML IJ SOLN
INTRAMUSCULAR | Status: AC
  Filled 2014-06-09: qty 1

## 2014-06-09 MED ORDER — ZOLPIDEM TARTRATE 5 MG PO TABS
5.0000 mg | ORAL_TABLET | Freq: Every evening | ORAL | Status: DC | PRN
  Administered 2014-06-10 – 2014-06-11 (×2): 5 mg via ORAL
  Filled 2014-06-09 (×3): qty 1

## 2014-06-09 MED ORDER — LANOLIN HYDROUS EX OINT: 1.0000 "application " | TOPICAL_OINTMENT | CUTANEOUS | Status: DC | PRN

## 2014-06-09 MED ORDER — KETOROLAC TROMETHAMINE 30 MG/ML IJ SOLN
15.0000 mg | Freq: Once | INTRAMUSCULAR | Status: AC | PRN
  Administered 2014-06-09: 30 mg via INTRAVENOUS

## 2014-06-09 MED ORDER — ONDANSETRON HCL 4 MG/2ML IJ SOLN
INTRAMUSCULAR | Status: DC | PRN
  Administered 2014-06-09: 4 mg via INTRAVENOUS

## 2014-06-09 MED ORDER — SCOPOLAMINE 1 MG/3DAYS TD PT72
1.0000 | MEDICATED_PATCH | Freq: Once | TRANSDERMAL | Status: AC
Start: 2014-06-09 — End: 2014-06-12
  Administered 2014-06-09: 1.5 mg via TRANSDERMAL

## 2014-06-09 MED ORDER — FENTANYL CITRATE 0.05 MG/ML IJ SOLN
INTRAMUSCULAR | Status: AC
  Filled 2014-06-09: qty 2

## 2014-06-09 MED ORDER — ONDANSETRON HCL 4 MG/2ML IJ SOLN
INTRAMUSCULAR | Status: AC
  Filled 2014-06-09: qty 2

## 2014-06-09 MED ORDER — LACTATED RINGERS IV SOLN
INTRAVENOUS | Status: DC
  Administered 2014-06-09: 21:00:00 via INTRAVENOUS
  Administered 2014-06-10: 125 mL/h via INTRAVENOUS

## 2014-06-09 MED ORDER — MEPERIDINE HCL 25 MG/ML IJ SOLN: 6.2500 mg | INTRAMUSCULAR | Status: DC | PRN

## 2014-06-09 MED ORDER — ONDANSETRON HCL 4 MG/2ML IJ SOLN: 4.0000 mg | Freq: Three times a day (TID) | INTRAMUSCULAR | Status: DC | PRN

## 2014-06-09 MED ORDER — OXYTOCIN 40 UNITS IN LACTATED RINGERS INFUSION - SIMPLE MED
62.5000 mL/h | INTRAVENOUS | Status: AC
  Administered 2014-06-09: 62.5 mL/h via INTRAVENOUS

## 2014-06-09 MED ORDER — MORPHINE SULFATE (PF) 0.5 MG/ML IJ SOLN
INTRAMUSCULAR | Status: DC | PRN
  Administered 2014-06-09: .1 mg via INTRATHECAL

## 2014-06-09 MED ORDER — LABETALOL HCL 200 MG PO TABS
200.0000 mg | ORAL_TABLET | Freq: Two times a day (BID) | ORAL | Status: DC
  Administered 2014-06-09 – 2014-06-10 (×2): 200 mg via ORAL
  Filled 2014-06-09 (×2): qty 2

## 2014-06-09 MED ORDER — LABETALOL HCL 5 MG/ML IV SOLN
10.0000 mg | INTRAVENOUS | Status: DC | PRN
  Filled 2014-06-09: qty 4

## 2014-06-09 MED ORDER — TETANUS-DIPHTH-ACELL PERTUSSIS 5-2.5-18.5 LF-MCG/0.5 IM SUSP
0.5000 mL | Freq: Once | INTRAMUSCULAR | Status: AC
  Administered 2014-06-13: 0.5 mL via INTRAMUSCULAR
  Filled 2014-06-09: qty 0.5

## 2014-06-09 MED ORDER — PROMETHAZINE HCL 25 MG/ML IJ SOLN: 6.2500 mg | INTRAMUSCULAR | Status: DC | PRN

## 2014-06-09 MED ORDER — SENNOSIDES-DOCUSATE SODIUM 8.6-50 MG PO TABS
2.0000 | ORAL_TABLET | ORAL | Status: DC
  Administered 2014-06-09 – 2014-06-10 (×2): 2 via ORAL
  Filled 2014-06-09: qty 1
  Filled 2014-06-09 (×3): qty 2

## 2014-06-09 MED ORDER — PRENATAL MULTIVITAMIN CH
1.0000 | ORAL_TABLET | Freq: Every day | ORAL | Status: DC
  Administered 2014-06-10 – 2014-06-13 (×4): 1 via ORAL
  Filled 2014-06-09 (×4): qty 1

## 2014-06-09 MED ORDER — SIMETHICONE 80 MG PO CHEW
80.0000 mg | CHEWABLE_TABLET | ORAL | Status: DC
  Administered 2014-06-09 – 2014-06-12 (×4): 80 mg via ORAL
  Filled 2014-06-09 (×4): qty 1

## 2014-06-09 MED ORDER — DIBUCAINE 1 % RE OINT: 1.0000 "application " | TOPICAL_OINTMENT | RECTAL | Status: DC | PRN

## 2014-06-09 MED ORDER — KETOROLAC TROMETHAMINE 30 MG/ML IJ SOLN
30.0000 mg | Freq: Four times a day (QID) | INTRAMUSCULAR | Status: AC | PRN
  Filled 2014-06-09: qty 1

## 2014-06-09 MED ORDER — OXYTOCIN 10 UNIT/ML IJ SOLN
40.0000 [IU] | INTRAMUSCULAR | Status: DC | PRN
  Administered 2014-06-09: 40 [IU] via INTRAVENOUS

## 2014-06-09 MED ORDER — NALOXONE HCL 0.4 MG/ML IJ SOLN: 0.4000 mg | INTRAMUSCULAR | Status: DC | PRN

## 2014-06-09 MED ORDER — DEXTROSE 5 % IV SOLN
1.0000 ug/kg/h | INTRAVENOUS | Status: DC | PRN
  Filled 2014-06-09: qty 2

## 2014-06-09 MED ORDER — WITCH HAZEL-GLYCERIN EX PADS: 1.0000 "application " | MEDICATED_PAD | CUTANEOUS | Status: DC | PRN

## 2014-06-09 MED ORDER — OXYCODONE-ACETAMINOPHEN 5-325 MG PO TABS: 2.0000 | ORAL_TABLET | ORAL | Status: DC | PRN

## 2014-06-09 MED ORDER — MAGNESIUM SULFATE 40 MG/ML IJ SOLN: 2.0000 g | Freq: Once | INTRAMUSCULAR | Status: DC

## 2014-06-09 MED ORDER — FENTANYL CITRATE 0.05 MG/ML IJ SOLN: 25.0000 ug | INTRAMUSCULAR | Status: DC | PRN

## 2014-06-09 MED ORDER — PHENYLEPHRINE 8 MG IN D5W 100 ML (0.08MG/ML) PREMIX OPTIME
INJECTION | INTRAVENOUS | Status: AC
  Filled 2014-06-09: qty 100

## 2014-06-09 MED ORDER — MENTHOL 3 MG MT LOZG: 1.0000 | LOZENGE | OROMUCOSAL | Status: DC | PRN

## 2014-06-09 MED ORDER — OXYTOCIN 10 UNIT/ML IJ SOLN
INTRAMUSCULAR | Status: AC
  Filled 2014-06-09: qty 4

## 2014-06-09 MED ORDER — SIMETHICONE 80 MG PO CHEW: 80.0000 mg | CHEWABLE_TABLET | ORAL | Status: DC | PRN

## 2014-06-09 MED ORDER — PHENYLEPHRINE 8 MG IN D5W 100 ML (0.08MG/ML) PREMIX OPTIME
INJECTION | INTRAVENOUS | Status: DC | PRN
  Administered 2014-06-09: 20 ug/min via INTRAVENOUS
  Administered 2014-06-09: 60 ug/min via INTRAVENOUS

## 2014-06-09 MED ORDER — CEFAZOLIN SODIUM-DEXTROSE 2-3 GM-% IV SOLR
INTRAVENOUS | Status: DC | PRN
  Administered 2014-06-09: 2 g via INTRAVENOUS

## 2014-06-09 MED ORDER — 0.9 % SODIUM CHLORIDE (POUR BTL) OPTIME
TOPICAL | Status: DC | PRN
  Administered 2014-06-09: 300 mL

## 2014-06-09 SURGICAL SUPPLY — 33 items
ADH SKN CLS LQ APL DERMABOND (GAUZE/BANDAGES/DRESSINGS) ×2
CLAMP CORD UMBIL (MISCELLANEOUS) IMPLANT
CLOTH BEACON ORANGE TIMEOUT ST (SAFETY) ×3 IMPLANT
DERMABOND ADHESIVE PROPEN (GAUZE/BANDAGES/DRESSINGS) ×4
DERMABOND ADVANCED .7 DNX6 (GAUZE/BANDAGES/DRESSINGS) ×2 IMPLANT
DRAPE SHEET LG 3/4 BI-LAMINATE (DRAPES) IMPLANT
DRSG OPSITE POSTOP 4X10 (GAUZE/BANDAGES/DRESSINGS) ×3 IMPLANT
DURAPREP 26ML APPLICATOR (WOUND CARE) ×3 IMPLANT
ELECT REM PT RETURN 9FT ADLT (ELECTROSURGICAL) ×3
ELECTRODE REM PT RTRN 9FT ADLT (ELECTROSURGICAL) ×1 IMPLANT
EXTRACTOR VACUUM M CUP 4 TUBE (SUCTIONS) IMPLANT
EXTRACTOR VACUUM M CUP 4' TUBE (SUCTIONS)
GLOVE BIO SURGEON STRL SZ7 (GLOVE) ×3 IMPLANT
GOWN STRL REUS W/TWL LRG LVL3 (GOWN DISPOSABLE) ×6 IMPLANT
KIT ABG SYR 3ML LUER SLIP (SYRINGE) IMPLANT
LIQUID BAND (GAUZE/BANDAGES/DRESSINGS) IMPLANT
NEEDLE HYPO 25X5/8 SAFETYGLIDE (NEEDLE) IMPLANT
NS IRRIG 1000ML POUR BTL (IV SOLUTION) ×3 IMPLANT
PACK C SECTION WH (CUSTOM PROCEDURE TRAY) ×3 IMPLANT
PAD OB MATERNITY 4.3X12.25 (PERSONAL CARE ITEMS) ×3 IMPLANT
RTRCTR C-SECT PINK 25CM LRG (MISCELLANEOUS) ×3 IMPLANT
STAPLER VISISTAT 35W (STAPLE) IMPLANT
SUT CHROMIC 1 CTX 36 (SUTURE) ×6 IMPLANT
SUT CHROMIC 2 0 CT 1 (SUTURE) ×3 IMPLANT
SUT PDS AB 0 CTX 60 (SUTURE) ×6 IMPLANT
SUT PLAIN 2 0 XLH (SUTURE) ×3 IMPLANT
SUT VIC AB 0 CT1 36 (SUTURE) ×3 IMPLANT
SUT VIC AB 2-0 CT1 27 (SUTURE) ×3
SUT VIC AB 2-0 CT1 TAPERPNT 27 (SUTURE) ×1 IMPLANT
SUT VIC AB 4-0 KS 27 (SUTURE) ×3 IMPLANT
TOWEL OR 17X24 6PK STRL BLUE (TOWEL DISPOSABLE) ×3 IMPLANT
TRAY FOLEY CATH 14FR (SET/KITS/TRAYS/PACK) ×3 IMPLANT
WATER STERILE IRR 1000ML POUR (IV SOLUTION) ×3 IMPLANT

## 2014-06-09 NOTE — Progress Notes (Signed)
Patient ID: Michele Rojas, female   DOB: 1986-04-21, 28 y.o.   MRN: [id number]   Overnight Pts BPs have been improved with IV magnesium. Pt is up 3 L since yesterday. Platelets dropped from 136 to 127. Pt with persistent neurologic symptoms. Pt has continued to have spontaneous decelerations overnight. Given worsening pre-eclampsia will proceed with delivery. Fetus in breech presentation. Will proceed with cesarean. R/B/A reviewed with the patient, consent obtained

## 2014-06-09 NOTE — Op Note (Signed)
Pre-Operative Diagnosis: 1) 31+2 week intrauterine pregnancy 2) Pre-eclampsia with severe features with deterioration of maternal status 3) Frank breech presentation Postoperative Diagnosis: Same Procedure: Primary Low Transverse Cesarean Section Surgeon: Dr. Vanessa Kick Assistant: None Operative Findings: Vigorous female infant in the frank breech presentation weighing 3#5 oz, 1500gm. Normal appearing ovaries and tubes. Placenta with marginal cord insertion. With a half dollar size area of clot and necrosis. There are smaller clots scattered throughout the parenchyma of the placenta. Specimen: Placenta to pathology EBL: Total I/O In: 725 [I.V.:725] Out: 1100 [Urine:300; Blood:800]   Procedure:Michele Rojas is an 28 year old gravida 1 para 0 at 48 weeks and 2 days estimated gestational age who presents for cesarean section. The patient has been hospitalized since 05/28/14 with elevated BPs. Her condition has slowly deteriorated and over the last 24 hours her blood pressures have become severe range requiring IV antihypertensives. She has also delivered neurologic symptoms and the fetus has developed signs of placental dysfunction with spontaneous, intermittent decelerations. Given the decompensation of the maternal condition the decision was made to proceed with delivery. Due to frank breech presentation cesarean was required. Following the appropriate informed consent the patient was brought to the operating room where spinal anesthesia was administered and found to be adequate. She was placed in the dorsal supine position with a leftward tilt. She was prepped and draped in the normal sterile fashion. Scalpel was then used to make a Pfannenstiel skin incision which was carried down to the underlying layers of soft tissue to the fascia. The fascia was incised in the midline and the fascial incision was extended laterally with Mayo scissors. The superior aspect of the fascial incision was grasped with Coker  clamps x2, tented up and the rectus muscles dissected off sharply with the electrocautery unit area and the same procedure was repeated on the inferior aspect of the fascial incision. The rectus muscles were separated in the midline. The abdominal peritoneum was identified, tented up, entered sharply, and the incision was extended superiorly and inferiorly with good visualization of the bladder. The Alexis retractor was then deployed. The vesicouterine peritoneum was identified, tented up, entered sharply, and the bladder flap was created digitally. Scalpel was then used to make a low transverse incision on the uterus which was extended laterally with blunt dissection. The fetal breech was identified, delivered easily through the uterine incision followed by the body. The infant was bulb suctioned on the operative field cried vigorously, cord was clamped and cut and the infant was passed to the waiting neonatologist. Placenta was then delivered spontaneously, the uterus was cleared of all clot and debris. The uterine incision was repaired with #1 chromic in running locked fashion followed by a second imbricating layer. Ovaries and tubes were inspected and normal. The Alexis retractor was removed. The abdominal peritoneum was reapproximated with 2-0 Vicryl in a running fashion, the rectus muscles was reapproximated with #1 chromic in a running fashion. The fascia was closed with a looped PDS in a running fashion. The skin was closed with 4-0 vicryl in a subcuticular fashion and Dermabon. All sponge lap and needle counts were correct x2. Patient tolerated the procedure well and recovered in stable condition following the procedure.

## 2014-06-09 NOTE — Anesthesia Procedure Notes (Signed)
Spinal Patient location during procedure: OR Start time: 06/09/2014 9:40 AM Staffing Anesthesiologist: CASSIDY, AMY Performed by: anesthesiologist  Preanesthetic Checklist Completed: patient identified, site marked, surgical consent, pre-op evaluation, timeout performed, IV checked, risks and benefits discussed and monitors and equipment checked Spinal Block Patient position: sitting Prep: site prepped and draped and DuraPrep Patient monitoring: heart rate, cardiac monitor, continuous pulse ox and blood pressure Approach: midline Location: L3-4 Injection technique: single-shot Needle Needle type: Pencan  Needle gauge: 24 G Needle length: 9 cm Assessment Sensory level: T4 Additional Notes Clear free flow CSF on first attempt.  Transient right paresthesia.  Patient tolerated procedure well with no apparent complications.  Charlton Haws, MD

## 2014-06-09 NOTE — Progress Notes (Signed)
Daylight savings time. Time went back 1 hour.

## 2014-06-09 NOTE — Transfer of Care (Signed)
Immediate Anesthesia Transfer of Care Note  Patient: Michele Rojas  Procedure(s) Performed: Procedure(s): CESAREAN SECTION (N/A)  Patient Location: PACU  Anesthesia Type:Spinal  Level of Consciousness: awake  Airway & Oxygen Therapy: Patient Spontanous Breathing  Post-op Assessment: Report given to PACU RN  Post vital signs: Reviewed and stable  Complications: No apparent anesthesia complications

## 2014-06-09 NOTE — Anesthesia Postprocedure Evaluation (Signed)
  Anesthesia Post-op Note  Anesthesia Post Note  Patient: Michele Rojas  Procedure(s) Performed: Procedure(s) (LRB): CESAREAN SECTION (N/A)  Anesthesia type: Spinal  Patient location: PACU  Post pain: Pain level controlled  Post assessment: Post-op Vital signs reviewed  Last Vitals:  Filed Vitals:   06/09/14 1115  BP: 134/87  Pulse: 54  Temp:   Resp: 12    Post vital signs: Reviewed  Level of consciousness: awake  Complications: No apparent anesthesia complications

## 2014-06-09 NOTE — Lactation Note (Signed)
This note was copied from the chart of [place]. Lactation Consultation Note     Initial consult with this mom of a NICU baby, now 76 hours old and 31 2/[redacted] weeks gestation. Mom has severe pre eclampsia, on magnesium drip in antenatal. Mom was started pumping within 5 hours of delivery, and I did teching with her from the nICU booklet, reviewed lactation services and showed her hwo to hand express. Mom was able to express about 0.4 mls of colostrum, and dad brought this to the baby. Mom knows to call for questions/concerns.   Patient Name: Michele Rojas [id number] Date: 06/09/2014 Reason for consult: Initial assessment;NICU baby;Infant < 6lbs   Maternal Data Formula Feeding for Exclusion: Yes (baby in NICU) Reason for exclusion: Admission to Intensive Care Unit (ICU) post-partum Has patient been taught Hand Expression?: Yes Does the patient have breastfeeding experience prior to this delivery?: No  Feeding    LATCH Score/Interventions                      Lactation Tools Discussed/Used WIC Program: No Pump Review: Setup, frequency, and cleaning;Milk Storage;Other (comment) (hand expression, NICU booklet, premie setting) Initiated by:: bedside rn within 5 hours of delivery Date initiated:: 06/09/14   Consult Status Consult Status: Follow-up Date: 06/10/14 Follow-up type: In-patient    [name] 06/09/2014, 4:05 PM

## 2014-06-10 ENCOUNTER — Encounter (HOSPITAL_COMMUNITY): Payer: Self-pay | Admitting: Obstetrics and Gynecology

## 2014-06-10 LAB — CBC
HCT: 34.9 % — ABNORMAL LOW (ref 36.0–46.0)
Hemoglobin: 12 g/dL (ref 12.0–15.0)
MCH: 31.7 pg (ref 26.0–34.0)
MCHC: 34.4 g/dL (ref 30.0–36.0)
MCV: 92.1 fL (ref 78.0–100.0)
PLATELETS: 148 10*3/uL — AB (ref 150–400)
RBC: 3.79 MIL/uL — AB (ref 3.87–5.11)
RDW: 14.4 % (ref 11.5–15.5)
WBC: 16 10*3/uL — ABNORMAL HIGH (ref 4.0–10.5)

## 2014-06-10 LAB — COMPREHENSIVE METABOLIC PANEL
ALBUMIN: 1.9 g/dL — AB (ref 3.5–5.2)
ALT: 14 U/L (ref 0–35)
AST: 29 U/L (ref 0–37)
Alkaline Phosphatase: 84 U/L (ref 39–117)
Anion gap: 13 (ref 5–15)
BUN: 8 mg/dL (ref 6–23)
CALCIUM: 8.1 mg/dL — AB (ref 8.4–10.5)
CO2: 23 mEq/L (ref 19–32)
CREATININE: 0.6 mg/dL (ref 0.50–1.10)
Chloride: 102 mEq/L (ref 96–112)
GFR calc Af Amer: >90 mL/min (ref >90)
GFR calc non Af Amer: >90 mL/min (ref >90)
Glucose, Bld: 99 mg/dL (ref 70–99)
Potassium: 4.1 mEq/L (ref 3.7–5.3)
SODIUM: 138 meq/L (ref 137–147)
Total Bilirubin: <0.2 mg/dL — ABNORMAL LOW (ref 0.3–1.2)
Total Protein: 5.6 g/dL — ABNORMAL LOW (ref 6.0–8.3)

## 2014-06-10 LAB — MAGNESIUM: MAGNESIUM: 4.7 mg/dL — AB (ref 1.5–2.5)

## 2014-06-10 MED ORDER — LABETALOL HCL 300 MG PO TABS
300.0000 mg | ORAL_TABLET | Freq: Two times a day (BID) | ORAL | Status: DC
  Administered 2014-06-10 – 2014-06-12 (×5): 300 mg via ORAL
  Filled 2014-06-10 (×6): qty 1

## 2014-06-10 NOTE — Addendum Note (Signed)
Addendum  created 06/10/14 0826 by Jonna Munro, CRNA   Modules edited: Notes Section   Notes Section:  File: [id number]

## 2014-06-10 NOTE — Progress Notes (Signed)
Pt getting her things together to be transferred to 3rd floor

## 2014-06-10 NOTE — Anesthesia Postprocedure Evaluation (Signed)
  Anesthesia Post-op Note  Patient: Michele Rojas  Procedure(s) Performed: Procedure(s): CESAREAN SECTION (N/A)  Patient Location: Antenatal  Anesthesia Type:Spinal  Level of Consciousness: awake, alert  and oriented  Airway and Oxygen Therapy: Patient Spontanous Breathing  Post-op Pain: none  Post-op Assessment: Post-op Vital signs reviewed, Patient's Cardiovascular Status Stable, Respiratory Function Stable, Pain level controlled, No headache, No backache, No residual numbness and No residual motor weakness  Post-op Vital Signs: Reviewed and stable  Last Vitals:  Filed Vitals:   06/10/14 0802  BP:   Pulse: 70  Temp:   Resp:     Complications: No apparent anesthesia complications

## 2014-06-10 NOTE — Progress Notes (Signed)
  Patient is eating, ambulating.  Pain control is appropriate. No ha/bv/ruq pain.  Is still on Mg for seizure ppx.  Has started to diurese well overnight.   Filed Vitals:   06/10/14 0752 06/10/14 0757 06/10/14 0800 06/10/14 0802  BP:   153/86   Pulse: 64 62 73 70  Temp:   98.2 F (36.8 C)   TempSrc:   Oral   Resp:   18   Height:      Weight:      SpO2: 97% 97%  100%   Uop: 1300cc/8hr   lungs:   clear to auscultation cardica:  RRR Abdomen:  soft, appropriate tenderness, incisions intact and without erythema or exudate ex:    1+ edema b/l, symmetric, DTRs 3+   Lab Results  Component Value Date   WBC 16.0* 06/10/2014   HGB 12.0 06/10/2014   HCT 34.9* 06/10/2014   MCV 92.1 06/10/2014   PLT 148* 06/10/2014    --/--/A NEG (11/01 0800)   A/P    G1P0101 POD#1 s/p 1 LTCS 2/2 preeclampsia w severe features. Preeclampsia: BPs mild range yesterday.  This AM are150s/70-80ss.  Currently on labetalol 200mg  bid.  Will monitor today.  If persistently >140/90 will increase dose.  Mild thrombocytopenia w naidr 121.  Plt this AM 148.  On Mg for seizure ppx for 24 hr post op.  D/c at 1000.    Rh GNO:IBBC Rh neg.  Rhogam not indicated.

## 2014-06-10 NOTE — Progress Notes (Signed)
Ur chart review completed.  

## 2014-06-10 NOTE — Progress Notes (Signed)
Transferred to rm 305 via w/c

## 2014-06-11 LAB — RPR

## 2014-06-11 NOTE — Lactation Note (Signed)
This note was copied from the chart of [place]. Lactation Consultation Note  Follow up visit made.  Mom is tearful due to concern about baby and his cardiac problems.  She states pumping is going well and last obtained 10-15 mls.  Mom states breasts are feeling heavier.  Instructed mom to change pumping setting to standard.  Parents shown how to adjust setting.  Mom has a DEBP at home.  LC will follow up tomorrow.  Patient Name: Michele Rojas Today's Date: 06/11/2014     Maternal Data    Feeding    LATCH Score/Interventions                      Lactation Tools Discussed/Used     Consult Status      Ave Filter 06/11/2014, 5:10 PM

## 2014-06-11 NOTE — Plan of Care (Signed)
Problem: Injury Prevention - Hypertension Goal: Assess per MD/Nurse,Routine-VS,FHR,UC,Head to Toe assess Outcome: Not Met (add Reason) Some elevations noted  Will notify MD if  Needed

## 2014-06-11 NOTE — Progress Notes (Signed)
  Patient is eating, ambulating, voiding.  Pain control is good.  Filed Vitals:   06/10/14 1545 06/10/14 2207 06/11/14 0149 06/11/14 0603  BP: 149/84 161/83 146/80 153/87  Pulse: 78 68 74 66  Temp: 98.4 F (36.9 C) 99 F (37.2 C) 99 F (37.2 C) 98.7 F (37.1 C)  TempSrc: Oral Oral Oral Oral  Resp: 20 18 18 18   Height:      Weight:    98.771 kg (217 lb 12 oz)  SpO2: 99% 98% 96% 96%    lungs:   clear to auscultation cor:    RRR Abdomen:  soft, appropriate tenderness, incisions intact and without erythema or exudate ex:    no cords   Lab Results  Component Value Date   WBC 16.0* 06/10/2014   HGB 12.0 06/10/2014   HCT 34.9* 06/10/2014   MCV 92.1 06/10/2014   PLT 148* 06/10/2014    --/--/A NEG (11/01 0800)/RI  A/P    Post operative day 2 with severe preeclampsia.  Will D/c magnesium sulfate and watch BPs.   Routine post op and postpartum care.  Expect d/c tomorrow.  Percocet for pain control.

## 2014-06-11 NOTE — Progress Notes (Signed)
Baby is stable in NICU.

## 2014-06-11 NOTE — Progress Notes (Signed)
   06/11/14 1600  Clinical Encounter Type  Visited With Patient and family together Michele Rojas and husband Michele Rojas in NICU)  Visit Type Initial;Spiritual support;Social support  Referral From Nurse Vonzella Nipple, RN)  Spiritual Encounters  Spiritual Needs Emotional  Stress Factors  Patient Stress Factors Loss of control;Major life changes (baby's unexpected dx)   Visited with pt and husband Michele Rojas in NICU as [name] held baby skin-to-skin.  She was slightly tearful.  Couple reports good family support and was appreciative to learn of chaplain availability for emotional support.  Provided pastoral presence, reflective listening, encouragement, and introduction to Spiritual Care.  Will follow as we see family in NICU, but please also page as needs arise:  [phone].  Thank you.  [place], [place]

## 2014-06-11 NOTE — Progress Notes (Signed)
Clinical Social Work Department PSYCHOSOCIAL ASSESSMENT - MATERNAL/CHILD 06/11/2014  Patient:  Michele Rojas,Michele Rojas  Account Number:  [id number]  Admit Date:  05/28/2014  Childs Name:   Michele Rojas    Clinical Social Worker:  Doyne Micke, LCSW   Date/Time:  06/11/2014 01:00 AM  Date Referred:        Other referral source:   No referral-NICU admission    I:  FAMILY / HOME ENVIRONMENT Child's legal guardian:  PARENT  Guardian - Name Guardian - Age Guardian - Address  Avika Spindel [address] [place], [place] [address]  Brian Muntean  same   Other household support members/support persons Other support:   Parents state they have a good support system-Did not discuss in depth.    II  PSYCHOSOCIAL DATA Information Source:  Family Interview  Financial and Community Resources Employment:   Did not discuss at this time.   Financial resources:  Private Insurance If Medicaid - County:    School / Grade:   Maternity Care Coordinator / Child Services Coordination / Early Interventions:  Cultural issues impacting care:   None stated    III  STRENGTHS Strengths  Adequate Resources  Compliance with medical plan  Supportive family/friends  Understanding of illness   Strength comment:    IV  RISK FACTORS AND CURRENT PROBLEMS Current Problem:  YES   Risk Factor & Current Problem Patient Issue Family Issue Risk Factor / Current Problem Comment  Adjustment to Illness Y N    N N     V  SOCIAL WORK ASSESSMENT  CSW met with parents in MOB's room to introduce myself, complete assessment and offer support due to baby's admission to NICU at 31 weeks.  MOB was sitting up in bed and extremely tearful when CSW entered.  FOB was seemingly calm and on the couch. They welcomed CSW into the room, but were both very quiet.  CSW introduced CSW support services.  MOB did not think she could talk at this time, but agreed to hear information from CSW.  FOB was able to talk and appears to have a  good understanding of baby's medical condition.  Both parents seem very upset by the news of baby's heart condition.  CSW validated their emotions and concerns and discussed common reactions/emotions related to a premature birth/receiving difficult news.  CSW also discussed signs and symptoms of PPD.  CSW notes that MOB has a hx of depression noted in her chart.  She states she was on Wellbutrin until about 1.5 years ago.  CSW encouraged MOB and FOB to discuss the possibility of restarting medication at this time since she has just been through a traumatic experience with the birth of a premature baby, is facing a NICU experience with complications of her baby having a heart condition and has a hx of depression.  CSW assured her that it is her and her husband's decision, but again encouraged her to think about it.  CSW pointed out that if she experiences symptoms of depression later on, and decides to start medication at that time, she will have to wait 4-6 weeks for the medication to get to a therapeutic level in her system.  Therefore, if she starts medication now and experiences symptoms, the medication has already had time to build up in her system.  She states she would rather not go back on medication, but will think about it.  She states she received medication management from a doctor at [facility] and could   return if she chooses.  She states she had a counselor briefly, but did not return after about a month.  CSW is unsure when this was.  CSW explained counseling services provided by CSW while baby is in the NICU and encouraged parents to utilize this service as a coping strategy.  CSW provided parents with contact information and thanked them for allowing CSW to speak with them today.  Parents were pleasant and appreciative.  They state no questions or needs at this time.     VI SOCIAL WORK PLAN Social Work Therapist, art  Psychosocial Support/Ongoing Assessment of Needs    Type of pt/family education:   PPD signs and sypmtoms  Ongoing support services offered by NICU CSW   If child protective services report - county:   If child protective services report - date:   Information/referral to community resources comment:   No referral needs noted at this time, however, CSW recommends counseling as a coping mechanisms to the difficult situation that MOB faces with having baby in the NICU.   Other social work plan:

## 2014-06-11 NOTE — Plan of Care (Signed)
Problem: Phase I Progression Outcomes Goal: Pain controlled with appropriate interventions Outcome: Completed/Met Date Met:  06/11/14 Goal: Voiding adequately Outcome: Completed/Met Date Met:  06/11/14 Goal: Foley catheter patent Outcome: Not Applicable Date Met:  63/84/66 Goal: OOB as tolerated unless otherwise ordered Outcome: Completed/Met Date Met:  06/11/14  Problem: Phase II Progression Outcomes Goal: Pain controlled on oral analgesia Outcome: Completed/Met Date Met:  06/11/14 Goal: Progress activity as tolerated unless otherwise ordered Outcome: Completed/Met Date Met:  06/11/14 Goal: Rh isoimmunization per orders Outcome: Not Applicable Date Met:  59/93/57 Goal: Tolerating diet Outcome: Completed/Met Date Met:  06/11/14

## 2014-06-12 ENCOUNTER — Ambulatory Visit (HOSPITAL_COMMUNITY): Payer: Managed Care, Other (non HMO)

## 2014-06-12 MED ORDER — LABETALOL HCL 300 MG PO TABS
300.0000 mg | ORAL_TABLET | Freq: Three times a day (TID) | ORAL | Status: DC
  Administered 2014-06-12 – 2014-06-13 (×2): 300 mg via ORAL
  Filled 2014-06-12 (×2): qty 1
  Filled 2014-06-12: qty 1.5
  Filled 2014-06-12 (×2): qty 1

## 2014-06-12 NOTE — Progress Notes (Signed)
Ur chart review completed.  

## 2014-06-12 NOTE — Plan of Care (Signed)
Problem: Consults Goal: Skin Care Protocol Initiated - if Braden Score 18 or less If consults are not indicated, leave blank or document N/A  Outcome: Completed/Met Date Met:  06/12/14 Goal: Nutrition Consult-if indicated Outcome: Not Applicable Date Met:  72/90/21  Problem: Phase I Progression Outcomes Goal: IS, TCDB as ordered Outcome: Completed/Met Date Met:  06/12/14 Goal: VS, stable, temp < 100.4 degrees F Outcome: Completed/Met Date Met:  06/12/14 Goal: Initial discharge plan identified Outcome: Completed/Met Date Met:  06/12/14 Goal: Other Phase I Outcomes/Goals Outcome: Not Applicable Date Met:  11/55/20

## 2014-06-12 NOTE — Lactation Note (Signed)
This note was copied from the chart of [place]. Lactation Consultation Note  Follow up visit made.  Mom very teary due to baby's condition.  She states pumping is going well.  She is obtaining 15-20 mls from each breast.  Mom anticipating discharge tomorrow.  I offered reviewing her pump from home with her prior to discharge if family member brings it in.   Will follow up in AM.  Patient Name: Michele Rojas Today's Date: 06/12/2014     Maternal Data    Feeding Feeding Type: Breast Milk  LATCH Score/Interventions                      Lactation Tools Discussed/Used     Consult Status      Ave Filter 06/12/2014, 1:16 PM

## 2014-06-12 NOTE — Progress Notes (Signed)
Patient is eating, ambulating, voiding.  Pain control is good.  Appropriate lochia.  No HA/vision change/RUQ pain.  Emotionally doing ok, and baby doing ok.    Filed Vitals:   06/12/14 0545 06/12/14 1048 06/12/14 1301 06/12/14 1326  BP: 146/82 168/88 148/87   Pulse: 78 78 73   Temp: 98.4 F (36.9 C) 98.8 F (37.1 C)  98.2 F (36.8 C)  TempSrc: Oral Oral  Oral  Resp: 18 18 18    Height:      Weight: 97.526 kg (215 lb 0.1 oz)     SpO2: 96% 99% 98%     [employer]: c/d/i Ext: +2 edema, no CT  Lab Results  Component Value Date   WBC 16.0* 06/10/2014   HGB 12.0 06/10/2014   HCT 34.9* 06/10/2014   MCV 92.1 06/10/2014   PLT 148* 06/10/2014    --/--/A NEG (11/01 0800)  A/P Post op day #3 s/p c/s preeclampsia with severe features with frank breech presentation. Severe range BPs necessitating increase in labetalol to 300bid yesterday.  So far mild range today.  Will monitor overnight.  Routine care.  Expect d/c 06/13/2014    Allyn Kenner

## 2014-06-12 NOTE — Progress Notes (Signed)
Michele Rojas was tearful today and I provided support as she continues to make sense out of what is happening with her baby.  I encouraged her to seek support from her family and encouraged them to consider her husband taking some time off now (he is hoping to save his time off for when baby comes home) if possible so that they could navigate these first days of coping together.  We will continue to follow up as we are able, but please also page as needs arise.  [place] Pager, [phone]  8:52 PM    06/12/14 2000  Clinical Encounter Type  Visited With Patient  Visit Type Spiritual support;Follow-up  Referral From Nurse

## 2014-06-13 LAB — TYPE AND SCREEN
ABO/RH(D): A NEG
ANTIBODY SCREEN: POSITIVE
DAT, IgG: NEGATIVE
Unit division: 0
Unit division: 0

## 2014-06-13 MED ORDER — FLUOXETINE HCL 20 MG PO CAPS
20.0000 mg | ORAL_CAPSULE | Freq: Every day | ORAL | Status: DC
  Administered 2014-06-13: 20 mg via ORAL
  Filled 2014-06-13 (×2): qty 1

## 2014-06-13 MED ORDER — FLUOXETINE HCL 20 MG PO CAPS: 20.0000 mg | ORAL_CAPSULE | Freq: Every day | ORAL | Status: DC

## 2014-06-13 MED ORDER — LABETALOL HCL 300 MG PO TABS: 300.0000 mg | ORAL_TABLET | Freq: Three times a day (TID) | ORAL | Status: DC

## 2014-06-13 MED ORDER — OXYCODONE-ACETAMINOPHEN 5-325 MG PO TABS: 1.0000 | ORAL_TABLET | ORAL | Status: DC | PRN

## 2014-06-13 MED ORDER — ZOLPIDEM TARTRATE 5 MG PO TABS: 5.0000 mg | ORAL_TABLET | Freq: Every evening | ORAL | Status: DC | PRN

## 2014-06-13 NOTE — Lactation Note (Signed)
This note was copied from the chart of [place]. Lactation Consultation Note; Mother has been in NICU most of the morning with her infant. She states that she is pumping every 2-3 hours and is getting 20-30 ml. Mother is currently pumping. Observed transitioning milk. Mothers breast are filling. She states her milk starting coming in last night. Advised mother in treatment/prevention of engorgement.  Mother has a PIS Advance pump from her [employer]. Reviewed mothers pump with her. Advised mother in transporting collected milk to NICU. Mother is tearful . She was given support. Advised mother to phone [facility] services as needed.   Patient Name: Boy Lafern Brinkley Today's Date: 06/13/2014     Maternal Data    Feeding Feeding Type: Breast Milk  LATCH Score/Interventions                      Lactation Tools Discussed/Used     Consult Status      Darla Lesches 06/13/2014, 12:23 PM

## 2014-06-13 NOTE — Progress Notes (Signed)
Teaching reviewed  Pt ambulated out  Baby in nicu

## 2014-06-13 NOTE — Progress Notes (Signed)
  Patient is eating, ambulating, voiding.  Pain control is good.  Filed Vitals:   06/12/14 1800 06/12/14 2302 06/13/14 0250 06/13/14 0617  BP: 174/95 157/86 129/77 149/86  Pulse: 80 76 68 70  Temp: 99.1 F (37.3 C) 98.7 F (37.1 C) 98.2 F (36.8 C) 98.6 F (37 C)  TempSrc: Oral Oral Oral Oral  Resp: 18 18 18 18   Height:      Weight:    95.709 kg (211 lb)  SpO2: 100% 97% 97% 96%    lungs:   clear to auscultation cor:    RRR Abdomen:  soft, appropriate tenderness, incisions intact and without erythema or exudate ex:    no cords   Lab Results  Component Value Date   WBC 16.0* 06/10/2014   HGB 12.0 06/10/2014   HCT 34.9* 06/10/2014   MCV 92.1 06/10/2014   PLT 148* 06/10/2014    --/--/A NEG (11/01 0800)/RI  A/P    Post operative day 4.  Pt had only one BP with systolic severe range- all BPs otherwise stable.  Pt is emotionally distraught- will begin Prozac 20 mg po QD.  RTC beginning of next week for BP check.   Routine post op and postpartum care.  Expect d/c today.  Percocet for pain control. Baby is RH NEG- no Rhogam.

## 2014-06-14 NOTE — Progress Notes (Signed)
Post discharge chart review completed.  

## 2014-06-17 ENCOUNTER — Ambulatory Visit: Payer: Self-pay

## 2014-06-17 NOTE — Lactation Note (Signed)
This note was copied from the chart of [place]. Lactation Consultation Note   Follow up consult with this mom of a NICU baby, now 20 days old. I was told by Tomasa Rand, NNP, that mom was started on Prozac. I told mom I was told, and was glad she had started taking this, if needed, and that she is more important  than her milk. At the same time though, i explained that Prozac is safe to take with breast feeding, as per the [facility] Breast milk and medication book. Mom teary eyed. I told her I hoped she would soon be feeling better, and to let me know if there was anything I could do for her.   Patient Name: Michele Rojas [id number] Date: 06/17/2014 Reason for consult: Follow-up assessment   Maternal Data    Feeding Feeding Type: Breast Milk Length of feed: 30 min  LATCH Score/Interventions                      Lactation Tools Discussed/Used     Consult Status Consult Status: PRN Follow-up type: In-patient (NICU)    [name] 06/17/2014, 4:34 PM

## 2014-06-17 NOTE — Lactation Note (Signed)
This note was copied from the chart of [place]. Lactation Consultation Note    Follow up consult with this mom of a NICU baby, now 76 days old, and 32 3/7 weeks CGA. Mom is doing well with milk supply, pumping about 60 mls, but only about 5-6 times a day. I encouraged mom to increase her frequency of pumping to at least 8 times a day, explaining how the first 14 days post partum will effect her milk supply more than any other time. MOm receptive t teaching, and knows to call for questions, concerns.   Patient Name: Michele Rojas [id number] Date: 06/17/2014 Reason for consult: Follow-up assessment;NICU baby;Infant < 6lbs   Maternal Data    Feeding Feeding Type: Breast Milk Length of feed: 30 min  LATCH Score/Interventions                      Lactation Tools Discussed/Used Pump Review: Setup, frequency, and cleaning   Consult Status Consult Status: PRN Follow-up type: In-patient (NICU)    [name] 06/17/2014, 11:13 AM

## 2014-06-25 ENCOUNTER — Ambulatory Visit: Payer: Self-pay

## 2014-06-25 NOTE — Lactation Note (Signed)
This note was copied from the chart of [place]. Lactation Consultation Note  Follow up visit made with mom in NICU.  She is concerned she is obtaining 60-90 mls during the day and 30 mls at night.  Explained that milk supply varies during a 24 hour period.  Mom has a good stored milk supply.  Recommended she pump when able every 2 hours during the day to increase supply.  Reminded to relax during pumping and drink plenty of fluids.  Will continue to follow and encouraged to call with concerns.  Patient Name: Michele Rojas [id number] Date: 06/25/2014     Maternal Data    Feeding Feeding Type: Breast Milk Length of feed: 30 min  LATCH Score/Interventions                      Lactation Tools Discussed/Used     Consult Status      Ave Filter 06/25/2014, 12:40 PM

## 2014-06-28 NOTE — Discharge Summary (Signed)
Obstetric Discharge Summary Reason for Admission: cesarean section Prenatal Procedures: NST and Preeclampsia Intrapartum Procedures: cesarean: low cervical, transverse Postpartum Procedures: none Complications-Operative and Postpartum: none HEMOGLOBIN  Date Value Ref Range Status  06/10/2014 12.0 12.0 - 15.0 g/dL Final   HCT  Date Value Ref Range Status  06/10/2014 34.9* 36.0 - 46.0 % Final   Discharge Diagnoses: Term Pregnancy-delivered and Preelampsia  Discharge Information: Date: 06/28/2014 Activity: pelvic rest Diet: routine Medications: Iron and Percocet Condition: stable Instructions: refer to practice specific booklet Discharge to: home   Newborn Data: Live born female  Birth Weight: 3 lb 5.6 oz (1520 g) APGAR: 6, 7  Home with mother.  Michele Rojas A 06/28/2014, 1:40 AM

## 2014-07-01 ENCOUNTER — Ambulatory Visit: Payer: Self-pay

## 2014-07-01 NOTE — Lactation Note (Signed)
This note was copied from the chart of [place]. Lactation Consultation Note  Follow up visit made with mom in NICU.  She states when she is here visiting her baby she pumps 60-90 mls but when she is home she only pumps up to 30 mls.  I explained to mom this could be due to poor letdown when away from the baby and feeling anxious.  Mom states this could be true.  Recommended she bring the clothes home baby is wearing to have a scent of her baby at home.  Also discussed heat and massage prior to pumping.  Stressed importance of relaxation for better letdown.  Feeding assist scheduled for tomorrow at 2 00PM.  Patient Name: Michele Rojas Today's Date: 07/01/2014     Maternal Data    Feeding Feeding Type: Breast Milk Nipple Type: Slow - flow Length of feed: 20 min  LATCH Score/Interventions                      Lactation Tools Discussed/Used     Consult Status      Ave Filter 07/01/2014, 2:03 PM

## 2014-07-02 ENCOUNTER — Ambulatory Visit: Payer: Self-pay

## 2014-07-02 NOTE — Lactation Note (Signed)
This note was copied from the chart of [place]. Lactation Consultation Note     Follow up consult with this mom of a NICU baby, now 67 weeks old, nad 34 4/7 weeks CGA. Mom was putting Bo to the breast for the second time. He had been taking full bottles by po. I assisted mom with STS in cross cradle hold. Mom still tense with handling of the baby, but did better by end of feeding. He would latch but not suckle until I added a 20 nipple shield. He was still sleepy, but did well with about 6 mls of EBM placed in shiled by curved tip syringe. He was then fed via ng for remainder of feeding, and he intermittently suckled at the breast during this time. Mom and dad pleased with how he did. I spoke to them about him be a pre term baby, and that how he reacts aat the breast for now, is perfectly normal, and that full breast feeding will be slowly transitioned over the next 6 weeks or more.   Patient Name: Michele Rojas UUVOZ'D Date: 07/02/2014 Reason for consult: Follow-up assessment   Maternal Data    Feeding Feeding Type: Breast Fed Nipple Type: Slow - flow  LATCH Score/Interventions Latch: Repeated attempts needed to sustain latch, nipple held in mouth throughout feeding, stimulation needed to elicit sucking reflex. (few suckles after nipple shiled flled with EBM, but baby mosly sleepy at breast, fed ng during this time also) Intervention(s): Skin to skin;Teach feeding cues;Waking techniques Intervention(s): Adjust position;Assist with latch;Breast massage;Breast compression  Audible Swallowing: None (while EBm fed into shield) Intervention(s): Skin to skin;Hand expression  Type of Nipple: Everted at rest and after stimulation  Comfort (Breast/Nipple): Soft / non-tender     Hold (Positioning): Assistance needed to correctly position infant at breast and maintain latch. Intervention(s): Breastfeeding basics reviewed;Support Pillows;Position options;Skin to skin  LATCH Score:  6  Lactation Tools Discussed/Used     Consult Status Consult Status: PRN Follow-up type: In-patient (NICU)    [name] 07/02/2014, 3:31 PM

## 2014-07-09 ENCOUNTER — Ambulatory Visit: Payer: Self-pay

## 2014-07-09 NOTE — Lactation Note (Signed)
This note was copied from the chart of [place]. Lactation Consultation Note Visit with mom at baby's bedside in NICU.  She states she is weaning from pumping because it was too stressful.  Supported mom with her decision.  Reviewed weaning slowly by eliminating a pumping every few days.  Patient Name: Michele Rojas Today's Date: 07/09/2014     Maternal Data    Feeding    LATCH Score/Interventions                      Lactation Tools Discussed/Used     Consult Status      Ave Filter 07/09/2014, 2:51 PM

## 2015-02-17 ENCOUNTER — Ambulatory Visit (HOSPITAL_COMMUNITY): Payer: Managed Care, Other (non HMO) | Admitting: Anesthesiology

## 2015-02-17 ENCOUNTER — Ambulatory Visit (HOSPITAL_COMMUNITY)
Admission: RE | Admit: 2015-02-17 | Discharge: 2015-02-17 | Disposition: A | Discharge status: Home / Self Care | Payer: Managed Care, Other (non HMO) | Source: Ambulatory Visit | Attending: Obstetrics & Gynecology | Admitting: Obstetrics & Gynecology

## 2015-02-17 ENCOUNTER — Encounter (HOSPITAL_COMMUNITY): Payer: Self-pay | Admitting: *Deleted

## 2015-02-17 ENCOUNTER — Encounter (HOSPITAL_COMMUNITY)
Admission: RE | Disposition: A | Discharge status: Home / Self Care | Payer: Self-pay | Source: Ambulatory Visit | Attending: Obstetrics & Gynecology

## 2015-02-17 DIAGNOSIS — Z87891 Personal history of nicotine dependence: Secondary | ICD-10-CM | POA: Insufficient documentation

## 2015-02-17 DIAGNOSIS — Z539 Procedure and treatment not carried out, unspecified reason: Secondary | ICD-10-CM | POA: Insufficient documentation

## 2015-02-17 DIAGNOSIS — N750 Cyst of Bartholin's gland: Secondary | ICD-10-CM | POA: Insufficient documentation

## 2015-02-17 LAB — CBC
HEMATOCRIT: 45.2 % (ref 36.0–46.0)
HEMOGLOBIN: 15.5 g/dL — AB (ref 12.0–15.0)
MCH: 28.9 pg (ref 26.0–34.0)
MCHC: 34.3 g/dL (ref 30.0–36.0)
MCV: 84.3 fL (ref 78.0–100.0)
PLATELETS: 218 10*3/uL (ref 150–400)
RBC: 5.36 MIL/uL — ABNORMAL HIGH (ref 3.87–5.11)
RDW: 14.8 % (ref 11.5–15.5)
WBC: 15.5 10*3/uL — AB (ref 4.0–10.5)

## 2015-02-17 LAB — HCG, QUANTITATIVE, PREGNANCY: hCG, Beta Chain, Quant, S: 230 m[IU]/mL — ABNORMAL HIGH (ref <5)

## 2015-02-17 LAB — PREGNANCY, URINE: Preg Test, Ur: POSITIVE — AB

## 2015-02-17 SURGERY — EXCISION, BARTHOLIN'S GLAND
Anesthesia: Choice

## 2015-02-17 MED ORDER — LIDOCAINE HCL (CARDIAC) 20 MG/ML IV SOLN
INTRAVENOUS | Status: AC
  Filled 2015-02-17: qty 5

## 2015-02-17 MED ORDER — SCOPOLAMINE 1 MG/3DAYS TD PT72
1.0000 | MEDICATED_PATCH | TRANSDERMAL | Status: DC
  Administered 2015-02-17: 1.5 mg via TRANSDERMAL

## 2015-02-17 MED ORDER — FENTANYL CITRATE (PF) 250 MCG/5ML IJ SOLN
INTRAMUSCULAR | Status: AC
  Filled 2015-02-17: qty 5

## 2015-02-17 MED ORDER — MIDAZOLAM HCL 2 MG/2ML IJ SOLN
INTRAMUSCULAR | Status: AC
  Filled 2015-02-17: qty 2

## 2015-02-17 MED ORDER — PROPOFOL 10 MG/ML IV BOLUS
INTRAVENOUS | Status: AC
  Filled 2015-02-17: qty 20

## 2015-02-17 MED ORDER — LACTATED RINGERS IV SOLN
INTRAVENOUS | Status: DC
  Administered 2015-02-17: 08:00:00 via INTRAVENOUS

## 2015-02-17 MED ORDER — DEXAMETHASONE SODIUM PHOSPHATE 4 MG/ML IJ SOLN
INTRAMUSCULAR | Status: AC
  Filled 2015-02-17: qty 1

## 2015-02-17 MED ORDER — LIDOCAINE HCL 2 % IJ SOLN
INTRAMUSCULAR | Status: AC
  Filled 2015-02-17: qty 20

## 2015-02-17 MED ORDER — SCOPOLAMINE 1 MG/3DAYS TD PT72
MEDICATED_PATCH | TRANSDERMAL | Status: AC
  Administered 2015-02-17: 1.5 mg via TRANSDERMAL
  Filled 2015-02-17: qty 1

## 2015-02-17 MED ORDER — ONDANSETRON HCL 4 MG/2ML IJ SOLN
INTRAMUSCULAR | Status: AC
  Filled 2015-02-17: qty 2

## 2015-02-17 SURGICAL SUPPLY — 24 items
BLADE SURG 15 STRL LF C SS BP (BLADE) IMPLANT
BLADE SURG 15 STRL SS (BLADE)
CLOTH BEACON ORANGE TIMEOUT ST (SAFETY) IMPLANT
CONTAINER PREFILL 10% NBF 15ML (MISCELLANEOUS) IMPLANT
COUNTER NEEDLE 1200 MAGNETIC (NEEDLE) IMPLANT
ELECT REM PT RETURN 9FT ADLT (ELECTROSURGICAL)
ELECTRODE REM PT RTRN 9FT ADLT (ELECTROSURGICAL) IMPLANT
GLOVE ECLIPSE 6.0 STRL STRAW (GLOVE) IMPLANT
GOWN STRL REUS W/TWL LRG LVL3 (GOWN DISPOSABLE) IMPLANT
NEEDLE HYPO 22GX1.5 SAFETY (NEEDLE) IMPLANT
NS IRRIG 1000ML POUR BTL (IV SOLUTION) IMPLANT
PACK VAGINAL MINOR WOMEN LF (CUSTOM PROCEDURE TRAY) IMPLANT
PAD OB MATERNITY 4.3X12.25 (PERSONAL CARE ITEMS) IMPLANT
PAD PREP 24X48 CUFFED NSTRL (MISCELLANEOUS) IMPLANT
PENCIL BUTTON HOLSTER BLD 10FT (ELECTRODE) IMPLANT
SUT VIC AB 3-0 CT1 27 (SUTURE)
SUT VIC AB 3-0 CT1 TAPERPNT 27 (SUTURE) IMPLANT
SWAB COLLECTION DEVICE MRSA (MISCELLANEOUS) IMPLANT
TOWEL OR 17X24 6PK STRL BLUE (TOWEL DISPOSABLE) IMPLANT
TUBE ANAEROBIC SPECIMEN COL (MISCELLANEOUS) IMPLANT
TUBING NON-CON 1/4 X 20 CONN (TUBING) IMPLANT
TUBING NON-CON 1/4 X 20' CONN (TUBING)
WATER STERILE IRR 1000ML POUR (IV SOLUTION) IMPLANT
YANKAUER SUCT BULB TIP NO VENT (SUCTIONS) IMPLANT

## 2015-02-17 NOTE — OR Nursing (Signed)
Dr Doretha Sou explained to pt. Reason for surgery cancellation. Home ambulatory. Kristine Royal, RN

## 2015-02-17 NOTE — Anesthesia Preprocedure Evaluation (Addendum)
Anesthesia Evaluation  Patient identified by MRN, date of birth, ID band Patient awake    Reviewed: Allergy & Precautions, H&P , Patient's Chart, lab work & pertinent test results, reviewed documented beta blocker date and time   Airway Mallampati: II  TM Distance: >3 FB Neck ROM: full    Dental no notable dental hx.    Pulmonary former smoker,  breath sounds clear to auscultation  Pulmonary exam normal       Cardiovascular Rhythm:regular Rate:Normal     Neuro/Psych    GI/Hepatic   Endo/Other    Renal/GU      Musculoskeletal   Abdominal   Peds  Hematology   Anesthesia Other Findings   Reproductive/Obstetrics                            Anesthesia Physical Anesthesia Plan  ASA: II  Anesthesia Plan:    Post-op Pain Management:    Induction: Intravenous  Airway Management Planned: LMA  Additional Equipment:   Intra-op Plan:   Post-operative Plan:   Informed Consent: I have reviewed the patients History and Physical, chart, labs and discussed the procedure including the risks, benefits and alternatives for the proposed anesthesia with the patient or authorized representative who has indicated his/her understanding and acceptance.   Dental Advisory Given and Dental advisory given  Plan Discussed with: CRNA and Surgeon  Anesthesia Plan Comments: (Discussed GA with LMA, possible sore throat, potential need to switch to ETT, N/V, pulmonary aspiration. Questions answered. )        Anesthesia Quick Evaluation

## 2015-06-18 ENCOUNTER — Encounter (HOSPITAL_COMMUNITY): Payer: Self-pay

## 2015-06-18 ENCOUNTER — Other Ambulatory Visit (HOSPITAL_COMMUNITY): Payer: Self-pay | Admitting: Obstetrics and Gynecology

## 2015-06-18 ENCOUNTER — Ambulatory Visit (HOSPITAL_COMMUNITY)
Admission: RE | Admit: 2015-06-18 | Discharge: 2015-06-18 | Disposition: A | Discharge status: Home / Self Care | Payer: Managed Care, Other (non HMO) | Source: Ambulatory Visit | Attending: Obstetrics and Gynecology | Admitting: Obstetrics and Gynecology

## 2015-06-18 DIAGNOSIS — O34219 Maternal care for unspecified type scar from previous cesarean delivery: Secondary | ICD-10-CM | POA: Insufficient documentation

## 2015-06-18 DIAGNOSIS — Z3A21 21 weeks gestation of pregnancy: Secondary | ICD-10-CM | POA: Insufficient documentation

## 2015-06-18 DIAGNOSIS — O99280 Endocrine, nutritional and metabolic diseases complicating pregnancy, unspecified trimester: Secondary | ICD-10-CM

## 2015-06-18 DIAGNOSIS — E039 Hypothyroidism, unspecified: Secondary | ICD-10-CM | POA: Diagnosis not present

## 2015-06-18 DIAGNOSIS — O09899 Supervision of other high risk pregnancies, unspecified trimester: Secondary | ICD-10-CM

## 2015-06-18 DIAGNOSIS — O09219 Supervision of pregnancy with history of pre-term labor, unspecified trimester: Secondary | ICD-10-CM | POA: Diagnosis not present

## 2015-06-18 DIAGNOSIS — Z8279 Family history of other congenital malformations, deformations and chromosomal abnormalities: Secondary | ICD-10-CM

## 2015-06-18 DIAGNOSIS — O352XX0 Maternal care for (suspected) hereditary disease in fetus, not applicable or unspecified: Secondary | ICD-10-CM

## 2015-06-18 DIAGNOSIS — Z3689 Encounter for other specified antenatal screening: Secondary | ICD-10-CM

## 2015-06-18 DIAGNOSIS — E079 Disorder of thyroid, unspecified: Secondary | ICD-10-CM

## 2015-06-18 DIAGNOSIS — Z36 Encounter for antenatal screening of mother: Secondary | ICD-10-CM | POA: Insufficient documentation

## 2015-06-18 DIAGNOSIS — O10019 Pre-existing essential hypertension complicating pregnancy, unspecified trimester: Secondary | ICD-10-CM | POA: Diagnosis not present

## 2015-06-18 DIAGNOSIS — O360190 Maternal care for anti-D [Rh] antibodies, unspecified trimester, not applicable or unspecified: Secondary | ICD-10-CM

## 2015-06-18 DIAGNOSIS — O09299 Supervision of pregnancy with other poor reproductive or obstetric history, unspecified trimester: Secondary | ICD-10-CM | POA: Insufficient documentation

## 2015-06-18 DIAGNOSIS — Z315 Encounter for genetic counseling: Secondary | ICD-10-CM | POA: Diagnosis not present

## 2015-06-18 NOTE — Progress Notes (Signed)
Genetic Counseling  Visit Summary Note  Appointment Date: 06/18/2015 Referred By: Vanessa Kick, MD  Date of Birth: 06/14/1986  Pregnancy history: G2P0101 Estimated Date of Delivery: 10/25/15 Estimated Gestational Age: [redacted]w[redacted]d  I met with Mrs. Michele WAMSERand her mother for genetic counseling because of a family history of tetralogy of fallot.  We began by reviewing the family history in detail. [name] a son, now one year old, with tetralogy of fallot.  He was born prematurely due to maternal pre eclampsia, and was diagnosed after delivery with the heart defect.  [name] a maternal half-brother whose son and father both had cleft lip and palate. In addition, [name] has a maternal first cousin who is a twin, has seizures and is possibly on the autism spectrum.   [name] that her son, Michele Rojas has no other physical health concerns and appears to be meeting his milestones appropriately.  Thus, we discussed that he likely has an isolated heart birth defect.  We discussed the multifactorial inheritance of isolated heart birth defects, and the approximate 2.5% recurrence chance for an isolated heart birth defect in a subsequent pregnancy.  We discussed how prenatal imaging by ultrasound and fetal echocardiogram cannot detect all heart defects, but that it is reassuring that no concerns were identified today. Fetal echocardiogram was ordered for further visualization of the cardiac anatomy.  We also discussed her reported family history of cleft lip and palate.  She was counseled that when a cleft lip, with or without a cleft palate, is an isolated finding for a person, it also follows a multifactorial pattern of inheritance.  Given the distance of the relationship, and that there are relatives in her half brother's family, unrelated to [name] who also have cleft lip and palate, the chance for [name] have a child with a cleft lip, with or without a  cleft palate, is not thought to be significantly increased.  Without further information regarding the provided family history of the relative with seizures and possible intellectually disabilities, an accurate genetic risk cannot be calculated. Further genetic counseling can be pursued if more information is obtained. The family histories were otherwise found to be noncontributory for birth defects, mental retardation, and known genetic conditions.   [name] provided with written information regarding cystic fibrosis (CF) including the carrier frequency and incidence in the Caucasian population, the availability of carrier testing and prenatal diagnosis if indicated. In addition, we discussed that CF is routinely screened for as part of the [place] newborn screening panel. She declined testing today.  [name] some exposure to environmental toxins or chemical agents. [name] smoking 1 pack of cigarettes a day during the first few weeks of this unplanned pregnancy. Currently she has cut back significantly, but will occasionally have a cigarette when stressed. We discussed the negative effects smoking can have on pregnancy and she was encouraged to continue cutting back on smoking. She denied the use of alcohol and street drugs. She denied significant viral illnesses during the course of her pregnancy. Her medical and surgical histories were noncontributory to the discussion regarding tetralogy of fallot.   I counseled Mrs. GKlingbeilabout the above risks and available options. The approximate face-to-face time with the genetic counselor was 45 minutes. Most of the counseling was provided by Michele Rojas [employer] genetic counseling student, under my direct supervision.  DCam Hai MS Certified Genetic Counselor

## 2015-06-30 ENCOUNTER — Inpatient Hospital Stay (HOSPITAL_COMMUNITY)
Admission: AD | Admit: 2015-06-30 | Discharge: 2015-06-30 | Disposition: A | Discharge status: Home / Self Care | Payer: Managed Care, Other (non HMO) | Source: Ambulatory Visit | Attending: Obstetrics and Gynecology | Admitting: Obstetrics and Gynecology

## 2015-06-30 ENCOUNTER — Encounter (HOSPITAL_COMMUNITY): Payer: Self-pay | Admitting: *Deleted

## 2015-06-30 DIAGNOSIS — Z3A23 23 weeks gestation of pregnancy: Secondary | ICD-10-CM | POA: Diagnosis not present

## 2015-06-30 DIAGNOSIS — Z880 Allergy status to penicillin: Secondary | ICD-10-CM | POA: Insufficient documentation

## 2015-06-30 DIAGNOSIS — O1492 Unspecified pre-eclampsia, second trimester: Secondary | ICD-10-CM | POA: Diagnosis not present

## 2015-06-30 DIAGNOSIS — Z87891 Personal history of nicotine dependence: Secondary | ICD-10-CM | POA: Insufficient documentation

## 2015-06-30 DIAGNOSIS — R030 Elevated blood-pressure reading, without diagnosis of hypertension: Secondary | ICD-10-CM | POA: Diagnosis present

## 2015-06-30 LAB — PROTEIN / CREATININE RATIO, URINE
Creatinine, Urine: 113 mg/dL
Protein Creatinine Ratio: 0.19 mg/mg{creat} — ABNORMAL HIGH (ref 0.00–0.15)
Total Protein, Urine: 21 mg/dL

## 2015-06-30 LAB — COMPREHENSIVE METABOLIC PANEL
ALBUMIN: 2.6 g/dL — AB (ref 3.5–5.0)
ALK PHOS: 67 U/L (ref 38–126)
ALT: 16 U/L (ref 14–54)
AST: 20 U/L (ref 15–41)
Anion gap: 8 (ref 5–15)
BUN: 6 mg/dL (ref 6–20)
CHLORIDE: 101 mmol/L (ref 101–111)
CO2: 27 mmol/L (ref 22–32)
CREATININE: 0.53 mg/dL (ref 0.44–1.00)
Calcium: 8.8 mg/dL — ABNORMAL LOW (ref 8.9–10.3)
GFR calc Af Amer: >60 mL/min (ref >60)
GFR calc non Af Amer: >60 mL/min (ref >60)
Glucose, Bld: 90 mg/dL (ref 65–99)
Potassium: 2.8 mmol/L — ABNORMAL LOW (ref 3.5–5.1)
SODIUM: 136 mmol/L (ref 135–145)
Total Bilirubin: 0.1 mg/dL — ABNORMAL LOW (ref 0.3–1.2)
Total Protein: 6.3 g/dL — ABNORMAL LOW (ref 6.5–8.1)

## 2015-06-30 LAB — CBC
HCT: 32.5 % — ABNORMAL LOW (ref 36.0–46.0)
HEMOGLOBIN: 11.4 g/dL — AB (ref 12.0–15.0)
MCH: 29.8 pg (ref 26.0–34.0)
MCHC: 35.1 g/dL (ref 30.0–36.0)
MCV: 85.1 fL (ref 78.0–100.0)
PLATELETS: 189 10*3/uL (ref 150–400)
RBC: 3.82 MIL/uL — AB (ref 3.87–5.11)
RDW: 14 % (ref 11.5–15.5)
WBC: 17.7 10*3/uL — AB (ref 4.0–10.5)

## 2015-06-30 LAB — AMYLASE: Amylase: 34 U/L (ref 28–100)

## 2015-06-30 LAB — LACTATE DEHYDROGENASE: LDH: 118 U/L (ref 98–192)

## 2015-06-30 LAB — LIPASE, BLOOD: Lipase: 24 U/L (ref 11–51)

## 2015-06-30 LAB — URIC ACID: Uric Acid, Serum: 2.9 mg/dL (ref 2.3–6.6)

## 2015-06-30 NOTE — Discharge Instructions (Signed)
Hypertension During Pregnancy  Hypertension, or high blood pressure, is when there is extra pressure inside your blood vessels that carry blood from the heart to the rest of your body (arteries). It can happen at any time in life, including pregnancy. Hypertension during pregnancy can cause problems for you and your baby. Your baby might not weigh as much as he or she should at birth or might be born early (premature). Very bad cases of hypertension during pregnancy can be life-threatening.   Different types of hypertension can occur during pregnancy. These include:  · Chronic hypertension. This happens when a woman has hypertension before pregnancy and it continues during pregnancy.  · Gestational hypertension. This is when hypertension develops during pregnancy.  · Preeclampsia or toxemia of pregnancy. This is a very serious type of hypertension that develops only during pregnancy. It affects the whole body and can be very dangerous for both mother and baby.    Gestational hypertension and preeclampsia usually go away after your baby is born. Your blood pressure will likely stabilize within 6 weeks. Women who have hypertension during pregnancy have a greater chance of developing hypertension later in life or with future pregnancies.  RISK FACTORS  There are certain factors that make it more likely for you to develop hypertension during pregnancy. These include:  · Having hypertension before pregnancy.  · Having hypertension during a previous pregnancy.  · Being overweight.  · Being older than 40 years.  · Being pregnant with more than one baby.  · Having diabetes or kidney problems.  SIGNS AND SYMPTOMS  Chronic and gestational hypertension rarely cause symptoms. Preeclampsia has symptoms, which may include:  · Increased protein in your urine. Your health care provider will check for this at every prenatal visit.  · Swelling of your hands and face.  · Rapid weight gain.  · Headaches.  · Visual changes.  · Being  bothered by light.  · Abdominal pain, especially in the upper right area.  · Chest pain.  · Shortness of breath.  · Increased reflexes.  · Seizures. These occur with a more severe form of preeclampsia, called eclampsia.  DIAGNOSIS   You may be diagnosed with hypertension during a regular prenatal exam. At each prenatal visit, you may have:  · Your blood pressure checked.  · A urine test to check for protein in your urine.  The type of hypertension you are diagnosed with depends on when you developed it. It also depends on your specific blood pressure reading.  · Developing hypertension before 20 weeks of pregnancy is consistent with chronic hypertension.  · Developing hypertension after 20 weeks of pregnancy is consistent with gestational hypertension.  · Hypertension with increased urinary protein is diagnosed as preeclampsia.  · Blood pressure measurements that stay above 160 systolic or 110 diastolic are a sign of severe preeclampsia.  TREATMENT  Treatment for hypertension during pregnancy varies. Treatment depends on the type of hypertension and how serious it is.  · If you take medicine for chronic hypertension, you may need to switch medicines.    Medicines called ACE inhibitors should not be taken during pregnancy.    Low-dose aspirin may be suggested for women who have risk factors for preeclampsia.  · If you have gestational hypertension, you may need to take a blood pressure medicine that is safe during pregnancy. Your health care provider will recommend the correct medicine.  · If you have severe preeclampsia, you may need to be in the hospital. Health care   providers will watch you and your baby very closely. You also may need to take medicine called magnesium sulfate to prevent seizures and lower blood pressure.  · Sometimes, an early delivery is needed. This may be the case if the condition worsens. It would be done to protect you and your baby. The only cure for preeclampsia is delivery.  · Your health  care provider may recommend that you take one low-dose aspirin (81 mg) each day to help prevent high blood pressure during your pregnancy if you are at risk for preeclampsia. You may be at risk for preeclampsia if:    You had preeclampsia or eclampsia during a previous pregnancy.    Your baby did not grow as expected during a previous pregnancy.    You experienced preterm birth with a previous pregnancy.    You experienced a separation of the placenta from the uterus (placental abruption) during a previous pregnancy.    You experienced the loss of your baby during a previous pregnancy.    You are pregnant with more than one baby.    You have other medical conditions, such as diabetes or an autoimmune disease.  HOME CARE INSTRUCTIONS  · Schedule and keep all of your regular prenatal care appointments. This is important.  · Take medicines only as directed by your health care provider. Tell your health care provider about all medicines you take.  · Eat as little salt as possible.  · Get regular exercise.  · Do not drink alcohol.  · Do not use tobacco products.  · Do not drink products with caffeine.  · Lie on your left side when resting.  SEEK IMMEDIATE MEDICAL CARE IF:  · You have severe abdominal pain.  · You have sudden swelling in your hands, ankles, or face.  · You gain 4 pounds (1.8 kg) or more in 1 week.  · You vomit repeatedly.  · You have vaginal bleeding.  · You do not feel your baby moving as much.  · You have a headache.  · You have blurred or double vision.  · You have muscle twitching or spasms.  · You have shortness of breath.  · You have blue fingernails or lips.  · You have blood in your urine.  MAKE SURE YOU:  · Understand these instructions.  · Will watch your condition.  · Will get help right away if you are not doing well or get worse.     This information is not intended to replace advice given to you by your health care provider. Make sure you discuss any questions you have with your health care  provider.     Document Released: 04/13/2011 Document Revised: 08/16/2014 Document Reviewed: 02/22/2013  Elsevier Interactive Patient Education ©2016 [employer]  Preeclampsia and Eclampsia  Preeclampsia is a serious condition that develops only during pregnancy. It is also called toxemia of pregnancy. This condition causes high blood pressure along with other symptoms, such as swelling and headaches. These may develop as the condition gets worse. Preeclampsia may occur 20 weeks or later into your pregnancy.   Diagnosing and treating preeclampsia early is very important. If not treated early, it can cause serious problems for you and your baby. One problem it can lead to is eclampsia, which is a condition that causes muscle jerking or shaking (convulsions) in the mother. Delivering your baby is the best treatment for preeclampsia or eclampsia.   RISK FACTORS  The cause of preeclampsia is not known. You may   be more likely to develop preeclampsia if you have certain risk factors. These include:   · Being pregnant for the first time.  · Having preeclampsia in a past pregnancy.  · Having a family history of preeclampsia.  · Having high blood pressure.  · Being pregnant with twins or triplets.  · Being 35 or older.  · Being African American.  · Having kidney disease or diabetes.  · Having medical conditions such as lupus or blood diseases.  · Being very overweight (obese).  SIGNS AND SYMPTOMS   The earliest signs of preeclampsia are:  · High blood pressure.  · Increased protein in your urine. Your health care provider will check for this at every prenatal visit.  Other symptoms that can develop include:   · Severe headaches.  · Sudden weight gain.  · Swelling of your hands, face, legs, and feet.  · Feeling sick to your stomach (nauseous) and throwing up (vomiting).  · Vision problems (blurred or double vision).  · Numbness in your face, arms, legs, and feet.  · Dizziness.  · Slurred speech.  · Sensitivity to bright  lights.  · Abdominal pain.  DIAGNOSIS   There are no screening tests for preeclampsia. Your health care provider will ask you about symptoms and check for signs of preeclampsia during your prenatal visits. You may also have tests, including:  · Urine testing.  · Blood testing.  · Checking your baby's heart rate.  · Checking the health of your baby and your placenta using images created with sound waves (ultrasound).  TREATMENT   You can work out the best treatment approach together with your health care provider. It is very important to keep all prenatal appointments. If you have an increased risk of preeclampsia, you may need more frequent prenatal exams.  · Your health care provider may prescribe bed rest.  · You may have to eat as little salt as possible.  · You may need to take medicine to lower your blood pressure if the condition does not respond to more conservative measures.  · You may need to stay in the hospital if your condition is severe. There, treatment will focus on controlling your blood pressure and fluid retention. You may also need to take medicine to prevent seizures.  · If the condition gets worse, your baby may need to be delivered early to protect you and the baby. You may have your labor started with medicine (be induced), or you may have a cesarean delivery.  · Preeclampsia usually goes away after the baby is born.  HOME CARE INSTRUCTIONS   · Only take over-the-counter or prescription medicines as directed by your health care provider.  · Lie on your left side while resting. This keeps pressure off your baby.  · Elevate your feet while resting.  · Get regular exercise. Ask your health care provider what type of exercise is safe for you.  · Avoid caffeine and alcohol.  · Do not smoke.  · Drink 6-8 glasses of water every day.  · Eat a balanced diet that is low in salt. Do not add salt to your food.  · Avoid stressful situations as much as possible.  · Get plenty of rest and sleep.  · Keep all  prenatal appointments and tests as scheduled.  SEEK MEDICAL CARE IF:  · You are gaining more weight than expected.  · You have any headaches, abdominal pain, or nausea.  · You are bruising more than usual.  ·   You feel dizzy or light-headed.  SEEK IMMEDIATE MEDICAL CARE IF:   · You develop sudden or severe swelling anywhere in your body. This usually happens in the legs.  · You gain 5 lb (2.3 kg) or more in a week.  · You have a severe headache, dizziness, problems with your vision, or confusion.  · You have severe abdominal pain.  · You have lasting nausea or vomiting.  · You have a seizure.  · You have trouble moving any part of your body.  · You develop numbness in your body.  · You have trouble speaking.  · You have any abnormal bleeding.  · You develop a stiff neck.  · You pass out.  MAKE SURE YOU:   · Understand these instructions.  · Will watch your condition.  · Will get help right away if you are not doing well or get worse.     This information is not intended to replace advice given to you by your health care provider. Make sure you discuss any questions you have with your health care provider.     Document Released: 07/23/2000 Document Revised: 07/31/2013 Document Reviewed: 05/18/2013  Elsevier Interactive Patient Education ©2016 [employer]

## 2015-06-30 NOTE — MAU Note (Signed)
Urine in lab 

## 2015-06-30 NOTE — MAU Provider Note (Signed)
Chief Complaint:  Hypertension and Leg Swelling   None     HPI: Michele Rojas is a 29 y.o. G2P0101 at 91w2dwho presents to maternity admissions reporting elevated BP at home in last 2 days, RUQ pain x 2 days, swelling of her lower legs, and some episodes of spots in her vision in last week.  BP 140s-150s/80s-90s at home.  RUQ pain is dull, under her ribs on right side and constant. It is not relieved by position change or heating pad.  She has not taken any medication.  She has hx of preeclampsia and was delivered at 31 weeks with her previous pregnancy.  She is not taking any medications for hypertension at this time.  She reports good fetal movement, denies LOF, vaginal bleeding, vaginal itching/burning, urinary symptoms, h/a, dizziness, n/v, or fever/chills.    HPI  Past Medical History: Past Medical History  Diagnosis Date  . Tobacco abuse   . Fatigue   . Constipation   . History of chicken pox as a child  . Headache   . Depression     Past obstetric history: OB History  Gravida Para Term Preterm AB SAB TAB Ectopic Multiple Living  2 1  1      1     # Outcome Date GA Lbr Len/2nd Weight Sex Delivery Anes PTL Lv  2 Current           1 Preterm 06/09/14 [redacted]w[redacted]d  3 lb 5.6 oz (1.52 kg) M CS-LTranv Spinal  Y      Past Surgical History: Past Surgical History  Procedure Laterality Date  . Cholecystectomy, laparoscopic    . Wisdom tooth extraction    . Achilles tendon surgery    . Bartholin gland cyst excision    . Cesarean section N/A 06/09/2014    Procedure: CESAREAN SECTION;  Surgeon: Farrel Gobble. Harrington Challenger, MD;  Location: [facility];  Service: Obstetrics;  Laterality: N/A;    Family History: Family History  Problem Relation Age of Onset  . Depression Mother     after a car accident  . Hypertension Father   . Hyperlipidemia Father   . Aneurysm Maternal Grandfather   . Alzheimer's disease Other   . Depression Brother     major depression    Social History: Social History   Substance Use Topics  . Smoking status: Former Smoker -- 0.75 packs/day    Types: Cigarettes    Quit date: 05/09/2010  . Smokeless tobacco: Never Used  . Alcohol Use: No     Comment: socially    Allergies:  Allergies  Allergen Reactions  . Amoxicillin Other (See Comments)    REACTION: yeast infection in mouth Has patient had a PCN reaction causing immediate rash, facial/tongue/throat swelling, SOB or lightheadedness with hypotension: No Has patient had a PCN reaction causing severe rash involving mucus membranes or skin necrosis: No Has patient had a PCN reaction that required hospitalization no Has patient had a PCN reaction occurring within the last 10 years: Yes If all of the above answers are "NO", then may proceed with Cephalosporin use.  Marland Kitchen Hydrocodone Itching    Meds:  Prescriptions prior to admission  Medication Sig Dispense Refill Last Dose  . calcium carbonate (TUMS - DOSED IN MG ELEMENTAL CALCIUM) 500 MG chewable tablet Chew 3 tablets by mouth 2 (two) times daily as needed for indigestion or heartburn.   06/30/2015 at Unknown time  . FLUoxetine (PROZAC) 10 MG capsule Take 10 mg by mouth daily.  06/30/2015 at Unknown time  . Prenatal Vit-Fe Fumarate-FA (PRENATAL MULTIVITAMIN) TABS tablet Take 1 tablet by mouth daily at 12 noon.   06/29/2015 at Unknown time  . RaNITidine HCl (ZANTAC PO) Take 1 tablet by mouth daily as needed (indigestion).    06/30/2015 at Unknown time    ROS:  Review of Systems  Constitutional: Negative for fever, chills and fatigue.  HENT: Negative for sinus pressure.   Eyes: Negative for photophobia.  Respiratory: Negative for shortness of breath.   Cardiovascular: Negative for chest pain.  Gastrointestinal: Negative for nausea, vomiting, diarrhea and constipation.  Genitourinary: Negative for dysuria, frequency, flank pain, vaginal bleeding, vaginal discharge, difficulty urinating, vaginal pain and pelvic pain.  Musculoskeletal: Negative for  neck pain.  Neurological: Negative for dizziness, weakness and headaches.  Psychiatric/Behavioral: Negative.      I have reviewed patient's Past Medical Hx, Surgical Hx, Family Hx, Social Hx, medications and allergies.   Physical Exam   Patient Vitals for the past 24 hrs:  BP Pulse  06/30/15 1500 125/76 mmHg 79  06/30/15 1445 125/72 mmHg 77  06/30/15 1430 124/77 mmHg 76  06/30/15 1417 130/79 mmHg 82  06/30/15 1412 141/77 mmHg 85   Constitutional: Well-developed, well-nourished female in no acute distress.  Cardiovascular: normal rate Respiratory: normal effort GI: Abd soft, non-tender, gravid appropriate for gestational age.  MS: Extremities nontender, no edema, normal ROM Neurologic: Alert and oriented x 4.  GU: Neg CVAT.     FHT:  Baseline 145, moderate variability, accelerations present, no decelerations Contractions: None on toco or to palpation   Labs: Results for orders placed or performed during the hospital encounter of 06/30/15 (from the past 24 hour(s))  Protein / creatinine ratio, urine     Status: Abnormal   Collection Time: 06/30/15  2:00 PM  Result Value Ref Range   Creatinine, Urine 113.00 mg/dL   Total Protein, Urine 21 mg/dL   Protein Creatinine Ratio 0.19 (H) 0.00 - 0.15 mg/mg[Cre]  CBC     Status: Abnormal   Collection Time: 06/30/15  3:01 PM  Result Value Ref Range   WBC 17.7 (H) 4.0 - 10.5 K/uL   RBC 3.82 (L) 3.87 - 5.11 MIL/uL   Hemoglobin 11.4 (L) 12.0 - 15.0 g/dL   HCT 32.5 (L) 36.0 - 46.0 %   MCV 85.1 78.0 - 100.0 fL   MCH 29.8 26.0 - 34.0 pg   MCHC 35.1 30.0 - 36.0 g/dL   RDW 14.0 11.5 - 15.5 %   Platelets 189 150 - 400 K/uL  Comprehensive metabolic panel     Status: Abnormal   Collection Time: 06/30/15  3:01 PM  Result Value Ref Range   Sodium 136 135 - 145 mmol/L   Potassium 2.8 (L) 3.5 - 5.1 mmol/L   Chloride 101 101 - 111 mmol/L   CO2 27 22 - 32 mmol/L   Glucose, Bld 90 65 - 99 mg/dL   BUN 6 6 - 20 mg/dL   Creatinine, Ser 0.53  0.44 - 1.00 mg/dL   Calcium 8.8 (L) 8.9 - 10.3 mg/dL   Total Protein 6.3 (L) 6.5 - 8.1 g/dL   Albumin 2.6 (L) 3.5 - 5.0 g/dL   AST 20 15 - 41 U/L   ALT 16 14 - 54 U/L   Alkaline Phosphatase 67 38 - 126 U/L   Total Bilirubin 0.1 (L) 0.3 - 1.2 mg/dL   GFR calc non Af Amer >60 >60 mL/min   GFR calc Af Amer >60 >60  mL/min   Anion gap 8 5 - 15  Uric acid     Status: None   Collection Time: 06/30/15  3:01 PM  Result Value Ref Range   Uric Acid, Serum 2.9 2.3 - 6.6 mg/dL  Lactate dehydrogenase     Status: None   Collection Time: 06/30/15  3:01 PM  Result Value Ref Range   LDH 118 98 - 192 U/L  Lipase, blood     Status: None   Collection Time: 06/30/15  3:01 PM  Result Value Ref Range   Lipase 24 11 - 51 U/L  Amylase     Status: None   Collection Time: 06/30/15  3:01 PM  Result Value Ref Range   Amylase 34 28 - 100 U/L      MAU Course/MDM: I have ordered labs and reviewed results.  Reviewed FHR tracing.  Consult Dr Rogue Bussing to review labs/assessment/FHR tracing.  Added Amylase/lipase to labs.  Reviewed results.  With normal preeclampsia labs and normal amylase/lipase, pt stable for discharge. Reassurance provided. Pt to continue plan to see MFM weekly and have testing in office for preeclampsia. Precautions given and reasons to return to hospital reviewed.  Pt stable at time of discharge.  Assessment: 1. Pre-eclampsia, second trimester     Plan: Discharge home with preeclampsia precautions Preterm labor precautions and fetal kick counts      Follow-up Information    Follow up with CALLAHAN, SIDNEY, DO.   Specialty:  Obstetrics and Gynecology   Why:  As scheduled , Return to MAU as needed for emergencies   Contact information:   [place] [address] [phone]        Medication List    TAKE these medications        calcium carbonate 500 MG chewable tablet  Commonly known as:  TUMS - dosed in mg elemental calcium  Chew 3 tablets by  mouth 2 (two) times daily as needed for indigestion or heartburn.     FLUoxetine 10 MG capsule  Commonly known as:  PROZAC  Take 10 mg by mouth daily.     prenatal multivitamin Tabs tablet  Take 1 tablet by mouth daily at 12 noon.     ZANTAC PO  Take 1 tablet by mouth daily as needed (indigestion).        Fatima Blank Certified Nurse-Midwife 06/30/2015 5:13 PM

## 2015-06-30 NOTE — MAU Note (Signed)
States BP has been up, protein in urine. Is retaining fluids, some discomfort in RUQ, feels like a HA is coming on.  Called office and was told to come in

## 2015-07-04 ENCOUNTER — Other Ambulatory Visit (HOSPITAL_COMMUNITY): Payer: Self-pay

## 2015-07-08 ENCOUNTER — Encounter (HOSPITAL_COMMUNITY): Payer: Self-pay

## 2015-07-08 ENCOUNTER — Ambulatory Visit (HOSPITAL_COMMUNITY)
Admission: RE | Admit: 2015-07-08 | Discharge: 2015-07-08 | Disposition: A | Discharge status: Home / Self Care | Payer: Managed Care, Other (non HMO) | Source: Ambulatory Visit | Attending: Obstetrics and Gynecology | Admitting: Obstetrics and Gynecology

## 2015-07-08 DIAGNOSIS — Z87891 Personal history of nicotine dependence: Secondary | ICD-10-CM | POA: Diagnosis not present

## 2015-07-08 DIAGNOSIS — O132 Gestational [pregnancy-induced] hypertension without significant proteinuria, second trimester: Secondary | ICD-10-CM | POA: Diagnosis present

## 2015-07-08 DIAGNOSIS — Z3A24 24 weeks gestation of pregnancy: Secondary | ICD-10-CM | POA: Insufficient documentation

## 2015-07-08 NOTE — Progress Notes (Signed)
MATERNAL FETAL MEDICINE CONSULT  Patient Name: Michele Rojas Record Number:  [id number] Date of Birth: [birth year] Requesting Physician Name:  Allyn Kenner, DO Date of Service: 07/08/2015  Chief Complaint History of early onset preeclampsia  History of Present [place] was seen today secondary to history of early onset preeclampsia at the request of Allyn Kenner, DO.  The patient is a 29 y.o. G2P0101,at [redacted]w[redacted]d with an EDD of 10/25/2015, by Other Basis dating method.  She developed preeclampsia at approximately 26 week in her last pregnancy.  She remained hospitalized until delivery via LTCS at 31 weeks.  She remained hypertensive requiring medication for approximately 2 months after delivery.  However, she remained normotensive thereafter.  She reports intermittent blood pressure elevations both at home and at her OBs office in the 140-150's/90's.  At this time she denies headache, visual changes, swelling, or RUQ pain.  However, she was seen in the MAU approximately 1 week ago for elevated BPs and RUQ pain.    She also incidentally reported that she is experiencing significant pain in the area of her pubic symphysis particularly when walking.  Review of Systems Pertinent items are noted in HPI.  Patient History OB History  Gravida Para Term Preterm AB SAB TAB Ectopic Multiple Living  2 1  1      1     # Outcome Date GA Lbr Len/2nd Weight Sex Delivery Anes PTL Lv  2 Current           1 Preterm 06/09/14 [redacted]w[redacted]d  3 lb 5.6 oz (1.52 kg) M CS-LTranv Spinal  Y      Past Medical History  Diagnosis Date  . Tobacco abuse   . Fatigue   . Constipation   . History of chicken pox as a child  . Headache   . Depression     Past Surgical History  Procedure Laterality Date  . Cholecystectomy, laparoscopic    . Wisdom tooth extraction    . Achilles tendon surgery    . Bartholin gland cyst excision    . Cesarean section N/A 06/09/2014    Procedure: CESAREAN SECTION;   Surgeon: Farrel Gobble. Harrington Challenger, MD;  Location: [facility];  Service: Obstetrics;  Laterality: N/A;    Social History   Social History  . Marital Status: Married    Spouse Name: N/A  . Number of Children: N/A  . Years of Education: N/A   Social History Main Topics  . Smoking status: Former Smoker -- 0.75 packs/day    Types: Cigarettes    Quit date: 05/09/2010  . Smokeless tobacco: Never Used  . Alcohol Use: No     Comment: socially  . Drug Use: No  . Sexual Activity: Yes   Other Topics Concern  . None   Social History Narrative   GYN- Dr. 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 Depression Mother     after a car accident  . Hypertension Father   . Hyperlipidemia Father   . Aneurysm Maternal Grandfather   . Alzheimer's disease Other   . Depression Brother     major depression   In addition, the patient has no family history of mental retardation, birth defects, or genetic diseases.  Physical Examination Filed Vitals:   07/08/15 1121  BP: 138/82  Pulse: 95   General appearance - alert, well appearing, and in no distress  Assessment and Recommendations 1.  History of early onset preeclampsia.  Although Ms.  Amoah has had several mild elevations of her blood pressure a 24 hour urin collection performed approximately one week at was normal.  Thus, Ms. Mckeag currently has gestational hypertension as she does not have sufficient proteinuria to meet criteria for preeclampsia.  However, given her history it is possible the hypertension will develop into preeclampsia in the future.  Thus, I recommend she has a prenatal visit approximately every 2 weeks to monitor blood pressures.  When she reaches 28 weeks I would perform once weekly BPP's given the increased risk of IUFD in women with gestational hypertension.  If her BP becomes  persistently elevated to 160/110 or greater she should be hospitalized and treated as though she has preeclampsia with severe features, i.e. oral antihypertensives and delivery at 34 weeks unless indicated earlier.  If she continues to have just gestational hypertension or develops preeclampsia without severe features she should be delivered at approximately 37 weeks.  Although it is likely to be less effective being started so late in pregnancy, Ms. Rannow may benefit from a daily baby aspirin given her history of early onset preeclampsia.  2.  Pubic symphysis pain.  Ms. Baumhover described classic symptoms of stretching of the pubic symphysis which often occurs during pregnancy.  There is very little that can be done for this.  However, if it becomes severe enough an Orthopedics consult could be considered for fitting of a pelvic brace.  I spent 30 minutes with Ms. Gobrecht today of which 50% was face-to-face counseling.  Thank you for referring Ms. Zima to the [facility]  Please do not hesitate to contact us with questions.   Jolyn Lent, MD

## 2015-07-17 ENCOUNTER — Encounter (HOSPITAL_COMMUNITY): Payer: Self-pay

## 2015-07-17 ENCOUNTER — Other Ambulatory Visit (HOSPITAL_COMMUNITY): Payer: Self-pay

## 2015-07-17 ENCOUNTER — Other Ambulatory Visit (HOSPITAL_COMMUNITY): Payer: Self-pay | Admitting: Obstetrics

## 2015-09-22 ENCOUNTER — Encounter (HOSPITAL_COMMUNITY)
Admission: AD | Disposition: A | Discharge status: Home / Self Care | Payer: Self-pay | Source: Ambulatory Visit | Attending: Obstetrics

## 2015-09-22 ENCOUNTER — Encounter (HOSPITAL_COMMUNITY): Payer: Self-pay

## 2015-09-22 ENCOUNTER — Inpatient Hospital Stay (HOSPITAL_COMMUNITY)
Admission: AD | Admit: 2015-09-22 | Discharge: 2015-09-25 | DRG: 765 | Disposition: A | Discharge status: Home / Self Care | Payer: Managed Care, Other (non HMO) | Source: Ambulatory Visit | Attending: Obstetrics | Admitting: Obstetrics

## 2015-09-22 ENCOUNTER — Inpatient Hospital Stay (HOSPITAL_COMMUNITY): Payer: Managed Care, Other (non HMO) | Admitting: Anesthesiology

## 2015-09-22 DIAGNOSIS — Z818 Family history of other mental and behavioral disorders: Secondary | ICD-10-CM | POA: Diagnosis not present

## 2015-09-22 DIAGNOSIS — O4593 Premature separation of placenta, unspecified, third trimester: Principal | ICD-10-CM | POA: Diagnosis present

## 2015-09-22 DIAGNOSIS — Z820 Family history of epilepsy and other diseases of the nervous system: Secondary | ICD-10-CM

## 2015-09-22 DIAGNOSIS — O134 Gestational [pregnancy-induced] hypertension without significant proteinuria, complicating childbirth: Secondary | ICD-10-CM | POA: Diagnosis present

## 2015-09-22 DIAGNOSIS — O4590 Premature separation of placenta, unspecified, unspecified trimester: Secondary | ICD-10-CM | POA: Diagnosis present

## 2015-09-22 DIAGNOSIS — Z3A35 35 weeks gestation of pregnancy: Secondary | ICD-10-CM | POA: Diagnosis not present

## 2015-09-22 DIAGNOSIS — Z8249 Family history of ischemic heart disease and other diseases of the circulatory system: Secondary | ICD-10-CM | POA: Diagnosis not present

## 2015-09-22 DIAGNOSIS — Z6791 Unspecified blood type, Rh negative: Secondary | ICD-10-CM | POA: Diagnosis not present

## 2015-09-22 DIAGNOSIS — Z87891 Personal history of nicotine dependence: Secondary | ICD-10-CM | POA: Diagnosis not present

## 2015-09-22 DIAGNOSIS — O9962 Diseases of the digestive system complicating childbirth: Secondary | ICD-10-CM | POA: Diagnosis present

## 2015-09-22 DIAGNOSIS — O26893 Other specified pregnancy related conditions, third trimester: Secondary | ICD-10-CM | POA: Diagnosis present

## 2015-09-22 DIAGNOSIS — K219 Gastro-esophageal reflux disease without esophagitis: Secondary | ICD-10-CM | POA: Diagnosis present

## 2015-09-22 DIAGNOSIS — O34211 Maternal care for low transverse scar from previous cesarean delivery: Secondary | ICD-10-CM | POA: Diagnosis present

## 2015-09-22 DIAGNOSIS — O4693 Antepartum hemorrhage, unspecified, third trimester: Secondary | ICD-10-CM | POA: Diagnosis present

## 2015-09-22 HISTORY — DX: Unspecified pre-eclampsia, unspecified trimester: O14.90

## 2015-09-22 HISTORY — DX: Papillomavirus as the cause of diseases classified elsewhere: B97.7

## 2015-09-22 HISTORY — DX: Hypothyroidism, unspecified: E03.9

## 2015-09-22 HISTORY — DX: Nicotine dependence, unspecified, uncomplicated: F17.200

## 2015-09-22 HISTORY — DX: Personal history of pre-term labor: Z87.51

## 2015-09-22 HISTORY — DX: Other specified behavioral and emotional disorders with onset usually occurring in childhood and adolescence: F98.8

## 2015-09-22 HISTORY — DX: Personal history of other diseases of the female genital tract: Z87.42

## 2015-09-22 HISTORY — DX: Vitamin deficiency, unspecified: E56.9

## 2015-09-22 HISTORY — DX: Emotional lability: R45.86

## 2015-09-22 LAB — PREPARE RBC (CROSSMATCH)

## 2015-09-22 LAB — CBC WITH DIFFERENTIAL/PLATELET
BASOS ABS: 0 10*3/uL (ref 0.0–0.1)
Basophils Relative: 0 %
EOS PCT: 1 %
Eosinophils Absolute: 0.1 10*3/uL (ref 0.0–0.7)
HEMATOCRIT: 31.7 % — AB (ref 36.0–46.0)
Hemoglobin: 10.9 g/dL — ABNORMAL LOW (ref 12.0–15.0)
LYMPHS PCT: 21 %
Lymphs Abs: 3.9 10*3/uL (ref 0.7–4.0)
MCH: 29.6 pg (ref 26.0–34.0)
MCHC: 34.4 g/dL (ref 30.0–36.0)
MCV: 86.1 fL (ref 78.0–100.0)
Monocytes Absolute: 0.7 10*3/uL (ref 0.1–1.0)
Monocytes Relative: 4 %
NEUTROS ABS: 13.6 10*3/uL — AB (ref 1.7–7.7)
Neutrophils Relative %: 74 %
PLATELETS: 199 10*3/uL (ref 150–400)
RBC: 3.68 MIL/uL — ABNORMAL LOW (ref 3.87–5.11)
RDW: 13.9 % (ref 11.5–15.5)
WBC: 18.3 10*3/uL — ABNORMAL HIGH (ref 4.0–10.5)

## 2015-09-22 LAB — COMPREHENSIVE METABOLIC PANEL
ALT: 24 U/L (ref 14–54)
AST: 28 U/L (ref 15–41)
Albumin: 2.4 g/dL — ABNORMAL LOW (ref 3.5–5.0)
Alkaline Phosphatase: 103 U/L (ref 38–126)
Anion gap: 10 (ref 5–15)
BUN: 6 mg/dL (ref 6–20)
CHLORIDE: 105 mmol/L (ref 101–111)
CO2: 22 mmol/L (ref 22–32)
CREATININE: 0.47 mg/dL (ref 0.44–1.00)
Calcium: 8.5 mg/dL — ABNORMAL LOW (ref 8.9–10.3)
GFR calc Af Amer: >60 mL/min (ref >60)
GLUCOSE: 79 mg/dL (ref 65–99)
Potassium: 3.3 mmol/L — ABNORMAL LOW (ref 3.5–5.1)
Sodium: 137 mmol/L (ref 135–145)
Total Bilirubin: 0.3 mg/dL (ref 0.3–1.2)
Total Protein: 6.6 g/dL (ref 6.5–8.1)

## 2015-09-22 SURGERY — Surgical Case
Anesthesia: Spinal

## 2015-09-22 MED ORDER — FENTANYL CITRATE (PF) 100 MCG/2ML IJ SOLN
INTRAMUSCULAR | Status: DC | PRN
  Administered 2015-09-22: 10 ug via INTRATHECAL

## 2015-09-22 MED ORDER — NALBUPHINE HCL 10 MG/ML IJ SOLN: 5.0000 mg | INTRAMUSCULAR | Status: DC | PRN

## 2015-09-22 MED ORDER — OXYTOCIN 10 UNIT/ML IJ SOLN
40.0000 [IU] | INTRAMUSCULAR | Status: DC | PRN
  Administered 2015-09-22: 40 [IU] via INTRAVENOUS

## 2015-09-22 MED ORDER — NALBUPHINE HCL 10 MG/ML IJ SOLN: 5.0000 mg | Freq: Once | INTRAMUSCULAR | Status: DC | PRN

## 2015-09-22 MED ORDER — LACTATED RINGERS IV SOLN
INTRAVENOUS | Status: DC | PRN
  Administered 2015-09-22 (×3): via INTRAVENOUS

## 2015-09-22 MED ORDER — ACETAMINOPHEN 500 MG PO TABS
1000.0000 mg | ORAL_TABLET | Freq: Four times a day (QID) | ORAL | Status: AC
  Administered 2015-09-22 – 2015-09-23 (×3): 1000 mg via ORAL
  Filled 2015-09-22 (×4): qty 2

## 2015-09-22 MED ORDER — ONDANSETRON HCL 4 MG/2ML IJ SOLN
INTRAMUSCULAR | Status: AC
  Filled 2015-09-22: qty 2

## 2015-09-22 MED ORDER — ONDANSETRON HCL 4 MG/2ML IJ SOLN
INTRAMUSCULAR | Status: DC | PRN
  Administered 2015-09-22: 4 mg via INTRAVENOUS

## 2015-09-22 MED ORDER — SCOPOLAMINE 1 MG/3DAYS TD PT72
MEDICATED_PATCH | TRANSDERMAL | Status: DC | PRN
  Administered 2015-09-22: 1 via TRANSDERMAL

## 2015-09-22 MED ORDER — NALBUPHINE HCL 10 MG/ML IJ SOLN
5.0000 mg | INTRAMUSCULAR | Status: DC | PRN
  Administered 2015-09-23 (×2): 5 mg via INTRAVENOUS
  Filled 2015-09-22 (×2): qty 1

## 2015-09-22 MED ORDER — PHENYLEPHRINE 8 MG IN D5W 100 ML (0.08MG/ML) PREMIX OPTIME
INJECTION | INTRAVENOUS | Status: DC | PRN
  Administered 2015-09-22: 40 ug/min via INTRAVENOUS

## 2015-09-22 MED ORDER — FENTANYL CITRATE (PF) 250 MCG/5ML IJ SOLN
INTRAMUSCULAR | Status: AC
  Filled 2015-09-22: qty 5

## 2015-09-22 MED ORDER — DEXAMETHASONE SODIUM PHOSPHATE 4 MG/ML IJ SOLN
INTRAMUSCULAR | Status: AC
  Filled 2015-09-22: qty 1

## 2015-09-22 MED ORDER — CEFAZOLIN SODIUM-DEXTROSE 2-3 GM-% IV SOLR
INTRAVENOUS | Status: AC
  Filled 2015-09-22: qty 50

## 2015-09-22 MED ORDER — MORPHINE SULFATE (PF) 0.5 MG/ML IJ SOLN
INTRAMUSCULAR | Status: AC
  Filled 2015-09-22: qty 10

## 2015-09-22 MED ORDER — CITRIC ACID-SODIUM CITRATE 334-500 MG/5ML PO SOLN
30.0000 mL | Freq: Once | ORAL | Status: AC
  Administered 2015-09-22: 30 mL via ORAL
  Filled 2015-09-22: qty 15

## 2015-09-22 MED ORDER — LACTATED RINGERS IV BOLUS (SEPSIS)
1000.0000 mL | Freq: Once | INTRAVENOUS | Status: AC
  Administered 2015-09-22: 1000 mL via INTRAVENOUS

## 2015-09-22 MED ORDER — BUPIVACAINE IN DEXTROSE 0.75-8.25 % IT SOLN
INTRATHECAL | Status: DC | PRN
  Administered 2015-09-22: 2 mL via INTRATHECAL

## 2015-09-22 MED ORDER — DEXAMETHASONE SODIUM PHOSPHATE 4 MG/ML IJ SOLN
INTRAMUSCULAR | Status: DC | PRN
  Administered 2015-09-22: 4 mg via INTRAVENOUS

## 2015-09-22 MED ORDER — LACTATED RINGERS IV SOLN
INTRAVENOUS | Status: DC | PRN
  Administered 2015-09-22: 21:00:00 via INTRAVENOUS

## 2015-09-22 MED ORDER — MEPERIDINE HCL 25 MG/ML IJ SOLN: 6.2500 mg | INTRAMUSCULAR | Status: DC | PRN

## 2015-09-22 MED ORDER — MORPHINE SULFATE (PF) 0.5 MG/ML IJ SOLN
INTRAMUSCULAR | Status: DC | PRN
  Administered 2015-09-22: .2 mg via INTRATHECAL

## 2015-09-22 MED ORDER — OXYTOCIN 10 UNIT/ML IJ SOLN
INTRAMUSCULAR | Status: AC
  Filled 2015-09-22: qty 4

## 2015-09-22 MED ORDER — SCOPOLAMINE 1 MG/3DAYS TD PT72
MEDICATED_PATCH | TRANSDERMAL | Status: AC
  Filled 2015-09-22: qty 1

## 2015-09-22 MED ORDER — CEFAZOLIN SODIUM-DEXTROSE 2-3 GM-% IV SOLR
2.0000 g | INTRAVENOUS | Status: AC
  Administered 2015-09-22: 2 g via INTRAVENOUS

## 2015-09-22 MED ORDER — PROMETHAZINE HCL 25 MG/ML IJ SOLN: 6.2500 mg | INTRAMUSCULAR | Status: DC | PRN

## 2015-09-22 MED ORDER — FAMOTIDINE IN NACL 20-0.9 MG/50ML-% IV SOLN
20.0000 mg | Freq: Once | INTRAVENOUS | Status: AC
  Administered 2015-09-22 (×2): 20 mg via INTRAVENOUS
  Filled 2015-09-22: qty 50

## 2015-09-22 MED ORDER — SODIUM CHLORIDE 0.9 % IV SOLN: Freq: Once | INTRAVENOUS | Status: DC

## 2015-09-22 MED ORDER — PHENYLEPHRINE 8 MG IN D5W 100 ML (0.08MG/ML) PREMIX OPTIME
INJECTION | INTRAVENOUS | Status: AC
  Filled 2015-09-22: qty 100

## 2015-09-22 SURGICAL SUPPLY — 38 items
APL SKNCLS STERI-STRIP NONHPOA (GAUZE/BANDAGES/DRESSINGS) ×1
BENZOIN TINCTURE PRP APPL 2/3 (GAUZE/BANDAGES/DRESSINGS) ×3 IMPLANT
CLAMP CORD UMBIL (MISCELLANEOUS) IMPLANT
CLOSURE STERI-STRIP 1/2X4 (GAUZE/BANDAGES/DRESSINGS) ×1
CLOSURE WOUND 1/2 X4 (GAUZE/BANDAGES/DRESSINGS) ×1
CLOTH BEACON ORANGE TIMEOUT ST (SAFETY) ×3 IMPLANT
CLSR STERI-STRIP ANTIMIC 1/2X4 (GAUZE/BANDAGES/DRESSINGS) ×2 IMPLANT
DRSG OPSITE POSTOP 4X10 (GAUZE/BANDAGES/DRESSINGS) ×3 IMPLANT
DURAPREP 26ML APPLICATOR (WOUND CARE) ×3 IMPLANT
ELECT REM PT RETURN 9FT ADLT (ELECTROSURGICAL) ×3
ELECTRODE REM PT RTRN 9FT ADLT (ELECTROSURGICAL) ×1 IMPLANT
EXTRACTOR VACUUM KIWI (MISCELLANEOUS) IMPLANT
GLOVE BIO SURGEON STRL SZ 6 (GLOVE) ×3 IMPLANT
GLOVE BIOGEL PI IND STRL 6.5 (GLOVE) ×1 IMPLANT
GLOVE BIOGEL PI IND STRL 7.0 (GLOVE) ×1 IMPLANT
GLOVE BIOGEL PI INDICATOR 6.5 (GLOVE) ×2
GLOVE BIOGEL PI INDICATOR 7.0 (GLOVE) ×2
GOWN STRL REUS W/TWL LRG LVL3 (GOWN DISPOSABLE) ×6 IMPLANT
KIT ABG SYR 3ML LUER SLIP (SYRINGE) IMPLANT
NEEDLE HYPO 25X5/8 SAFETYGLIDE (NEEDLE) IMPLANT
NS IRRIG 1000ML POUR BTL (IV SOLUTION) ×3 IMPLANT
PACK C SECTION WH (CUSTOM PROCEDURE TRAY) ×3 IMPLANT
PAD OB MATERNITY 4.3X12.25 (PERSONAL CARE ITEMS) ×3 IMPLANT
PENCIL SMOKE EVAC W/HOLSTER (ELECTROSURGICAL) ×3 IMPLANT
RTRCTR C-SECT PINK 25CM LRG (MISCELLANEOUS) ×3 IMPLANT
STRIP CLOSURE SKIN 1/2X4 (GAUZE/BANDAGES/DRESSINGS) ×2 IMPLANT
SUT MNCRL 0 VIOLET CTX 36 (SUTURE) ×2 IMPLANT
SUT MNCRL AB 3-0 PS2 27 (SUTURE) ×3 IMPLANT
SUT MONOCRYL 0 CTX 36 (SUTURE) ×4
SUT PLAIN 0 NONE (SUTURE) IMPLANT
SUT PLAIN 2 0 (SUTURE) ×3
SUT PLAIN ABS 2-0 CT1 27XMFL (SUTURE) ×1 IMPLANT
SUT VIC AB 0 CTX 36 (SUTURE) ×6
SUT VIC AB 0 CTX36XBRD ANBCTRL (SUTURE) ×2 IMPLANT
SUT VIC AB 2-0 CT1 27 (SUTURE) ×3
SUT VIC AB 2-0 CT1 TAPERPNT 27 (SUTURE) ×1 IMPLANT
TOWEL OR 17X24 6PK STRL BLUE (TOWEL DISPOSABLE) ×3 IMPLANT
TRAY FOLEY CATH SILVER 14FR (SET/KITS/TRAYS/PACK) ×3 IMPLANT

## 2015-09-22 NOTE — Anesthesia Procedure Notes (Signed)
Spinal Patient location during procedure: OR Start time: 09/22/2015 8:27 PM End time: 09/22/2015 8:28 PM Staffing Anesthesiologist: Catalina Gravel Performed by: anesthesiologist  Preanesthetic Checklist Completed: patient identified, surgical consent, pre-op evaluation, timeout performed, IV checked, risks and benefits discussed and monitors and equipment checked Spinal Block Patient position: sitting Prep: site prepped and draped and DuraPrep Patient monitoring: continuous pulse ox and blood pressure Approach: midline Location: L3-4 Needle Needle type: Pencan  Needle gauge: 24 G Assessment Sensory level: T4 Additional Notes Functioning IV was confirmed and monitors were applied. Sterile prep and drape, including hand hygiene, mask and sterile gloves were used. The patient was positioned and the spine was prepped. The skin was anesthetized with lidocaine.  Free flow of clear CSF was obtained prior to injecting local anesthetic into the CSF.  The spinal needle aspirated freely following injection.  The needle was carefully withdrawn.  The patient tolerated the procedure well. Consent was obtained prior to procedure with all questions answered and concerns addressed. Risks including but not limited to bleeding, infection, nerve damage, paralysis, failed block, inadequate analgesia, allergic reaction, high spinal, itching and headache were discussed and the patient wished to proceed.   Michele Morn, MD

## 2015-09-22 NOTE — MAU Note (Signed)
Pt c/o cramping and bleeding starting around 30 mins ago. Pt also has a history of pre-eclampsia with this pregnancy.

## 2015-09-22 NOTE — Brief Op Note (Signed)
09/22/2015  9:26 PM  PATIENT:  Michele Rojas  30 y.o. female  PRE-OPERATIVE DIAGNOSIS:  repeat cesarean section, suspected placenta abruption  POST-OPERATIVE DIAGNOSIS:  repeat cesarean section, suspected placenta abruption  PROCEDURE:  Procedure(s): CESAREAN SECTION (N/A)  SURGEON:  Surgeon(s) and Role:    * Jerelyn Charles, MD - Primary  ANESTHESIA:   spinal  EBL:  Total I/O In: 2000 [I.V.:2000] Out: 750 [Urine:150; Blood:600]  BLOOD ADMINISTERED:none  DRAINS: none   LOCAL MEDICATIONS USED:  NONE  SPECIMEN:  Source of Specimen:  placenta  DISPOSITION OF SPECIMEN:  PATHOLOGY  COUNTS:  YES  TOURNIQUET:  * No tourniquets in log *  DICTATION: .Note written in EPIC  PLAN OF CARE: Admit to inpatient   PATIENT DISPOSITION:  PACU - hemodynamically stable.   Delay start of Pharmacological VTE agent (>24hrs) due to surgical blood loss or risk of bleeding: not applicable

## 2015-09-22 NOTE — MAU Provider Note (Signed)
History   G2P0101 prev c/s x 1 at 35.2 admitted from home having a larg amt bright vag bleeding abd pain and cramping that started 30 min ago. Pt also states she is preeclamptic also.  CSN: [id number]  Arrival date & time 09/22/15  1929   None     Chief Complaint  Patient presents with  . Vaginal Bleeding    HPI  Past Medical History  Diagnosis Date  . Tobacco abuse   . Fatigue   . Constipation   . History of chicken pox as a child  . Headache   . Depression     Past Surgical History  Procedure Laterality Date  . Cholecystectomy, laparoscopic    . Wisdom tooth extraction    . Achilles tendon surgery    . Bartholin gland cyst excision    . Cesarean section N/A 06/09/2014    Procedure: CESAREAN SECTION;  Surgeon: Farrel Gobble. Harrington Challenger, MD;  Location: [facility];  Service: Obstetrics;  Laterality: N/A;    Family History  Problem Relation Age of Onset  . Depression Mother     after a car accident  . Hypertension Father   . Hyperlipidemia Father   . Aneurysm Maternal Grandfather   . Alzheimer's disease Other   . Depression Brother     major depression    Social History  Substance Use Topics  . Smoking status: Former Smoker -- 0.75 packs/day    Types: Cigarettes    Quit date: 05/09/2010  . Smokeless tobacco: Never Used  . Alcohol Use: No     Comment: socially    OB History    Gravida Para Term Preterm AB TAB SAB Ectopic Multiple Living   2 1  1      1       Review of Systems  Constitutional: Negative.   HENT: Negative.   Eyes: Negative.   Respiratory: Negative.   Cardiovascular: Negative.   Gastrointestinal: Positive for abdominal pain.  Endocrine: Negative.   Genitourinary: Positive for vaginal bleeding.  Musculoskeletal: Negative.   Skin: Negative.   Allergic/Immunologic: Negative.   Neurological: Negative.   Hematological: Negative.   Psychiatric/Behavioral: Negative.     Allergies  Amoxicillin and Hydrocodone  Home Medications  No current  outpatient prescriptions on file.  BP 144/95 mmHg  Pulse 106  Physical Exam  Constitutional: She is oriented to person, place, and time. She appears well-developed and well-nourished.  HENT:  Head: Normocephalic.  Eyes: Pupils are equal, round, and reactive to light.  Neck: Normal range of motion.  Cardiovascular: Normal rate, regular rhythm, normal heart sounds and intact distal pulses.   Pulmonary/Chest: Effort normal and breath sounds normal.  Abdominal: There is tenderness.  abd very tight. Tender to palpation  Genitourinary:  lg amt vag bleeding  Musculoskeletal: Normal range of motion.  Neurological: She is alert and oriented to person, place, and time. She has normal reflexes.  Skin: Skin is warm and dry.  Psychiatric: She has a normal mood and affect. Her behavior is normal. Judgment and thought content normal.    MAU Course  Procedures (including critical care time)  Labs Reviewed  CBC WITH DIFFERENTIAL/PLATELET  COMPREHENSIVE METABOLIC PANEL  PREPARE RBC (CROSSMATCH)   No results found.   No diagnosis found.    MDM  Preeclampsia, abruption at 35.2 wks. Dr. Carlis Abbott in route. Labs drawn, iv access.

## 2015-09-22 NOTE — H&P (Addendum)
30 y.o. G2P0101 @ [redacted]w[redacted]d presents with sudden onset bright red bleeding.  On arrival to MAU, pad was saturated and blood noted to be running down legs.  A new pad was also quickly saturated.  She reports cramping that started 15 minutes prior to the onset of bleeding.  Otherwise has good fetal movement.  Denies HA/BV/RUQ pain  Pregnancy c/b: 1. GHTN: w/ history of severe preeclampsia w G1. BPs have mild range in 140-150s/80-90s in the third trimester.  2. Smoker: 1/2 ppd 3. First child w history of tetrology of fallot: normal fetal ECHO this pregnancy  Past Medical History  Diagnosis Date  . Tobacco abuse   . Fatigue   . Constipation   . History of chicken pox as a child  . Headache   . Depression     Past Surgical History  Procedure Laterality Date  . Cholecystectomy, laparoscopic    . Wisdom tooth extraction    . Achilles tendon surgery    . Bartholin gland cyst excision    . Cesarean section N/A 06/09/2014    Procedure: CESAREAN SECTION;  Surgeon: Farrel Gobble. Harrington Challenger, MD;  Location: [facility];  Service: Obstetrics;  Laterality: N/A;    OB History  Gravida Para Term Preterm AB SAB TAB Ectopic Multiple Living  2 1  1      1     # Outcome Date GA Lbr Len/2nd Weight Sex Delivery Anes PTL Lv  2 Current           1 Preterm 06/09/14 [redacted]w[redacted]d  1.52 kg (3 lb 5.6 oz) M CS-LTranv Spinal  Y      Social History   Social History  . Marital Status: Married    Spouse Name: N/A  . Number of Children: N/A  . Years of Education: N/A   Occupational History  . Not on file.   Social History Main Topics  . Smoking status: Former Smoker -- 0.75 packs/day    Types: Cigarettes    Quit date: 05/09/2010  . Smokeless tobacco: Never Used  . Alcohol Use: No     Comment: socially  . Drug Use: No  . Sexual Activity: Yes   Other Topics Concern  . Not on file   Social History Narrative   GYN- Dr. 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 [facility]   Amoxicillin and Hydrocodone    Prenatal Transfer Tool  Maternal Diabetes: No Genetic Screening: Declined Maternal Ultrasounds/Referrals: Normal Fetal Ultrasounds or other Referrals:  Referred to Materal Fetal Medicine  Maternal Substance Abuse:  Yes:  Type: Smoker Significant Maternal Medications:  None Significant Maternal Lab Results: None    Filed Vitals:   09/22/15 1938  BP: 144/95  Pulse: 99  Temp: 98.7 F (37.1 C)  Resp: 18     General:  Anxious Abdomen:  firm, gravid Ex:  1+ edema SSE:  Large active bleeding, pad is saturated prior to exam, unable to visualize cervix due to significant bleeding SVE:  1/70/-2 FHTs:  130s, mod var, + accels, no decels    A/P   30 y.o. [id number] [redacted]w[redacted]d presents with vaginal bleeding and concern for placental abruption Significant vaginal bleeding in the third trimester.  Risk factors for placental abruption include GHTN and smoker.  Given significant bleeding, recommend proceeding to the OR at this time for RCS.  Discussed risks to include infection,  bleeding, damage to surrounding structures (inlcuding but not limited to: bowel, bladder, tubes ovaries, nerves, vessels, baby) need for blood transfusion, risk of vte, need for additional procedure.   Hgb last week was 10.7.  2 IVs in place, T&C pending 2gm ancef on call to [facility]

## 2015-09-22 NOTE — Anesthesia Preprocedure Evaluation (Addendum)
Anesthesia Evaluation  Patient identified by MRN, date of birth, ID band Patient awake  General Assessment Comment:G2P0101 prev c/s x 1 at 35.2 admitted from home having a larg amt bright vag bleeding abd pain and cramping that started 30 min ago. Pt also states she is preeclamptic also.  Reviewed: Allergy & Precautions, NPO status , Patient's Chart, lab work & pertinent test results  History of Anesthesia Complications Negative for: history of anesthetic complications  Airway Mallampati: II  TM Distance: >3 FB Neck ROM: Full    Dental  (+) Teeth Intact, Dental Advisory Given, Caps   Pulmonary neg pulmonary ROS, former smoker,    Pulmonary exam normal breath sounds clear to auscultation       Cardiovascular hypertension (Pre-eclampsia), Normal cardiovascular exam Rhythm:Regular Rate:Normal     Neuro/Psych  Headaches, PSYCHIATRIC DISORDERS Depression    GI/Hepatic Neg liver ROS, GERD  Medicated,  Endo/Other  Obesity   Renal/GU negative Renal ROS     Musculoskeletal negative musculoskeletal ROS (+)   Abdominal   Peds  Hematology  (+) Blood dyscrasia, anemia , Plt 199k    Anesthesia Other Findings Day of surgery medications reviewed with the patient.  Reproductive/Obstetrics (+) Pregnancy Possible abruption                            Anesthesia Physical Anesthesia Plan  ASA: III and emergent  Anesthesia Plan: Spinal   Post-op Pain Management:    Induction:   Airway Management Planned: Nasal Cannula  Additional Equipment:   Intra-op Plan:   Post-operative Plan:   Informed Consent: I have reviewed the patients History and Physical, chart, labs and discussed the procedure including the risks, benefits and alternatives for the proposed anesthesia with the patient or authorized representative who has indicated his/her understanding and acceptance.   Dental advisory given  Plan  Discussed with: CRNA, Anesthesiologist and Surgeon  Anesthesia Plan Comments: (Discussed risks and benefits of and differences between spinal and general. Discussed risks of spinal including headache, backache, failure, bleeding, infection, and nerve damage. Patient consents to spinal. Questions answered. Coagulation studies and platelet count acceptable.)        Anesthesia Quick Evaluation

## 2015-09-22 NOTE — Transfer of Care (Signed)
Immediate Anesthesia Transfer of Care Note  Patient: Michele Rojas  Procedure(s) Performed: Procedure(s): CESAREAN SECTION (N/A)  Patient Location: PACU  Anesthesia Type:Spinal  Level of Consciousness: awake, alert  and oriented  Airway & Oxygen Therapy: Patient Spontanous Breathing  Post-op Assessment: Report given to RN and Post -op Vital signs reviewed and stable  Post vital signs: Reviewed and stable  Last Vitals:  Filed Vitals:   09/22/15 2141 09/22/15 2143  BP: 142/87   Pulse:  86  Temp:    Resp:      Complications: No apparent anesthesia complications

## 2015-09-22 NOTE — Op Note (Signed)
Cesarean Section Procedure Note  Pre-operative Diagnosis: 1. Intrauterine pregnancy at 102w2d  2. Vaginal bleeding, suspected placental abruption  3. Gestational hypertension  Post-operative Diagnosis: same as above  Surgeon: Jerelyn Charles, MD  Procedure: Repeat low transverse cesarean section   Anesthesia: Spinal anesthesia  Estimated Blood Loss: 600 mL         Drains: Foley catheter         Specimens: placenta to pathology         Implants: none         Complications:  None; patient tolerated the procedure well.         Disposition: PACU - hemodynamically stable.  Findings:  Normal uterus, tubes and ovaries bilaterally.  Viable female infant, weight pending Apgars 8, 9.    Procedure Details   After spinal anesthesia was found to adequate , the patient was placed in the dorsal supine position with a leftward tilt, draped and prepped in the usual sterile manner. A Pfannenstiel incision was made and carried down through the subcutaneous tissue to the fascia. The fascia was incised in the midline and the fascial incision was extended laterally with Mayo scissors. The superior aspect of the fascial incision was grasped with two Kocher clamp, tented up and the rectus muscles dissected off sharply. The rectus was then dissected off with blunt dissection and Mayo scissors inferiorly. The rectus muscles were separated in the midline. The abdominal peritoneum was identified, tented up, entered sharply, and the incision was extended superiorly and inferiorly with good visualization of the bladder. The Alexis retractor was deployed. The vesicouterine peritoneum was identified, tented up, entered sharply, and the bladder flap was created digitally. Scalpel was then used to make a low transverse incision on the uterus which was extended in the cephalad-caudad direction with blunt dissection. The fluid was clear. The fetal vertex was identified, elevated out of the pelvis and brought to the  hysterotomy. The head was delivered easily followed by the shoulders and body. The cord was clamped and cut and the infant was passed to the waiting neonatologist. Placenta was then delivered spontaneously.  There was a large baseball sized clot and multiple smaller clots that delivered with the placenta.  The uterus was cleared of all clot and debris   The hysterotomy was repaired with #0 Monocryl in running locked fashion.   The serosal edges of the incision were oozy, and bovie cautery was used to achieve hemostasis.  The hysterotomy was reexamined and excellent hemostasis was noted.  The Alexis retractor was removed from the abdomen. The peritoneum was examined and all vessels noted to be hemostatic. The abdominal cavity was cleared of all clot and debris.  The peritoneum was closed with 2-0 vicryl in a running fashion. The fascia and rectus muscles were inspected and were hemostatic. The fascia was closed with 0 Vicryl in a running fashion. There was a small area to the right of midline where the fascia appeared weaker.  An additional running suture of 0 Vicryl was used to reinforce this area. The subcuticular layer was irrigated and all bleeders cauterized.  The subcutaneous layer was re approximated with interrupted 3-0 plain gut.  The skin was closed with 3-0 monocryl in a subcuticular fashion. The incision was dressed with benzoine, steri strips and pressure dressing. All sponge lap and needle counts were correct x3. Patient tolerated the procedure well and recovered in stable condition following the procedure.

## 2015-09-23 ENCOUNTER — Encounter (HOSPITAL_COMMUNITY): Payer: Self-pay | Admitting: *Deleted

## 2015-09-23 DIAGNOSIS — O4590 Premature separation of placenta, unspecified, unspecified trimester: Secondary | ICD-10-CM | POA: Diagnosis present

## 2015-09-23 LAB — CBC
HEMATOCRIT: 28.5 % — AB (ref 36.0–46.0)
HEMOGLOBIN: 9.7 g/dL — AB (ref 12.0–15.0)
MCH: 29.4 pg (ref 26.0–34.0)
MCHC: 34 g/dL (ref 30.0–36.0)
MCV: 86.4 fL (ref 78.0–100.0)
Platelets: 179 10*3/uL (ref 150–400)
RBC: 3.3 MIL/uL — ABNORMAL LOW (ref 3.87–5.11)
RDW: 13.8 % (ref 11.5–15.5)
WBC: 19.8 10*3/uL — ABNORMAL HIGH (ref 4.0–10.5)

## 2015-09-23 MED ORDER — MENTHOL 3 MG MT LOZG: 1.0000 | LOZENGE | OROMUCOSAL | Status: DC | PRN

## 2015-09-23 MED ORDER — OXYCODONE-ACETAMINOPHEN 5-325 MG PO TABS: 2.0000 | ORAL_TABLET | ORAL | Status: DC | PRN

## 2015-09-23 MED ORDER — IBUPROFEN 600 MG PO TABS
600.0000 mg | ORAL_TABLET | Freq: Four times a day (QID) | ORAL | Status: DC
  Administered 2015-09-23 – 2015-09-25 (×8): 600 mg via ORAL
  Filled 2015-09-23 (×9): qty 1

## 2015-09-23 MED ORDER — DIPHENHYDRAMINE HCL 25 MG PO CAPS
25.0000 mg | ORAL_CAPSULE | ORAL | Status: DC | PRN
  Filled 2015-09-23: qty 1

## 2015-09-23 MED ORDER — SODIUM CHLORIDE 0.9% FLUSH: 3.0000 mL | INTRAVENOUS | Status: DC | PRN

## 2015-09-23 MED ORDER — ACETAMINOPHEN 325 MG PO TABS: 650.0000 mg | ORAL_TABLET | ORAL | Status: DC | PRN

## 2015-09-23 MED ORDER — SIMETHICONE 80 MG PO CHEW: 80.0000 mg | CHEWABLE_TABLET | ORAL | Status: DC | PRN

## 2015-09-23 MED ORDER — DIPHENHYDRAMINE HCL 25 MG PO CAPS: 25.0000 mg | ORAL_CAPSULE | Freq: Four times a day (QID) | ORAL | Status: DC | PRN

## 2015-09-23 MED ORDER — NALOXONE HCL 2 MG/2ML IJ SOSY
1.0000 ug/kg/h | PREFILLED_SYRINGE | INTRAVENOUS | Status: DC | PRN
  Filled 2015-09-23: qty 2

## 2015-09-23 MED ORDER — LANOLIN HYDROUS EX OINT: 1.0000 "application " | TOPICAL_OINTMENT | CUTANEOUS | Status: DC | PRN

## 2015-09-23 MED ORDER — OXYCODONE-ACETAMINOPHEN 5-325 MG PO TABS
1.0000 | ORAL_TABLET | ORAL | Status: DC | PRN
  Administered 2015-09-25: 1 via ORAL
  Filled 2015-09-23: qty 1

## 2015-09-23 MED ORDER — PRENATAL MULTIVITAMIN CH
1.0000 | ORAL_TABLET | Freq: Every day | ORAL | Status: DC
  Administered 2015-09-23 – 2015-09-24 (×2): 1 via ORAL
  Filled 2015-09-23 (×2): qty 1

## 2015-09-23 MED ORDER — DIPHENHYDRAMINE HCL 50 MG/ML IJ SOLN
12.5000 mg | INTRAMUSCULAR | Status: DC | PRN
Start: 2015-09-23 — End: 2015-09-25

## 2015-09-23 MED ORDER — ONDANSETRON HCL 4 MG/2ML IJ SOLN: 4.0000 mg | Freq: Three times a day (TID) | INTRAMUSCULAR | Status: DC | PRN

## 2015-09-23 MED ORDER — OXYTOCIN 10 UNIT/ML IJ SOLN: 2.5000 [IU]/h | INTRAVENOUS | Status: AC

## 2015-09-23 MED ORDER — SENNOSIDES-DOCUSATE SODIUM 8.6-50 MG PO TABS
2.0000 | ORAL_TABLET | ORAL | Status: DC
  Administered 2015-09-23: 2 via ORAL
  Filled 2015-09-23 (×2): qty 2

## 2015-09-23 MED ORDER — NALOXONE HCL 0.4 MG/ML IJ SOLN: 0.4000 mg | INTRAMUSCULAR | Status: DC | PRN

## 2015-09-23 MED ORDER — DIBUCAINE 1 % RE OINT: 1.0000 "application " | TOPICAL_OINTMENT | RECTAL | Status: DC | PRN

## 2015-09-23 MED ORDER — LACTATED RINGERS IV SOLN: INTRAVENOUS | Status: DC

## 2015-09-23 MED ORDER — WITCH HAZEL-GLYCERIN EX PADS: 1.0000 "application " | MEDICATED_PAD | CUTANEOUS | Status: DC | PRN

## 2015-09-23 MED ORDER — SIMETHICONE 80 MG PO CHEW
80.0000 mg | CHEWABLE_TABLET | Freq: Three times a day (TID) | ORAL | Status: DC
  Administered 2015-09-23 – 2015-09-25 (×6): 80 mg via ORAL
  Filled 2015-09-23 (×6): qty 1

## 2015-09-23 MED ORDER — FLUOXETINE HCL 20 MG PO CAPS
20.0000 mg | ORAL_CAPSULE | Freq: Every day | ORAL | Status: DC
  Administered 2015-09-23 – 2015-09-25 (×3): 20 mg via ORAL
  Filled 2015-09-23 (×4): qty 1

## 2015-09-23 MED ORDER — TETANUS-DIPHTH-ACELL PERTUSSIS 5-2.5-18.5 LF-MCG/0.5 IM SUSP: 0.5000 mL | Freq: Once | INTRAMUSCULAR | Status: DC

## 2015-09-23 MED ORDER — SIMETHICONE 80 MG PO CHEW
80.0000 mg | CHEWABLE_TABLET | ORAL | Status: DC
  Administered 2015-09-23 – 2015-09-25 (×2): 80 mg via ORAL
  Filled 2015-09-23 (×2): qty 1

## 2015-09-23 NOTE — Progress Notes (Signed)
Pt requesting not to get up at this time for orthostatics.

## 2015-09-23 NOTE — Anesthesia Postprocedure Evaluation (Signed)
Anesthesia Post Note  Patient: Michele Rojas  Procedure(s) Performed: Procedure(s) (LRB): CESAREAN SECTION (N/A)  Patient location during evaluation: Mother Baby Anesthesia Type: Spinal Level of consciousness: awake and alert and oriented Pain management: pain level controlled Vital Signs Assessment: post-procedure vital signs reviewed and stable Respiratory status: spontaneous breathing Cardiovascular status: blood pressure returned to baseline Postop Assessment: no headache, no backache, patient able to bend at knees, no signs of nausea or vomiting and adequate PO intake Anesthetic complications: no    Last Vitals:  Filed Vitals:   09/23/15 0236 09/23/15 0634  BP: 126/74 127/74  Pulse: 52 63  Temp: 36.5 C 36.5 C  Resp: 18 18    Last Pain:  Filed Vitals:   09/23/15 0655  PainSc: 0-No pain                 Shalene Gallen

## 2015-09-23 NOTE — Progress Notes (Signed)
  Patient is eating, ambulating, voiding.  Pain control is good.  Filed Vitals:   09/23/15 0033 09/23/15 0135 09/23/15 0236 09/23/15 0634  BP: 138/90 130/76 126/74 127/74  Pulse: 64 53 52 63  Temp: 97.7 F (36.5 C)  97.7 F (36.5 C) 97.7 F (36.5 C)  TempSrc: Oral  Oral Oral  Resp: 18 18 18 18   Height:      Weight:      SpO2: 98% 97% 97% 97%    lungs:   clear to auscultation cor:    RRR Abdomen:  soft, appropriate tenderness, incisions intact and without erythema or exudate ex:    no cords   Lab Results  Component Value Date   WBC 19.8* 09/23/2015   HGB 9.7* 09/23/2015   HCT 28.5* 09/23/2015   MCV 86.4 09/23/2015   PLT 179 09/23/2015    --/--/A NEG (02/13 2000)/RI  A/P    Post operative day 1.  Baby is RH NEG- no Rhogam.  Routine post op and postpartum care.  Expect d/c routine.  Percocet for pain control.

## 2015-09-23 NOTE — Anesthesia Postprocedure Evaluation (Signed)
Anesthesia Post Note  Patient: Michele Rojas  Procedure(s) Performed: Procedure(s) (LRB): CESAREAN SECTION (N/A)  Patient location during evaluation: PACU Anesthesia Type: Spinal Level of consciousness: oriented, awake and alert, awake and patient cooperative Pain management: pain level controlled Vital Signs Assessment: post-procedure vital signs reviewed and stable Respiratory status: spontaneous breathing, respiratory function stable and patient connected to nasal cannula oxygen Cardiovascular status: blood pressure returned to baseline and stable Postop Assessment: no headache, no backache, spinal receding, patient able to bend at knees and no signs of nausea or vomiting Anesthetic complications: no    Last Vitals:  Filed Vitals:   09/23/15 0135 09/23/15 0236  BP: 130/76 126/74  Pulse: 53 52  Temp:  36.5 C  Resp: 18 18    Last Pain:  Filed Vitals:   09/23/15 0236  PainSc: 3                  Catalina Gravel

## 2015-09-23 NOTE — Addendum Note (Signed)
Addendum  created 09/23/15 0746 by Talbot Grumbling, CRNA   Modules edited: Clinical Notes   Clinical Notes:  File: [id number]

## 2015-09-24 NOTE — Progress Notes (Signed)
MOB was referred for history of depression/anxiety.  Referral is screened out by Clinical Social Worker because none of the following criteria appear to apply: -History of anxiety/depression during this pregnancy, or of post-partum depression. - Diagnosis of anxiety and/or depression within last 3 years or -MOB's symptoms are currently being treated with medication and/or therapy. MOB is prescribed Prozac.    Please contact the Clinical Social Worker if needs arise or upon MOB request.

## 2015-09-24 NOTE — Progress Notes (Signed)
Subjective: Postpartum Day 2: Cesarean Delivery Patient reports pain controlled, no nausea or vomiting, appropriate lochia, bottle feeding  Objective: Vital signs in last 24 hours: Temp:  [97.6 F (36.4 C)-98.4 F (36.9 C)] 97.6 F (36.4 C) (02/15 0502) Pulse Rate:  [51-87] 71 (02/15 0502) Resp:  [16-18] 18 (02/15 0502) BP: (115-126)/(61-75) 126/70 mmHg (02/15 0502) SpO2:  [97 %-98 %] 98 % (02/14 2031)  Physical Exam:  General: alert, cooperative and appears stated age Lochia: appropriate Uterine Fundus: firm Incision: healing well DVT Evaluation: No evidence of DVT seen on physical exam.   Recent Labs  09/22/15 1950 09/23/15 0535  HGB 10.9* 9.7*  HCT 31.7* 28.5*    Assessment/Plan: Status post Cesarean section. Doing well postoperatively.  Continue current care. Baby being monitored for jaundice. Has another bili today @ 5pm. Will likely need discahrge tomorrow  Salik Grewell H. 09/24/2015, 9:43 AM

## 2015-09-25 MED ORDER — OXYCODONE HCL 5 MG PO TABS: 5.0000 mg | ORAL_TABLET | Freq: Four times a day (QID) | ORAL | Status: DC | PRN

## 2015-09-25 MED ORDER — IBUPROFEN 600 MG PO TABS: 600.0000 mg | ORAL_TABLET | Freq: Four times a day (QID) | ORAL | Status: DC

## 2015-09-25 NOTE — Discharge Instructions (Signed)
You may wash incision with soap and water.   °Do not soak the incision for 2 weeks (no tub baths or swimming).   °Keep incision dry. You may need to keep a sanitary pad or panty liner between the incision and your clothing for comfort and to keep the incision dry.  If you note drainage, increased pain, or increased redness of the incision, then please notify your physician. ° °Pelvic rest x 6 weeks (no intercourse or tampons)  ° °No lifting over 10 lbs for 6 weeks.  ° °Do not drive until you are not taking narcotic pain medication AND you can comfortably slam on the brakes. ° °You have a mild anemia due to blood loss from delivery.  Please continue to take a prenatal vitamin daily and add an iron supplement (one tablet daily).  You may experience constipation from the iron, so add a stool softener as needed. ° °

## 2015-09-25 NOTE — Progress Notes (Signed)
Patient is doing well.  She is ambulating, voiding, tolerating PO.  Pain control is good.  Lochia is appropriate  Filed Vitals:   09/23/15 2031 09/24/15 0502 09/24/15 1756 09/25/15 0636  BP: 123/65 126/70 143/87 124/72  Pulse: 62 71 72 74  Temp: 97.9 F (36.6 C) 97.6 F (36.4 C) 98.7 F (37.1 C) 98.3 F (36.8 C)  TempSrc: Oral Oral Oral Oral  Resp: _0 Height:      Weight:      SpO2: 98%  98%     NAD Fundus firm, incision c/d/i Ext: tr edema b/l  Lab Results  Component Value Date   WBC 19.8* 09/23/2015   HGB 9.7* 09/23/2015   HCT 28.5* 09/23/2015   MCV 86.4 09/23/2015   PLT 179 09/23/2015    --/--/A NEG (02/13 2000)/RNI  A/P 30 y.o. [id number] POD#3 s/p RCS at 35 wks for placental abruption. Routine care.   RNI--declined MMR Rh neg--baby Rh neg--rhogam not indicated H/o depression--short interval f/u in office Expect d/c today.    [place]

## 2015-09-25 NOTE — Discharge Summary (Signed)
Obstetric Discharge Summary Reason for Admission: abruption Prenatal Procedures: none Intrapartum Procedures: cesarean: low cervical, transverse Postpartum Procedures: none Complications-Operative and Postpartum: none HEMOGLOBIN  Date Value Ref Range Status  09/23/2015 9.7* 12.0 - 15.0 g/dL Final   HCT  Date Value Ref Range Status  09/23/2015 28.5* 36.0 - 46.0 % Final    Physical Exam:  General: alert, cooperative and appears stated age 30: appropriate Uterine Fundus: firm Incision: healing well, no significant drainage DVT Evaluation: No evidence of DVT seen on physical exam.  Discharge Diagnoses: Term Pregnancy-delivered  Discharge Information: Date: 09/25/2015 Activity: pelvic rest Diet: routine Medications: PNV, Ibuprofen and Percocet Condition: stable Instructions: refer to practice specific booklet Discharge to: home Follow-up Information    Follow up with [facility] GEFFEL Carlis Abbott, MD In 10 days.   Specialty:  Obstetrics   Why:  incision check, mood check   Contact information:   [address] [place] [address] [phone]       Newborn Data: Live born female  Birth Weight: 6 lb 0.1 oz (2725 g) APGAR: 8, 9  Home with mother.  [place] 09/25/2015, 9:01 AM

## 2015-09-25 NOTE — Progress Notes (Signed)
Rubella non immune per prenatal records. Provided patient information. Declines vaccine at this time. States she will get in the doctor's office in a couple of weeks during her recheck. Maxwell Caul, Leretha Dykes [place]

## 2015-09-26 LAB — TYPE AND SCREEN
ABO/RH(D): A NEG
Antibody Screen: POSITIVE
DAT, IgG: NEGATIVE
UNIT DIVISION: 0
Unit division: 0

## 2015-10-06 ENCOUNTER — Inpatient Hospital Stay (HOSPITAL_COMMUNITY): Admission: RE | Admit: 2015-10-06 | Payer: Managed Care, Other (non HMO) | Source: Ambulatory Visit

## 2015-10-07 ENCOUNTER — Encounter (HOSPITAL_COMMUNITY): Admission: RE | Payer: Self-pay | Source: Ambulatory Visit

## 2015-10-07 ENCOUNTER — Inpatient Hospital Stay (HOSPITAL_COMMUNITY)
Admission: RE | Admit: 2015-10-07 | Payer: Managed Care, Other (non HMO) | Source: Ambulatory Visit | Admitting: Obstetrics and Gynecology

## 2015-10-07 SURGERY — Surgical Case
Anesthesia: Regional

## 2016-01-07 ENCOUNTER — Encounter: Payer: Self-pay | Admitting: Family Medicine

## 2016-01-07 ENCOUNTER — Ambulatory Visit (INDEPENDENT_AMBULATORY_CARE_PROVIDER_SITE_OTHER): Payer: Managed Care, Other (non HMO) | Admitting: Family Medicine

## 2016-01-07 VITALS — BP 145/80 | HR 97 | Temp 98.4°F | Ht 66.5 in | Wt 183.0 lb

## 2016-01-07 DIAGNOSIS — I10 Essential (primary) hypertension: Secondary | ICD-10-CM

## 2016-01-07 DIAGNOSIS — F17200 Nicotine dependence, unspecified, uncomplicated: Secondary | ICD-10-CM

## 2016-01-07 DIAGNOSIS — O99345 Other mental disorders complicating the puerperium: Secondary | ICD-10-CM

## 2016-01-07 DIAGNOSIS — F53 Postpartum depression: Secondary | ICD-10-CM

## 2016-01-07 MED ORDER — HYDROCHLOROTHIAZIDE 25 MG PO TABS: 25.0000 mg | ORAL_TABLET | Freq: Every day | ORAL | Status: DC

## 2016-01-07 NOTE — Patient Instructions (Addendum)
Keep thinking about quitting smoking when you are ready-this will help your blood pressure  Start hctz 25 mg once daily in am  If any intolerable side effects let me know  Work on diet with less processed food  Get as much exercise as you can  Continue prozac if it is helping  Take care of yourself   Follow up with me in about a month

## 2016-01-07 NOTE — Progress Notes (Signed)
Pre visit review using our clinic review tool, if applicable. No additional management support is needed unless otherwise documented below in the visit note. 

## 2016-01-07 NOTE — Progress Notes (Signed)
Subjective:    Patient ID: Michele Rojas, female    DOB: 1986-03-02, 30 y.o.   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 which helps a lot   Smoking - about a pack per day  She quit for part of pregnancy and started back   BP Readings from Last 3 Encounters:  01/07/16 148/82  09/25/15 124/72  07/08/15 138/82   Elevated bp ever since she had the baby  Last time she checked it 149/92  Thinks her dad has high bp  Has had a few headaches A few episodes of lightheadedness   Not breast feeding    Just had depo shot 3 weeks ago  Trying it for convenience  A few hot flashes  Has been bleeding ever since     In past was on labetalol (with pre eclampsia with first preg)   Patient Active Problem List   Diagnosis Date Noted  . 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 Essential hypertension 01/07/2016  . Placental abruption 09/23/2015  . Pre-eclampsia 06/09/2014  . Preeclampsia 05/28/2014  . Encounter for biometric screening 09/30/2013  . Screening for lipoid disorders 09/28/2013  . Diabetes mellitus screening 09/28/2013  . Sore throat 11/15/2011  . Hypothyroid 11/04/2011  . Depression 10/04/2011  . Diarrhea 01/19/2011  . RLQ PAIN 03/20/2010  . HEADACHE 11/18/2009  . MOOD SWINGS 11/17/2009  . TRANSAMINASES, SERUM, ELEVATED 10/13/2009  . FATIGUE 10/06/2009  . TOBACCO USE 02/19/2009  . CHICKENPOX, HX OF 02/19/2009  . DEFICIENCY OF OTHER VITAMINS 04/29/2008  . OTHER CONSTIPATION 04/29/2008   Past Medical History  Diagnosis Date  . Tobacco abuse   . Fatigue   . Constipation   . History of chicken pox as a child  . Headache     migraines    . Depression   . Hypothyroidism     no meds  . Preeclampsia     2015 & 2017 pregnancies  . Mood swings ([place])   . Vitamin deficiency   . History of preterm delivery     2015- 31wks, 2017- 35wks  . HPV (human papilloma virus) infection   . Hx of abnormal cervical Pap smear     ASCUS, LGSIL, CIN I, [place]  . ADD (attention deficit disorder)     no meds  . Smoker    Past Surgical History  Procedure Laterality Date  . Cholecystectomy, laparoscopic    . Wisdom tooth extraction    . Achilles tendon surgery    . Bartholin gland cyst excision    . Cesarean section N/A 06/09/2014    Procedure: CESAREAN SECTION;  Surgeon: Farrel Gobble. Harrington Challenger, MD;  Location: [facility];  Service: Obstetrics;  Laterality: N/A;  . Cesarean section N/A 09/22/2015    Procedure: CESAREAN SECTION;  Surgeon: Jerelyn Charles, MD;  Location: [facility];  Service: Obstetrics;  Laterality: N/A;   Social History  Substance Use Topics  . Smoking status: Current Every Day Smoker -- 1.00 packs/day    Types: Cigarettes    Last Attempt to Quit: 05/09/2010  . Smokeless  tobacco: Never Used  . Alcohol Use: No     Comment: socially   Family History  Problem Relation Age of Onset  . Depression Mother     after a car accident  . Hypertension Father   . Hyperlipidemia Father   . Aneurysm Maternal Grandfather   . Alzheimer's disease Other   . Depression Brother     major depression   Allergies  Allergen Reactions  . Amoxicillin Other (See Comments)    REACTION: yeast infection in mouth Has patient had a PCN reaction causing immediate rash, facial/tongue/throat swelling, SOB or lightheadedness with hypotension: No Has patient had a PCN reaction causing severe rash involving mucus membranes or skin necrosis: No Has patient had a PCN reaction that required hospitalization no Has patient had a PCN reaction occurring within the last 10 years: Yes If all of the above answers are "NO", then may proceed with Cephalosporin use.   No  current outpatient prescriptions on file prior to visit.   No current facility-administered medications on file prior to visit.    Review of Systems Review of Systems  Constitutional: Negative for fever, appetite change,  and unexpected weight change. pos for fatigue  Eyes: Negative for pain and visual disturbance.  Respiratory: Negative for cough and shortness of breath.   Cardiovascular: Negative for cp or palpitations    Gastrointestinal: Negative for nausea, diarrhea and constipation.  Genitourinary: Negative for urgency and frequency. pos for breakthrough bleeding on depo provera Skin: Negative for pallor or rash   Neurological: Negative for weakness, light-headedness, numbness and pos for occ headaches.  Hematological: Negative for adenopathy. Does not bruise/bleed easily.  Psychiatric/Behavioral: pos for pp depression that has improved with tx        Objective:   Physical Exam  Constitutional: She appears well-developed and well-nourished. No distress.  obese and well appearing   HENT:  Head: Normocephalic and atraumatic.  Mouth/Throat: Oropharynx is clear and moist.  Eyes: Conjunctivae and EOM are normal. Pupils are equal, round, and reactive to light.  Neck: Normal range of motion. Neck supple. No JVD present. Carotid bruit is not present. No thyromegaly present.  Cardiovascular: Normal rate, regular rhythm, normal heart sounds and intact distal pulses.  Exam reveals no gallop.   Pulmonary/Chest: Effort normal and breath sounds normal. No respiratory distress. She has no wheezes. She has no rales.  No crackles  Abdominal: Soft. Bowel sounds are normal. She exhibits no distension, no abdominal bruit and no mass. There is no tenderness.  Musculoskeletal: She exhibits no edema.  Lymphadenopathy:    She has no cervical adenopathy.  Neurological: She is alert. She has normal reflexes. She exhibits normal muscle tone. Coordination normal.  Skin: Skin is warm and dry. No rash  noted.  Psychiatric: Her speech is normal and behavior is normal. Thought content normal. Her mood appears not anxious. Her affect is blunt. She does not exhibit a depressed mood.  Blunted affect today Seems fatigued           Assessment & Plan:   Problem List Items Addressed This Visit      Cardiovascular and Mediastinum   Essential hypertension    HTN continues after pregnancy  On depo provera Disc lifestyle change/DASH diet/self care and wt loss  Will try hctz (worried a beta blocker may worsen pp dep) Rev poss side eff-update F/u 1 mo lab and visit        Relevant Medications   hydrochlorothiazide (HYDRODIURIL) 25 MG tablet  Other   TOBACCO USE - Primary    Disc in detail risks of smoking and possible outcomes including copd, vascular/ heart disease, cancer , respiratory and sinus infections  Pt voices understanding  Pt is not ready to quit yet  When she is -will likely request chantix which has helped in the past       RLQ PAIN   Post partum depression    Doing better with prozac currently  Not breast feeding  Disc imp of self care when able      Relevant Medications   FLUoxetine (PROZAC) 20 MG capsule

## 2016-01-08 DIAGNOSIS — F53 Postpartum depression: Secondary | ICD-10-CM | POA: Insufficient documentation

## 2016-01-08 DIAGNOSIS — O99345 Other mental disorders complicating the puerperium: Secondary | ICD-10-CM | POA: Insufficient documentation

## 2016-01-08 NOTE — Assessment & Plan Note (Signed)
Doing better with prozac currently  Not breast feeding  Disc imp of self care when able

## 2016-01-08 NOTE — Assessment & Plan Note (Signed)
HTN continues after pregnancy  On depo provera Disc lifestyle change/DASH diet/self care and wt loss  Will try hctz (worried a beta blocker may worsen pp dep) Rev poss side eff-update F/u 1 mo lab and visit

## 2016-01-08 NOTE — Assessment & Plan Note (Signed)
Disc in detail risks of smoking and possible outcomes including copd, vascular/ heart disease, cancer , respiratory and sinus infections  Pt voices understanding  Pt is not ready to quit yet  When she is -will likely request chantix which has helped in the past

## 2016-02-11 ENCOUNTER — Ambulatory Visit (INDEPENDENT_AMBULATORY_CARE_PROVIDER_SITE_OTHER): Payer: Managed Care, Other (non HMO) | Admitting: Family Medicine

## 2016-02-11 ENCOUNTER — Encounter: Payer: Self-pay | Admitting: Family Medicine

## 2016-02-11 VITALS — BP 126/88 | HR 121 | Wt 183.2 lb

## 2016-02-11 DIAGNOSIS — F53 Postpartum depression: Secondary | ICD-10-CM

## 2016-02-11 DIAGNOSIS — I10 Essential (primary) hypertension: Secondary | ICD-10-CM | POA: Diagnosis not present

## 2016-02-11 DIAGNOSIS — F17200 Nicotine dependence, unspecified, uncomplicated: Secondary | ICD-10-CM | POA: Diagnosis not present

## 2016-02-11 DIAGNOSIS — O99345 Other mental disorders complicating the puerperium: Secondary | ICD-10-CM

## 2016-02-11 MED ORDER — HYDROCHLOROTHIAZIDE 25 MG PO TABS: 25.0000 mg | ORAL_TABLET | Freq: Every day | ORAL | Status: DC

## 2016-02-11 NOTE — Progress Notes (Signed)
Subjective:    Patient ID: Michele Rojas, female    DOB: Nov 01, 1985, 30 y.o.   MRN: [id number]  HPI Here for f/u of HTN   Last visit put on hctz It made her tired at first (however was recently pregnant)  BP Readings from Last 3 Encounters:  02/11/16 126/88  01/07/16 145/80  09/25/15 124/72    Checked once at gyn- was about the same   Not exercising  Trying to drink enough water / drinks other beverages (does drink a fair amt of sweet tea)  Much improved  Due for labs    Chemistry      Component Value Date/Time   NA 137 09/22/2015 1950   K 3.3* 09/22/2015 1950   CL 105 09/22/2015 1950   CO2 22 09/22/2015 1950   BUN 6 09/22/2015 1950   CREATININE 0.47 09/22/2015 1950      Component Value Date/Time   CALCIUM 8.5* 09/22/2015 1950   ALKPHOS 103 09/22/2015 1950   AST 28 09/22/2015 1950   ALT 24 09/22/2015 1950   BILITOT 0.3 09/22/2015 1950       Smoking status  No change -not ready to quit yet  Stress level is high - 3 deaths in the family in the past week    Wt is stable with bmi of 29  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 Essential hypertension 01/07/2016  . Placental abruption 09/23/2015  . Pre-eclampsia 06/09/2014  . Preeclampsia 05/28/2014  . Encounter for biometric screening 09/30/2013  . Screening for lipoid disorders 09/28/2013  . Diabetes mellitus screening 09/28/2013  . Sore throat 11/15/2011  . Hypothyroid 11/04/2011  . Depression 10/04/2011  . Diarrhea 01/19/2011  . RLQ PAIN 03/20/2010  . HEADACHE 11/18/2009  . MOOD SWINGS 11/17/2009  . TRANSAMINASES, SERUM, ELEVATED 10/13/2009  . FATIGUE 10/06/2009  . TOBACCO USE 02/19/2009  . CHICKENPOX, HX OF 02/19/2009  . DEFICIENCY OF OTHER VITAMINS 04/29/2008  . OTHER CONSTIPATION  04/29/2008   Past Medical History  Diagnosis Date  . Tobacco abuse   . Fatigue   . Constipation   . History of chicken pox as a child  . Headache     migraines  . Depression   . Hypothyroidism     no meds  . Preeclampsia     2015 & 2017 pregnancies  . Mood swings ([place])   . Vitamin deficiency   . History of preterm delivery     2015- 31wks, 2017- 35wks  . HPV (human papilloma virus) infection   . Hx of abnormal cervical Pap smear     ASCUS, LGSIL, CIN I, [place]  . ADD (attention deficit disorder)     no meds  . Smoker    Past Surgical History  Procedure Laterality Date  . Cholecystectomy, laparoscopic    . Wisdom tooth extraction    . Achilles tendon surgery    . Bartholin gland cyst excision    . Cesarean section N/A 06/09/2014    Procedure: CESAREAN SECTION;  Surgeon: Farrel Gobble. Harrington Challenger, MD;  Location: [facility];  Service: Obstetrics;  Laterality: N/A;  . Cesarean section N/A 09/22/2015    Procedure: CESAREAN SECTION;  Surgeon: Jerelyn Charles, MD;  Location: [facility];  Service: Obstetrics;  Laterality: N/A;   Social History  Substance Use Topics  . Smoking status: Current Every Day Smoker -- 1.00 packs/day    Types: Cigarettes    Last Attempt to Quit: 05/09/2010  . Smokeless tobacco: Never Used  . Alcohol Use: No     Comment: socially   Family History  Problem Relation Age of Onset  . Depression Mother     after a car accident  . Hypertension Father   . Hyperlipidemia Father   . Aneurysm Maternal Grandfather   . Alzheimer's disease Other   . Depression Brother     major depression  . Graves' disease Paternal Uncle    Allergies  Allergen Reactions  . Amoxicillin Other (See Comments)    REACTION: yeast infection in mouth Has patient had a PCN reaction causing immediate rash, facial/tongue/throat swelling, SOB or lightheadedness with hypotension: No Has patient had a PCN reaction causing severe rash involving mucus membranes or skin necrosis: No Has patient had a PCN  reaction that required hospitalization no Has patient had a PCN reaction occurring within the last 10 years: Yes If all of the above answers are "NO", then may proceed with Cephalosporin use.   Current Outpatient Prescriptions on File Prior to Visit  Medication Sig Dispense Refill  . amphetamine-dextroamphetamine (ADDERALL) 10 MG tablet TK 1 T PO UP TO 5 TIMES D  0  . FLUoxetine (PROZAC) 20 MG capsule Take 20 mg by mouth daily.     No current facility-administered medications on file prior to visit.    Review of Systems Review of Systems  Constitutional: Negative for fever, appetite change,  and unexpected weight change. pos for fatigue Eyes: Negative for pain and visual disturbance.  Respiratory: Negative for cough and shortness of breath.   Cardiovascular: Negative for cp or palpitations    Gastrointestinal: Negative for nausea, diarrhea and constipation.  Genitourinary: Negative for urgency and frequency.  Skin: Negative for pallor or rash   Neurological: Negative for weakness, light-headedness, numbness and headaches.  Hematological: Negative for adenopathy. Does not bruise/bleed easily.  Psychiatric/Behavioral: pos for dysphoric mood. Neg for SI The patient is not nervous/anxious.         Objective:   Physical Exam  Constitutional: She appears well-developed and well-nourished. No distress.  overwt and well appearing   HENT:  Head: Normocephalic and atraumatic.  Mouth/Throat: Oropharynx is clear and moist.  Eyes: Conjunctivae and EOM are normal. Pupils are equal, round, and reactive to light.  Neck: Normal range of motion. Neck supple. No JVD present. Carotid bruit is not present. No thyromegaly present.  Cardiovascular: Normal rate, regular rhythm, normal heart sounds and intact distal pulses.  Exam reveals no gallop.   Pulmonary/Chest: Effort normal and breath sounds normal. No respiratory distress. She has no wheezes. She has no rales.  No crackles  Abdominal: Soft.  Bowel sounds are normal. She exhibits no distension, no abdominal bruit and no mass. There is no tenderness.  Musculoskeletal: She exhibits no edema.  Lymphadenopathy:    She has no cervical adenopathy.  Neurological: She is alert. She has normal reflexes. No cranial nerve deficit. She exhibits normal muscle tone. Coordination normal.  No tremor   Skin: Skin is warm and dry. No rash noted.  Psychiatric:  Affect is somewhat flat          Assessment & Plan:   Problem List Items Addressed This Visit      Cardiovascular and Mediastinum   Essential hypertension - Primary  Much improved with hctz and tolerating it well Labs today  Disc DASH diet and smoking cessation and exercise when able F/u 6 mo       Relevant Medications   hydrochlorothiazide (HYDRODIURIL) 25 MG tablet   Other Relevant Orders   CBC with Differential/Platelet   Comprehensive metabolic panel   TSH   Lipid panel     Other   TOBACCO USE    Disc in detail risks of smoking and possible outcomes including copd, vascular/ heart disease, cancer , respiratory and sinus infections  Pt voices understanding Pt is not ready to quit yet      Post partum depression    Currently on prozac -has worked well so far but pt had recent deaths in the family and feels more down She is considering req inc in dose -not sure yet We will increase it if she desires- told her to call Reviewed stressors/ coping techniques/symptoms/ support sources/ tx options and side effects in detail today No SI

## 2016-02-11 NOTE — Patient Instructions (Signed)
Stay on the hctz -it is working  Drink more water  Get exercise when you can  Keep thinking about quitting smoking Try to avoid excess sodium in your diet  Labs today  Call and let me know if you want to increase your prozac dose in the future

## 2016-02-12 ENCOUNTER — Telehealth: Payer: Self-pay | Admitting: *Deleted

## 2016-02-12 LAB — CBC WITH DIFFERENTIAL/PLATELET
BASOS ABS: 0.1 10*3/uL (ref 0.0–0.1)
Basophils Relative: 0.5 % (ref 0.0–3.0)
EOS ABS: 0.1 10*3/uL (ref 0.0–0.7)
EOS PCT: 0.7 % (ref 0.0–5.0)
HEMATOCRIT: 43.4 % (ref 36.0–46.0)
Hemoglobin: 14.2 g/dL (ref 12.0–15.0)
LYMPHS ABS: 5.1 10*3/uL — AB (ref 0.7–4.0)
Lymphocytes Relative: 25.6 % (ref 12.0–46.0)
MCHC: 32.7 g/dL (ref 30.0–36.0)
MCV: 76.5 fl — ABNORMAL LOW (ref 78.0–100.0)
MONO ABS: 1 10*3/uL (ref 0.1–1.0)
Monocytes Relative: 5 % (ref 3.0–12.0)
NEUTROS PCT: 68.2 % (ref 43.0–77.0)
Neutro Abs: 13.7 10*3/uL — ABNORMAL HIGH (ref 1.4–7.7)
PLATELETS: 335 10*3/uL (ref 150.0–400.0)
RBC: 5.67 Mil/uL — AB (ref 3.87–5.11)
RDW: 17.5 % — AB (ref 11.5–15.5)

## 2016-02-12 LAB — COMPREHENSIVE METABOLIC PANEL
ALT: 43 U/L — AB (ref 0–35)
AST: 27 U/L (ref 0–37)
Albumin: 4.3 g/dL (ref 3.5–5.2)
Alkaline Phosphatase: 126 U/L — ABNORMAL HIGH (ref 39–117)
BILIRUBIN TOTAL: 0.3 mg/dL (ref 0.2–1.2)
BUN: 10 mg/dL (ref 6–23)
CALCIUM: 10.2 mg/dL (ref 8.4–10.5)
CHLORIDE: 102 meq/L (ref 96–112)
CO2: 27 meq/L (ref 19–32)
CREATININE: 0.84 mg/dL (ref 0.40–1.20)
GFR: 85.28 mL/min (ref >60.00)
GLUCOSE: 71 mg/dL (ref 70–99)
Potassium: 4.3 mEq/L (ref 3.5–5.1)
Sodium: 137 mEq/L (ref 135–145)
Total Protein: 7.6 g/dL (ref 6.0–8.3)

## 2016-02-12 LAB — LIPID PANEL
CHOLESTEROL: 238 mg/dL — AB (ref 0–200)
HDL: 29.2 mg/dL — ABNORMAL LOW (ref >39.00)
NonHDL: 209.29
Total CHOL/HDL Ratio: 8
Triglycerides: 377 mg/dL — ABNORMAL HIGH (ref 0.0–149.0)
VLDL: 75.4 mg/dL — AB (ref 0.0–40.0)

## 2016-02-12 LAB — TSH: TSH: 4.32 u[IU]/mL (ref 0.35–4.50)

## 2016-02-12 LAB — LDL CHOLESTEROL, DIRECT: Direct LDL: 168 mg/dL

## 2016-02-12 NOTE — Telephone Encounter (Signed)
Received call from Santiago Glad at [facility] drawn on pt last night and sent this am had a critical WBC of 20.1. Results not in Epic yet, but will be shortly.

## 2016-02-12 NOTE — Assessment & Plan Note (Signed)
Currently on prozac -has worked well so far but pt had recent deaths in the family and feels more down She is considering req inc in dose -not sure yet We will increase it if she desires- told her to call Reviewed stressors/ coping techniques/symptoms/ support sources/ tx options and side effects in detail today No SI

## 2016-02-12 NOTE — Telephone Encounter (Signed)
I will be on the look out for it, thanks  It was elevated through her pregnancy as well -from previous review

## 2016-02-12 NOTE — Assessment & Plan Note (Signed)
Disc in detail risks of smoking and possible outcomes including copd, vascular/ heart disease, cancer , respiratory and sinus infections  Pt voices understanding Pt is not ready to quit yet  

## 2016-02-12 NOTE — Assessment & Plan Note (Signed)
Much improved with hctz and tolerating it well Labs today  Disc DASH diet and smoking cessation and exercise when able F/u 6 mo

## 2016-02-20 ENCOUNTER — Ambulatory Visit (INDEPENDENT_AMBULATORY_CARE_PROVIDER_SITE_OTHER)
Admission: RE | Admit: 2016-02-20 | Discharge: 2016-02-20 | Disposition: A | Discharge status: Home / Self Care | Payer: Managed Care, Other (non HMO) | Source: Ambulatory Visit | Attending: Family Medicine | Admitting: Family Medicine

## 2016-02-20 ENCOUNTER — Telehealth: Payer: Self-pay | Admitting: Family Medicine

## 2016-02-20 ENCOUNTER — Ambulatory Visit (INDEPENDENT_AMBULATORY_CARE_PROVIDER_SITE_OTHER): Payer: Managed Care, Other (non HMO) | Admitting: Family Medicine

## 2016-02-20 ENCOUNTER — Encounter: Payer: Self-pay | Admitting: Family Medicine

## 2016-02-20 VITALS — BP 126/72 | HR 94 | Temp 98.4°F | Ht 66.5 in | Wt 187.2 lb

## 2016-02-20 DIAGNOSIS — R748 Abnormal levels of other serum enzymes: Secondary | ICD-10-CM

## 2016-02-20 DIAGNOSIS — D72829 Elevated white blood cell count, unspecified: Secondary | ICD-10-CM | POA: Diagnosis not present

## 2016-02-20 DIAGNOSIS — R740 Nonspecific elevation of levels of transaminase and lactic acid dehydrogenase [LDH]: Secondary | ICD-10-CM

## 2016-02-20 DIAGNOSIS — R7401 Elevation of levels of liver transaminase levels: Secondary | ICD-10-CM | POA: Insufficient documentation

## 2016-02-20 DIAGNOSIS — R739 Hyperglycemia, unspecified: Secondary | ICD-10-CM | POA: Insufficient documentation

## 2016-02-20 DIAGNOSIS — E785 Hyperlipidemia, unspecified: Secondary | ICD-10-CM | POA: Diagnosis not present

## 2016-02-20 DIAGNOSIS — R9389 Abnormal findings on diagnostic imaging of other specified body structures: Secondary | ICD-10-CM | POA: Insufficient documentation

## 2016-02-20 NOTE — Progress Notes (Signed)
Pre visit review using our clinic review tool, if applicable. No additional management support is needed unless otherwise documented below in the visit note. 

## 2016-02-20 NOTE — Telephone Encounter (Signed)
-----   Message from Tammi Sou, [place] sent at 02/20/2016  3:29 PM EDT ----- Pt notified of xray results and Dr. Marliss Coots comments. Pt said she does not have preference to either [facility] or [facility], she said which ever place has the 1st available appt is fine with her. Please put order in and I advise pt our [facility] will call and schedule appt

## 2016-02-20 NOTE — Progress Notes (Signed)
Subjective:    Patient ID: Michele Rojas, female    DOB: 1986/04/17, 30 y.o.   MRN: [id number]  HPI Here for f/u of lab report  Leukocytosis  Looks like her wbc was mildly elevated in the past and through her pregnancy  Lab Results  Component Value Date   WBC 20.1 Repeated and verified X2.* 02/11/2016   HGB 14.2 02/11/2016   HCT 43.4 02/11/2016   MCV 76.5* 02/11/2016   PLT 335.0 02/11/2016   neut # is 13.7  Lymphocytes slt high at 13.7 No steroids  Pt states it was high during her pregnancy- this is higher  Since pregnancy- no chills but more sweating (sweating more easily)   Had a great uncle who died from acute leukemia   No urinary symptoms-no frequency or dysuria/ no blood in urine (does have irreg uterine bleeding however)- has an IUD   No respiratory symptoms No cough or cold symptoms  Has had a remote hx of mono and mrsa in the past  Also has had several bartholin cysts (not inflammed)   She is having trouble with teeth  No known abscesses Has a broken  Needs to get to the dentist   No rash  No tick bites   Feels tired since delivery   Cholesterol is high Lab Results  Component Value Date   CHOL 238* 02/11/2016   HDL 29.20* 02/11/2016   LDLDIRECT 168.0 02/11/2016   TRIG 377.0* 02/11/2016   CHOLHDL 8 02/11/2016   family hx of low HDL  Eats a fair amount of sat fat   Liver Lab Results  Component Value Date   ALT 43* 02/11/2016   AST 27 02/11/2016   ALKPHOS 126* 02/11/2016   BILITOT 0.3 02/11/2016   no tylenol or alcohol   Patient Active Problem List   Diagnosis Date Noted  . Hyperlipidemia 02/22/2016  . Abnormal transaminases 02/20/2016  . Leukocytosis 02/20/2016  . Hyperglycemia 02/20/2016  . Abnormal chest x-ray 02/20/2016  . 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 Essential hypertension 01/07/2016  . Placental abruption 09/23/2015  . Pre-eclampsia 06/09/2014  . Preeclampsia 05/28/2014  . Encounter for biometric screening 09/30/2013    . Screening for lipoid disorders 09/28/2013  . Diabetes mellitus screening 09/28/2013  . Sore throat 11/15/2011  . Hypothyroid 11/04/2011  . Depression 10/04/2011  . RLQ PAIN 03/20/2010  . HEADACHE 11/18/2009  . MOOD SWINGS 11/17/2009  . TRANSAMINASES, SERUM, ELEVATED 10/13/2009  . FATIGUE 10/06/2009  . TOBACCO USE 02/19/2009  . CHICKENPOX, HX OF 02/19/2009  . DEFICIENCY OF OTHER VITAMINS 04/29/2008  . OTHER CONSTIPATION 04/29/2008   Past Medical History  Diagnosis Date  . Tobacco abuse   . Fatigue   . Constipation   . History of chicken pox as a child  . Headache     migraines  . Depression   . Hypothyroidism     no meds  . Preeclampsia     2015 & 2017 pregnancies  . Mood swings ([place])   . Vitamin deficiency   . History of preterm delivery     2015- 31wks, 2017- 35wks  . HPV (human papilloma virus) infection   . Hx of abnormal cervical Pap smear     ASCUS, LGSIL, CIN I, [place]  . ADD (attention deficit disorder)     no meds  . Smoker    Past Surgical History  Procedure Laterality Date  . Cholecystectomy, laparoscopic    . Wisdom tooth extraction    . Achilles tendon  surgery    . Bartholin gland cyst excision    . Cesarean section N/A 06/09/2014    Procedure: CESAREAN SECTION;  Surgeon: Farrel Gobble. Harrington Challenger, MD;  Location: [facility];  Service: Obstetrics;  Laterality: N/A;  . Cesarean section N/A 09/22/2015    Procedure: CESAREAN SECTION;  Surgeon: Jerelyn Charles, MD;  Location: [facility];  Service: Obstetrics;  Laterality: N/A;   Social History  Substance Use Topics  . Smoking status: Current Every Day Smoker -- 1.00 packs/day    Types: Cigarettes    Last Attempt to Quit: 05/09/2010  . Smokeless tobacco: Never Used  . Alcohol Use: No     Comment: socially   Family History  Problem Relation Age of Onset  . Depression Mother     after a car accident  . Hypertension Father   . Hyperlipidemia Father   . Aneurysm Maternal Grandfather   . Alzheimer's disease Other   .  Depression Brother     major depression  . Graves' disease Paternal Uncle    Allergies  Allergen Reactions  . Amoxicillin Other (See Comments)    REACTION: yeast infection in mouth Has patient had a PCN reaction causing immediate rash, facial/tongue/throat swelling, SOB or lightheadedness with hypotension: No Has patient had a PCN reaction causing severe rash involving mucus membranes or skin necrosis: No Has patient had a PCN reaction that required hospitalization no Has patient had a PCN reaction occurring within the last 10 years: Yes If all of the above answers are "NO", then may proceed with Cephalosporin use.   Current Outpatient Prescriptions on File Prior to Visit  Medication Sig Dispense Refill  . amphetamine-dextroamphetamine (ADDERALL) 10 MG tablet TK 1 T PO UP TO 5 TIMES D  0  . FLUoxetine (PROZAC) 20 MG capsule Take 20 mg by mouth daily.    . hydrochlorothiazide (HYDRODIURIL) 25 MG tablet Take 1 tablet (25 mg total) by mouth daily. 30 tablet 11  . levonorgestrel (MIRENA) 20 MCG/24HR IUD 1 each by Intrauterine route once.     No current facility-administered medications on file prior to visit.    Review of Systems    Review of Systems  Constitutional: Negative for fever, appetite change,  and unexpected weight change.  Eyes: Negative for pain and visual disturbance.  Respiratory: Negative for cough and shortness of breath.   Cardiovascular: Negative for cp or palpitations    Gastrointestinal: Negative for nausea, diarrhea and constipation.  Genitourinary: Negative for urgency and frequency.  Skin: Negative for pallor or rash   Neurological: Negative for weakness, light-headedness, numbness and headaches.  Hematological: Negative for adenopathy. Does not bruise/bleed easily.  Psychiatric/Behavioral: Negative for dysphoric mood. The patient is not nervous/anxious.      Objective:   Physical Exam  Constitutional: She appears well-developed and well-nourished. No  distress.  Well appearing/ fatigued  HENT:  Head: Normocephalic and atraumatic.  Right Ear: External ear normal.  Left Ear: External ear normal.  Nose: Nose normal.  Mouth/Throat: Oropharynx is clear and moist.  Dentition is sub optimal No signs of abscess currently  Eyes: Conjunctivae and EOM are normal. Pupils are equal, round, and reactive to light. Right eye exhibits no discharge. Left eye exhibits no discharge. No scleral icterus.  Neck: Normal range of motion. Neck supple. No JVD present. Carotid bruit is not present. No thyromegaly present.  Cardiovascular: Normal rate, regular rhythm, normal heart sounds and intact distal pulses.  Exam reveals no gallop.   Pulmonary/Chest: Effort normal and breath sounds  normal. No respiratory distress. She has no wheezes. She has no rales. She exhibits no tenderness.  Abdominal: Soft. Bowel sounds are normal. She exhibits no distension and no mass. There is no tenderness. There is no rebound and no guarding.  Musculoskeletal: She exhibits no edema or tenderness.  Lymphadenopathy:    She has no cervical adenopathy.  Neurological: She is alert. She has normal reflexes. No cranial nerve deficit. She exhibits normal muscle tone. Coordination normal.  Skin: Skin is warm and dry. No rash noted. No erythema. No pallor.  Psychiatric: She has a normal mood and affect.  Here with her mother who is supportive          Assessment & Plan:   Problem List Items Addressed This Visit      Other   Leukocytosis    Wbc has been going up (primarily neutrophils)- esp during pregnancy It has not come down  No symptoms of infection cxr and urine cx today  Urged her to get to a dentist regarding broken tooth and cavities Unremarkable exam  If all nl will ref to hematologist       Relevant Orders   DG Chest 2 View (Completed)   Urine culture (Completed)   Hyperlipidemia    Disc goals for lipids and reasons to control them Rev labs with pt Rev low sat  fat diet in detail Disc need for lower fat diet to lower LDL and trig  Exercise when able to raise HDL  Re check planned in 3 mo       Abnormal transaminases - Primary    This is mild  Will continue to watch Counseled against use of tylenol and alcohol Thinks she was screened for hepatitis with her pregnancies Is overwt-disc poss of fatty liver-will work on low fat diet

## 2016-02-20 NOTE — Telephone Encounter (Signed)
I will order it and route to [facility]

## 2016-02-20 NOTE — Telephone Encounter (Signed)
Appt made on 02/26/16 at 4pm at [place].

## 2016-02-20 NOTE — Patient Instructions (Addendum)
Do get an appointment with a dentist  For cholesterol -watch sat fat in diet Avoid red meat/ fried foods/ egg yolks/ fatty breakfast meats/ butter, cheese and high fat dairy/ and shellfish   We will check cholesterol again in 3 months   For elevated white blood cell count - urine test and chest xray today  If all normal- we will refer you to a hematologist Keep thinking about quitting smoking  Try to take care of yourself

## 2016-02-21 LAB — URINE CULTURE
Colony Count: NO GROWTH
ORGANISM ID, BACTERIA: NO GROWTH

## 2016-02-22 DIAGNOSIS — E785 Hyperlipidemia, unspecified: Secondary | ICD-10-CM | POA: Insufficient documentation

## 2016-02-22 NOTE — Assessment & Plan Note (Signed)
Disc goals for lipids and reasons to control them Rev labs with pt Rev low sat fat diet in detail Disc need for lower fat diet to lower LDL and trig  Exercise when able to raise HDL  Re check planned in 3 mo

## 2016-02-22 NOTE — Assessment & Plan Note (Signed)
This is mild  Will continue to watch Counseled against use of tylenol and alcohol Thinks she was screened for hepatitis with her pregnancies Is overwt-disc poss of fatty liver-will work on low fat diet

## 2016-02-22 NOTE — Assessment & Plan Note (Addendum)
Wbc has been going up (primarily neutrophils)- esp during pregnancy It has not come down  No symptoms of infection cxr and urine cx today  Urged her to get to a dentist regarding broken tooth and cavities Unremarkable exam  If all nl will ref to hematologist

## 2016-02-24 ENCOUNTER — Ambulatory Visit: Payer: Managed Care, Other (non HMO) | Admitting: Family Medicine

## 2016-02-26 ENCOUNTER — Ambulatory Visit (INDEPENDENT_AMBULATORY_CARE_PROVIDER_SITE_OTHER)
Admission: RE | Admit: 2016-02-26 | Discharge: 2016-02-26 | Disposition: A | Discharge status: Home / Self Care | Payer: Managed Care, Other (non HMO) | Source: Ambulatory Visit | Attending: Family Medicine | Admitting: Family Medicine

## 2016-02-26 DIAGNOSIS — R938 Abnormal findings on diagnostic imaging of other specified body structures: Secondary | ICD-10-CM

## 2016-02-26 DIAGNOSIS — R9389 Abnormal findings on diagnostic imaging of other specified body structures: Secondary | ICD-10-CM

## 2016-02-27 ENCOUNTER — Telehealth: Payer: Self-pay | Admitting: Family Medicine

## 2016-02-27 DIAGNOSIS — D72829 Elevated white blood cell count, unspecified: Secondary | ICD-10-CM

## 2016-02-27 NOTE — Telephone Encounter (Signed)
Ref done Will route to [place] 

## 2016-02-27 NOTE — Telephone Encounter (Signed)
I already resulted on mychart and also sent a result note to call her It was normal  I want to go ahead and ref to hematology- if she is agreeable - please ask [place] or [place]  thanks

## 2016-02-27 NOTE — Telephone Encounter (Signed)
Pt called requesting a cb regarding CT scan results. Please call when available.

## 2016-02-27 NOTE — Telephone Encounter (Signed)
Pt notified of CT results and Dr. Marliss Coots comments. Pt agrees to see a hematologist, pt doesn't have a preference of either [place] or [place] pt said she just wants to be see by a hematologist Dr. Glori Bickers recommends. Copy of CT results mailed to pt

## 2016-03-05 ENCOUNTER — Encounter: Payer: Self-pay | Admitting: Hematology and Oncology

## 2016-03-05 NOTE — Telephone Encounter (Signed)
Appointment scheduled with [facility] on 8/14 at 1pm. Patient agreed to the date and time. Letter mailed to the patient and telephone call to the referring.

## 2016-03-08 ENCOUNTER — Encounter: Payer: Self-pay | Admitting: Family Medicine

## 2016-03-08 ENCOUNTER — Ambulatory Visit (INDEPENDENT_AMBULATORY_CARE_PROVIDER_SITE_OTHER): Payer: Managed Care, Other (non HMO) | Admitting: Family Medicine

## 2016-03-08 VITALS — BP 117/78 | HR 90 | Temp 98.6°F | Ht 66.5 in | Wt 187.0 lb

## 2016-03-08 DIAGNOSIS — D1720 Benign lipomatous neoplasm of skin and subcutaneous tissue of unspecified limb: Secondary | ICD-10-CM | POA: Diagnosis not present

## 2016-03-08 DIAGNOSIS — D72829 Elevated white blood cell count, unspecified: Secondary | ICD-10-CM | POA: Diagnosis not present

## 2016-03-08 DIAGNOSIS — N898 Other specified noninflammatory disorders of vagina: Secondary | ICD-10-CM | POA: Insufficient documentation

## 2016-03-08 NOTE — Progress Notes (Signed)
   Subjective:    Patient ID: Michele Rojas, female    DOB: 10/11/1985, 30 y.o.   MRN: [id number]  HPI Here to check a lump on the left thigh she discovered while shaving her legs this past weekend. It is not tender. She is concerned that it may be related to her elevated WBC counts. She sees Dr. Glori Bickers for primary care and they have been following this since her pregnancy. She delivered by C section about 6 months ago. In that time the total WBC count has risen from 18 to 20.1 and there is slight neutrophilia. She has had no signs of infection to explain this, although she does admit to having intermittent lower pelvic cramps, lower back cramps, and frequent vaginal DC that is sometimes brown and sometimes red in color. This often has a foul odor. She has assumed it to be vaginal bleeding, but she she had her 3 months postpartem exam with Dr. Deatra Ina she was told everything looked okay.    Review of Systems  Constitutional: Negative.   Respiratory: Negative.   Cardiovascular: Negative.   Gastrointestinal: Negative.   Genitourinary: Positive for pelvic pain, vaginal bleeding and vaginal discharge. Negative for dysuria, flank pain, frequency, hematuria and urgency.  Skin: Negative.   Neurological: Negative.        Objective:   Physical Exam  Constitutional: She is oriented to person, place, and time. She appears well-developed and well-nourished. No distress.  Neck: Neck supple. No thyromegaly present.  Cardiovascular: Normal rate, regular rhythm, normal heart sounds and intact distal pulses.   Pulmonary/Chest: Effort normal and breath sounds normal.  Lymphadenopathy:    She has no cervical adenopathy.  Neurological: She is alert and oriented to person, place, and time.  Skin: No rash noted.  There is a small firm mobile non-tender lump just under the skin in the anterior left thigh          Assessment & Plan:  I reassured her that the lump on her thigh is a benign lipoma and  nothing to worry about. This is not related to the leukocytosis however. Her pelvic symptoms are suspicious for bacterial vaginitis, and something like this could potentially cause a leukocytosis. I suggested she see her GYN office ASAP for a pelvic exam and evaluation. She is scheduled to see Hematology on 03-22-16.  Laurey Morale, MD

## 2016-03-08 NOTE — Progress Notes (Signed)
Pre visit review using our clinic review tool, if applicable. No additional management support is needed unless otherwise documented below in the visit note. 

## 2016-03-15 ENCOUNTER — Ambulatory Visit (INDEPENDENT_AMBULATORY_CARE_PROVIDER_SITE_OTHER): Payer: Managed Care, Other (non HMO) | Admitting: Family Medicine

## 2016-03-15 ENCOUNTER — Encounter: Payer: Self-pay | Admitting: Family Medicine

## 2016-03-15 VITALS — BP 124/68 | HR 92 | Temp 98.1°F | Ht 66.5 in | Wt 189.5 lb

## 2016-03-15 DIAGNOSIS — R1012 Left upper quadrant pain: Secondary | ICD-10-CM

## 2016-03-15 DIAGNOSIS — R109 Unspecified abdominal pain: Secondary | ICD-10-CM | POA: Insufficient documentation

## 2016-03-15 LAB — POC URINALSYSI DIPSTICK (AUTOMATED)
Bilirubin, UA: NEGATIVE
Glucose, UA: NEGATIVE
Ketones, UA: NEGATIVE
Leukocytes, UA: NEGATIVE
Nitrite, UA: NEGATIVE
PROTEIN UA: NEGATIVE
RBC UA: NEGATIVE
SPEC GRAV UA: 1.025
UROBILINOGEN UA: 0.2
pH, UA: 6

## 2016-03-15 NOTE — Assessment & Plan Note (Signed)
Sometimes radiating to back or flank Most tender in LUQ under ribs  Hx of elevated wbc-will get [place] of abd/ want to vis spleen ua neg Will also have her try zantac 150 bid - in case of gastritis and watch diet  Update if symptoms worsen  Will check in when [place] report returns

## 2016-03-15 NOTE — Patient Instructions (Signed)
Get zantac over the counter 150 mg - 1 pill twice daily for acid in stomach- let's see if that helps indigestion or abdominal pain  Stop at check out to get referral for abdominal ultrasound  If symptoms suddenly worsen let me know

## 2016-03-15 NOTE — Progress Notes (Signed)
Pre visit review using our clinic review tool, if applicable. No additional management support is needed unless otherwise documented below in the visit note. 

## 2016-03-15 NOTE — Progress Notes (Signed)
Subjective:    Patient ID: Michele Rojas, female    DOB: Jul 30, 1986, 30 y.o.   MRN: [id number]  HPI  Here with a week of LUQ and flank pain   Here with some pain - in L upper abdomen and side - will shoot up into her shoulder area  Feels bloated  Pain is up under ribs Tender to the touch and it shoots pain backwards   Had ccy in the past  No R sided pain   occ nauseated for a week  No vomiting  No fever  Some constipation  No change in diet  No blood in stool or change in color   In general tired and some achiness all over   Elevated wbc and first appt with heme in a week   Wt Readings from Last 3 Encounters:  03/15/16 189 lb 8 oz (86 kg)  03/08/16 187 lb (84.8 kg)  02/20/16 187 lb 4 oz (84.9 kg)      Whole stomach feels like something is wrong  Some pelvic cramping  Ever since birth in feb Has mirena for 6 wk Nl [place]   Drinking lots of water - more lately  At times does not feel like she empties her bladder  No burning or frequ/urgency  Does not feel like she has a uti  No hx of renal stones     Chemistry      Component Value Date/Time   NA 137 02/11/2016 1841   K 4.3 02/11/2016 1841   CL 102 02/11/2016 1841   CO2 27 02/11/2016 1841   BUN 10 02/11/2016 1841   CREATININE 0.84 02/11/2016 1841      Component Value Date/Time   CALCIUM 10.2 02/11/2016 1841   ALKPHOS 126 (H) 02/11/2016 1841   AST 27 02/11/2016 1841   ALT 43 (H) 02/11/2016 1841   BILITOT 0.3 02/11/2016 1841      Lab Results  Component Value Date   WBC 20.1 Repeated and verified X2. (HH) 02/11/2016   HGB 14.2 02/11/2016   HCT 43.4 02/11/2016   MCV 76.5 (L) 02/11/2016   PLT 335.0 02/11/2016     UA is completely clear today  Results for orders placed or performed in visit on 03/15/16  POCT Urinalysis Dipstick (Automated)  Result Value Ref Range   Color, UA Yellow    Clarity, UA Clear    Glucose, UA Negative    Bilirubin, UA Negative    Ketones, UA Negative    Spec Grav, UA  1.025    Blood, UA Negative    pH, UA 6.0    Protein, UA Negative    Urobilinogen, UA 0.2    Nitrite, UA Negative    Leukocytes, UA Negative Negative      Patient Active Problem List   Diagnosis Date Noted  . LUQ abdominal pain 03/15/2016  . Acute left flank pain 03/15/2016  . Vaginal discharge 03/08/2016  . Lipoma of lower extremity 03/08/2016  . Hyperlipidemia 02/22/2016  . Abnormal transaminases 02/20/2016  . Leukocytosis 02/20/2016  . Hyperglycemia 02/20/2016  . Abnormal chest x-ray 02/20/2016  . 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 Essential hypertension 01/07/2016  . Placental abruption 09/23/2015  . Pre-eclampsia 06/09/2014  . Preeclampsia 05/28/2014  . Encounter for biometric screening 09/30/2013  . Screening for lipoid disorders 09/28/2013  . Diabetes mellitus screening 09/28/2013  . Sore throat 11/15/2011  . Hypothyroid 11/04/2011  . Depression 10/04/2011  . RLQ PAIN 03/20/2010  . HEADACHE 11/18/2009  .  MOOD SWINGS 11/17/2009  . TRANSAMINASES, SERUM, ELEVATED 10/13/2009  . FATIGUE 10/06/2009  . TOBACCO USE 02/19/2009  . CHICKENPOX, HX OF 02/19/2009  . DEFICIENCY OF OTHER VITAMINS 04/29/2008  . OTHER CONSTIPATION 04/29/2008   Past Medical History:  Diagnosis Date  . ADD (attention deficit disorder)    no meds  . Constipation   . Depression   . Fatigue   . Headache    migraines  . History of chicken pox as a child  . History of preterm delivery    2015- 31wks, 2017- 35wks  . HPV (human papilloma virus) infection   . Hx of abnormal cervical Pap smear    ASCUS, LGSIL, CIN I, Colpo  . Hypothyroidism    no meds  . Mood swings ([place])   . Preeclampsia    2015 & 2017 pregnancies  . Smoker   . Tobacco abuse   . Vitamin deficiency    Past Surgical History:  Procedure Laterality Date  . ACHILLES TENDON SURGERY    . BARTHOLIN GLAND CYST EXCISION    . CESAREAN SECTION N/A 06/09/2014   Procedure: CESAREAN SECTION;  Surgeon: Farrel Gobble. Harrington Challenger, MD;   Location: [facility];  Service: Obstetrics;  Laterality: N/A;  . CESAREAN SECTION N/A 09/22/2015   Procedure: CESAREAN SECTION;  Surgeon: Jerelyn Charles, MD;  Location: [facility];  Service: Obstetrics;  Laterality: N/A;  . CHOLECYSTECTOMY, LAPAROSCOPIC    . WISDOM TOOTH EXTRACTION     Social History  Substance Use Topics  . Smoking status: Current Every Day Smoker    Packs/day: 1.00    Types: Cigarettes    Last attempt to quit: 05/09/2010  . Smokeless tobacco: Never Used  . Alcohol use No     Comment: socially   Family History  Problem Relation Age of Onset  . Depression Mother     after a car accident  . Hypertension Father   . Hyperlipidemia Father   . Aneurysm Maternal Grandfather   . Alzheimer's disease Other   . Depression Brother     major depression  . Graves' disease Paternal Uncle    Allergies  Allergen Reactions  . Amoxicillin Other (See Comments)    REACTION: yeast infection in mouth Has patient had a PCN reaction causing immediate rash, facial/tongue/throat swelling, SOB or lightheadedness with hypotension: No Has patient had a PCN reaction causing severe rash involving mucus membranes or skin necrosis: No Has patient had a PCN reaction that required hospitalization no Has patient had a PCN reaction occurring within the last 10 years: Yes If all of the above answers are "NO", then may proceed with Cephalosporin use.   Current Outpatient Prescriptions on File Prior to Visit  Medication Sig Dispense Refill  . amphetamine-dextroamphetamine (ADDERALL) 10 MG tablet TK 1 T PO UP TO 5 TIMES D  0  . FLUoxetine (PROZAC) 20 MG capsule Take 20 mg by mouth daily.    . hydrochlorothiazide (HYDRODIURIL) 25 MG tablet Take 1 tablet (25 mg total) by mouth daily. 30 tablet 11  . levonorgestrel (MIRENA) 20 MCG/24HR IUD 1 each by Intrauterine route once.     No current facility-administered medications on file prior to visit.      Review of Systems Review of Systems  Constitutional:  Negative for fever, appetite change, and unexpected weight change. pos for fatigue   Neg for etoh intake  Eyes: Negative for pain and visual disturbance.  Respiratory: Negative for cough and shortness of breath.  pos for smoking  Cardiovascular: Negative  for cp or palpitations    Gastrointestinal: Negative for nausea, diarrhea and constipation. pos for LUQ pain and flank pain  Genitourinary: Negative for urgency and frequency. pos for irreg vaginal bleeding with mirena  Skin: Negative for pallor or rash   Neurological: Negative for weakness, light-headedness, numbness and headaches.  Hematological: Negative for adenopathy. Does not bruise/bleed easily.  Psychiatric/Behavioral: Negative for dysphoric mood. The patient is not nervous/anxious.         Objective:   Physical Exam  Constitutional: She appears well-developed and well-nourished. No distress.  overwt and well appearing   HENT:  Head: Normocephalic and atraumatic.  Mouth/Throat: Oropharynx is clear and moist.  Eyes: Conjunctivae and EOM are normal. Pupils are equal, round, and reactive to light. No scleral icterus.  Neck: Normal range of motion. Neck supple.  Cardiovascular: Normal rate, regular rhythm and normal heart sounds.   Pulmonary/Chest: Effort normal and breath sounds normal. No respiratory distress. She has no wheezes. She has no rales.  bs are slightly distant  Abdominal: Soft. Bowel sounds are normal. She exhibits no distension, no pulsatile liver, no abdominal bruit, no ascites, no pulsatile midline mass and no mass. There is no hepatosplenomegaly. There is tenderness in the left upper quadrant. There is no rigidity, no rebound, no guarding, no CVA tenderness, no tenderness at McBurney's point and negative Murphy's sign.  Lymphadenopathy:    She has no cervical adenopathy.  Neurological: She is alert.  Skin: Skin is warm and dry. No erythema. No pallor.  Psychiatric: She has a normal mood and affect.            Assessment & Plan:   Problem List Items Addressed This Visit      Other   LUQ abdominal pain    Sometimes radiating to back or flank Most tender in LUQ under ribs  Hx of elevated wbc-will get [place] of abd/ want to vis spleen ua neg Will also have her try zantac 150 bid - in case of gastritis and watch diet  Update if symptoms worsen  Will check in when [place] report returns       Relevant Orders   US Abdomen Complete   Acute left flank pain    ua neg [place] ordered -to vis spleen/aorta/kidneys        Relevant Orders   POCT Urinalysis Dipstick (Automated) (Completed)    Other Visit Diagnoses   None.

## 2016-03-15 NOTE — Assessment & Plan Note (Signed)
ua neg [place] ordered -to vis spleen/aorta/kidneys

## 2016-03-16 ENCOUNTER — Ambulatory Visit
Admission: RE | Admit: 2016-03-16 | Discharge: 2016-03-16 | Disposition: A | Discharge status: Home / Self Care | Payer: Managed Care, Other (non HMO) | Source: Ambulatory Visit | Attending: Family Medicine | Admitting: Family Medicine

## 2016-03-16 DIAGNOSIS — R1012 Left upper quadrant pain: Secondary | ICD-10-CM | POA: Diagnosis present

## 2016-03-16 DIAGNOSIS — Z9049 Acquired absence of other specified parts of digestive tract: Secondary | ICD-10-CM | POA: Insufficient documentation

## 2016-03-17 ENCOUNTER — Encounter: Payer: Self-pay | Admitting: Family Medicine

## 2016-03-17 ENCOUNTER — Ambulatory Visit (INDEPENDENT_AMBULATORY_CARE_PROVIDER_SITE_OTHER): Payer: Managed Care, Other (non HMO) | Admitting: Family Medicine

## 2016-03-17 VITALS — BP 130/88 | HR 103 | Temp 97.7°F | Ht 66.5 in | Wt 190.8 lb

## 2016-03-17 DIAGNOSIS — L989 Disorder of the skin and subcutaneous tissue, unspecified: Secondary | ICD-10-CM

## 2016-03-17 DIAGNOSIS — R21 Rash and other nonspecific skin eruption: Secondary | ICD-10-CM

## 2016-03-17 DIAGNOSIS — F411 Generalized anxiety disorder: Secondary | ICD-10-CM | POA: Diagnosis not present

## 2016-03-17 DIAGNOSIS — R509 Fever, unspecified: Secondary | ICD-10-CM | POA: Diagnosis not present

## 2016-03-17 NOTE — Patient Instructions (Signed)
Please take over the counter acetaminophen (tylenol) or ibuprofen (advil) for aches, pains and fever. Please let me know if you are not better in 3-5 days or if you get worse.

## 2016-03-17 NOTE — Progress Notes (Signed)
Subjective:    Patient ID: Michele Rojas, female    DOB: 05/19/1986, 30 y.o.   MRN: [id number]  HPI This is a 30 yo female who presents today with 1 day of fever. She had a fever of 101.1 last night. Was achy last night, resolved today. Did not take any meds. Noticed some redness on her arms and legs since yesterday afternoon. Rash more prononunced with heat. No known tick exposure on herself or her children. No headaches. No worsening of abdominal pain. Last BM today and normal. No cough. No dysuria. Has an appointment next week with hematology to evaluate chronic leukocytosis. By her own admission, is "a little freaked out." Had abdominal US yesterday which was negative, had negative chest Xray. She has two small children and feels like she has not gotten back to her pre pregnancy self hormonally. Youngest child 42 months old.   Has noticed increased crusting and bleeding from mole on upper lip, right side. Mole has been present since childhood, she also has several other facial moles that she has noticed are changing colors. Mole feels like it is "loose."   Past Medical History:  Diagnosis Date  . ADD (attention deficit disorder)    no meds  . Constipation   . Depression   . Fatigue   . Headache    migraines  . History of chicken pox as a child  . History of preterm delivery    2015- 31wks, 2017- 35wks  . HPV (human papilloma virus) infection   . Hx of abnormal cervical Pap smear    ASCUS, LGSIL, CIN I, Colpo  . Hypothyroidism    no meds  . Mood swings ([place])   . Preeclampsia    2015 & 2017 pregnancies  . Smoker   . Tobacco abuse   . Vitamin deficiency    Past Surgical History:  Procedure Laterality Date  . ACHILLES TENDON SURGERY    . BARTHOLIN GLAND CYST EXCISION    . CESAREAN SECTION N/A 06/09/2014   Procedure: CESAREAN SECTION;  Surgeon: Farrel Gobble. Harrington Challenger, MD;  Location: [facility];  Service: Obstetrics;  Laterality: N/A;  . CESAREAN SECTION N/A 09/22/2015   Procedure: CESAREAN  SECTION;  Surgeon: Jerelyn Charles, MD;  Location: [facility];  Service: Obstetrics;  Laterality: N/A;  . CHOLECYSTECTOMY, LAPAROSCOPIC    . WISDOM TOOTH EXTRACTION     Family History  Problem Relation Age of Onset  . Hypertension Father   . Hyperlipidemia Father   . Aneurysm Maternal Grandfather   . Depression Mother     after a car accident  . Alzheimer's disease Other   . Depression Brother     major depression  . Graves' disease Paternal Uncle    Social History  Substance Use Topics  . Smoking status: Current Every Day Smoker    Packs/day: 1.00    Types: Cigarettes    Last attempt to quit: 05/09/2010  . Smokeless tobacco: Never Used  . Alcohol use No     Comment: socially      Review of Systems Per HPI    Objective:   Physical Exam  Constitutional: She is oriented to person, place, and time. She appears well-developed and well-nourished.  HENT:  Head: Normocephalic and atraumatic.  Right Ear: Tympanic membrane, external ear and ear canal normal.  Left Ear: Tympanic membrane, external ear and ear canal normal.  Nose: Nose normal.  Mouth/Throat: Oropharynx is clear and moist.  Eyes: Conjunctivae are normal. Right eye exhibits  no discharge. Left eye exhibits no discharge.  Neck: Normal range of motion. Neck supple.  Cardiovascular: Normal rate, regular rhythm and normal heart sounds.   Pulmonary/Chest: Effort normal and breath sounds normal.  Musculoskeletal: Normal range of motion. She exhibits no edema.  Lymphadenopathy:    She has no cervical adenopathy.  Neurological: She is alert and oriented to person, place, and time.  Skin: Skin is warm and dry. Rash (faint, lacy rash on extremities) noted.  Right side of upper lip with 3-4 mm, raised mole.  Left forehead with 3 mm, dark mole.   Psychiatric: Her behavior is normal. Judgment and thought content normal.  Appears mildly anxious.   Vitals reviewed.    BP 130/88 (BP Location: Left Arm, Patient Position: Sitting,  Cuff Size: Large)   Pulse (!) 103   Temp 97.7 F (36.5 C) (Oral)   Ht 5' 6.5" (1.689 m)   Wt 190 lb 12.8 oz (86.5 kg)   LMP 02/11/2016 Comment: still on cycle but light  SpO2 99%   BMI 30.33 kg/m  Wt Readings from Last 3 Encounters:  03/17/16 190 lb 12.8 oz (86.5 kg)  03/15/16 189 lb 8 oz (86 kg)  03/08/16 187 lb (84.8 kg)        Assessment & Plan:  1. Fever, unspecified - patient currently afebrile without medications - she is understandably anxious about persistent leukocytosis. Discussed results of recent chest ct and abdominal US and tried to reassure patient.  - suspect viral illness with accompanying rash  2. Rash and nonspecific skin eruption - suspect viral illness  3. Skin lesion of face - Ambulatory referral to Dermatology  4. Anxiety state - encouraged self care measures, stress relief measures - follow up PRN   Clarene Reamer, FNP-BC  [facility], [place]  03/19/2016 4:37 PM

## 2016-03-19 ENCOUNTER — Encounter: Payer: Self-pay | Admitting: Family Medicine

## 2016-03-22 ENCOUNTER — Ambulatory Visit (HOSPITAL_BASED_OUTPATIENT_CLINIC_OR_DEPARTMENT_OTHER): Payer: Managed Care, Other (non HMO) | Admitting: Hematology and Oncology

## 2016-03-22 ENCOUNTER — Ambulatory Visit (HOSPITAL_BASED_OUTPATIENT_CLINIC_OR_DEPARTMENT_OTHER): Payer: Managed Care, Other (non HMO)

## 2016-03-22 ENCOUNTER — Other Ambulatory Visit (HOSPITAL_COMMUNITY)
Admission: RE | Admit: 2016-03-22 | Discharge: 2016-03-22 | Disposition: A | Discharge status: Home / Self Care | Payer: Managed Care, Other (non HMO) | Source: Ambulatory Visit | Attending: Hematology and Oncology | Admitting: Hematology and Oncology

## 2016-03-22 ENCOUNTER — Encounter: Payer: Self-pay | Admitting: Hematology and Oncology

## 2016-03-22 ENCOUNTER — Telehealth: Payer: Self-pay | Admitting: Hematology and Oncology

## 2016-03-22 VITALS — BP 134/89 | HR 65 | Temp 98.8°F | Resp 17 | Ht 66.5 in | Wt 190.3 lb

## 2016-03-22 DIAGNOSIS — D72829 Elevated white blood cell count, unspecified: Secondary | ICD-10-CM | POA: Insufficient documentation

## 2016-03-22 DIAGNOSIS — R5382 Chronic fatigue, unspecified: Secondary | ICD-10-CM | POA: Diagnosis not present

## 2016-03-22 DIAGNOSIS — D72828 Other elevated white blood cell count: Secondary | ICD-10-CM

## 2016-03-22 LAB — CBC & DIFF AND RETIC
BASO%: 0.2 % (ref 0.0–2.0)
BASOS ABS: 0 10*3/uL (ref 0.0–0.1)
EOS ABS: 0.1 10*3/uL (ref 0.0–0.5)
EOS%: 0.7 % (ref 0.0–7.0)
HEMATOCRIT: 42.8 % (ref 34.8–46.6)
HEMOGLOBIN: 14 g/dL (ref 11.6–15.9)
Immature Retic Fract: 13.9 % — ABNORMAL HIGH (ref 1.60–10.00)
LYMPH#: 4.1 10*3/uL — AB (ref 0.9–3.3)
LYMPH%: 29.3 % (ref 14.0–49.7)
MCH: 25.2 pg (ref 25.1–34.0)
MCHC: 32.7 g/dL (ref 31.5–36.0)
MCV: 77.1 fL — ABNORMAL LOW (ref 79.5–101.0)
MONO#: 0.9 10*3/uL (ref 0.1–0.9)
MONO%: 6.4 % (ref 0.0–14.0)
NEUT%: 63.4 % (ref 38.4–76.8)
NEUTROS ABS: 8.9 10*3/uL — AB (ref 1.5–6.5)
Platelets: 256 10*3/uL (ref 145–400)
RBC: 5.55 10*6/uL — ABNORMAL HIGH (ref 3.70–5.45)
RDW: 16.5 % — AB (ref 11.2–14.5)
RETIC %: 1.6 % (ref 0.70–2.10)
RETIC CT ABS: 88.8 10*3/uL (ref 33.70–90.70)
WBC: 14.1 10*3/uL — AB (ref 3.9–10.3)

## 2016-03-22 LAB — COMPREHENSIVE METABOLIC PANEL
ALT: 50 U/L (ref 0–55)
AST: 28 U/L (ref 5–34)
Albumin: 3.5 g/dL (ref 3.5–5.0)
Alkaline Phosphatase: 137 U/L (ref 40–150)
Anion Gap: 10 mEq/L (ref 3–11)
BUN: 8.3 mg/dL (ref 7.0–26.0)
CALCIUM: 10 mg/dL (ref 8.4–10.4)
CHLORIDE: 99 meq/L (ref 98–109)
CO2: 29 mEq/L (ref 22–29)
Creatinine: 0.8 mg/dL (ref 0.6–1.1)
EGFR: >90 mL/min/{1.73_m2} (ref >90)
Glucose: 84 mg/dl (ref 70–140)
POTASSIUM: 3.8 meq/L (ref 3.5–5.1)
SODIUM: 138 meq/L (ref 136–145)
Total Bilirubin: 0.39 mg/dL (ref 0.20–1.20)
Total Protein: 7.8 g/dL (ref 6.4–8.3)

## 2016-03-22 LAB — CHCC SMEAR

## 2016-03-22 NOTE — Progress Notes (Signed)
[facility] NOTE  Patient Care Team: Abner Greenspan, MD as PCP - General (Family Medicine)  CHIEF COMPLAINTS/PURPOSE OF CONSULTATION:  Leucocytosis primarily neutrophilia  HISTORY OF PRESENTING ILLNESS:  Michele Rojas 30 y.o. female is here because of elevated white blood cell count. Patient has had elevated white blood cell count at least since 2011. It was very small increases but over time her white count has increased to 19-20,000. There are predominantly neutrophils. She was sent to me for further evaluation for the elevated white blood cell count. Patient reports that she is feeling tired chronically. She does not feel like she has any infections ongoing. She does complain of diffuse pains throughout her muscles. She is also noticed some reddish to purplish blotchiness on her extremities. She denies fevers or chills.  MEDICAL HISTORY:  Past Medical History:  Diagnosis Date  . ADD (attention deficit disorder)    no meds  . Constipation   . Depression   . Fatigue   . Headache    migraines  . History of chicken pox as a child  . History of preterm delivery    2015- 31wks, 2017- 35wks  . HPV (human papilloma virus) infection   . Hx of abnormal cervical Pap smear    ASCUS, LGSIL, CIN I, Colpo  . Hypothyroidism    no meds  . Mood swings ([place])   . Preeclampsia    2015 & 2017 pregnancies  . Smoker   . Tobacco abuse   . Vitamin deficiency     SURGICAL HISTORY: Past Surgical History:  Procedure Laterality Date  . ACHILLES TENDON SURGERY    . BARTHOLIN GLAND CYST EXCISION    . CESAREAN SECTION N/A 06/09/2014   Procedure: CESAREAN SECTION;  Surgeon: Farrel Gobble. Harrington Challenger, MD;  Location: [facility];  Service: Obstetrics;  Laterality: N/A;  . CESAREAN SECTION N/A 09/22/2015   Procedure: CESAREAN SECTION;  Surgeon: Jerelyn Charles, MD;  Location: [facility];  Service: Obstetrics;  Laterality: N/A;  . CHOLECYSTECTOMY, LAPAROSCOPIC    . WISDOM TOOTH EXTRACTION      SOCIAL  HISTORY: Social History   Social History  . Marital status: Married    Spouse name: N/A  . Number of children: N/A  . Years of education: N/A   Occupational History  . Not on file.   Social History Main Topics  . Smoking status: Current Every Day Smoker    Packs/day: 1.00    Types: Cigarettes    Last attempt to quit: 05/09/2010  . Smokeless tobacco: Never Used  . Alcohol use No     Comment: socially  . Drug use: No  . Sexual activity: Yes   Other Topics Concern  . Not on file   Social History Narrative   GYN- Dr. 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 Hypertension Father   . Hyperlipidemia Father   . Aneurysm Maternal Grandfather   . Depression Mother     after a car accident  . Alzheimer's disease Other   . Depression Brother     major depression  . Graves' disease Paternal Uncle     ALLERGIES:  is allergic to amoxicillin.  MEDICATIONS:  Current Outpatient Prescriptions  Medication Sig Dispense Refill  . amphetamine-dextroamphetamine (ADDERALL) 10  MG tablet TK 1 T PO UP TO 5 TIMES D  0  . FLUoxetine (PROZAC) 20 MG capsule Take 1 capsule by mouth daily.    . hydrochlorothiazide (HYDRODIURIL) 25 MG tablet Take 1 tablet (25 mg total) by mouth daily. 30 tablet 11  . levonorgestrel (MIRENA) 20 MCG/24HR IUD 1 each by Intrauterine route once.     No current facility-administered medications for this visit.     REVIEW OF SYSTEMS:   Constitutional: Denies fevers, chills or abnormal night sweats Eyes: Denies blurriness of vision, double vision or watery eyes Ears, nose, mouth, throat, and face: Denies mucositis or sore throat Respiratory: Denies cough, dyspnea or wheezes Cardiovascular: Denies palpitation, chest discomfort or lower extremity swelling Gastrointestinal:  Denies nausea, heartburn or  change in bowel habits Skin: Denies abnormal skin rashes Lymphatics: Denies new lymphadenopathy , Complains of easy bruising and musculoskeletal pain and fatigue Neurological:Denies numbness, tingling or new weaknesses Behavioral/Psych: Mood is stable, no new changes   All other systems were reviewed with the patient and are negative.  PHYSICAL EXAMINATION: ECOG PERFORMANCE STATUS: 1 - Symptomatic but completely ambulatory  Vitals:   03/22/16 1255  BP: 134/89  Pulse: 65  Resp: 17  Temp: 98.8 F (37.1 C)   Filed Weights   03/22/16 1255  Weight: 190 lb 4.8 oz (86.3 kg)    GENERAL:alert, no distress and comfortable SKIN: skin color, texture, turgor are normal, no rashes or significant lesions EYES: normal, conjunctiva are pink and non-injected, sclera clear OROPHARYNX:no exudate, no erythema and lips, buccal mucosa, and tongue normal  NECK: supple, thyroid normal size, non-tender, without nodularity LYMPH:  no palpable lymphadenopathy in the cervical, axillary or inguinal LUNGS: clear to auscultation and percussion with normal breathing effort HEART: regular rate & rhythm and no murmurs and no lower extremity edema ABDOMEN:abdomen soft, non-tender and normal bowel sounds Musculoskeletal:no cyanosis of digits and no clubbing  PSYCH: alert & oriented x 3 with fluent speech NEURO: no focal motor/sensory deficits  LABORATORY DATA:  I have reviewed the data as listed Lab Results  Component Value Date   WBC 14.1 (H) 03/22/2016   HGB 14.0 03/22/2016   HCT 42.8 03/22/2016   MCV 77.1 (L) 03/22/2016   PLT 256 03/22/2016   Lab Results  Component Value Date   NA 137 02/11/2016   K 4.3 02/11/2016   CL 102 02/11/2016   CO2 27 02/11/2016    RADIOGRAPHIC STUDIES: I have personally reviewed the radiological reports and agreed with the findings in the report.  ASSESSMENT AND PLAN:  Leukocytosis Chronic leukocytosis at least since 2011 but more prominently since 2015 Primarily  neutrophilia  Differential diagnosis: 1. Inflammation 2. Infection 3. Myeloproliferative disorders 4. Medications like steroids   Workup: 1. C-reactive protein 2. Peripheral blood flow cytometry 3. JAK-2 mutation testing 4. BCR/ABL 5. Evaluation of peripheral smear and CBC with differential 6. Creatinine kinase levels because she has complaints of myositis 7. Rheumatoid factor and ANA to rule out autoimmune diseases like lupus  Return to clinic in 2 weeks to discuss the results   All questions were answered. The patient knows to call the clinic with any problems, questions or concerns.    Rulon Eisenmenger, MD 03/22/16

## 2016-03-22 NOTE — Assessment & Plan Note (Signed)
Chronic leukocytosis at least since 2011 but more prominently since 2015 Primarily neutrophilia  Differential diagnosis: 1. Inflammation 2. Infection 3. Myeloproliferative disorders  Workup: 1. C-reactive protein 2. Peripheral blood flow cytometry 3. JAK-2 mutation testing 4. BCR/ABL 5. Evaluation of peripheral smear and CBC with differential  Return to clinic in 2 weeks to discuss the results

## 2016-03-22 NOTE — Telephone Encounter (Signed)
appt made and avs printed. Pt sent back to lab today

## 2016-03-23 LAB — T4: T4 TOTAL: 7.2 ug/dL (ref 4.5–12.0)

## 2016-03-23 LAB — C-REACTIVE PROTEIN: CRP: 8.1 mg/L — ABNORMAL HIGH (ref 0.0–4.9)

## 2016-03-23 LAB — T3, FREE: T3 FREE: 3.1 pg/mL (ref 2.0–4.4)

## 2016-03-23 LAB — RHEUMATOID FACTOR

## 2016-03-23 LAB — TSH: TSH: 2.646 m[IU]/L (ref 0.308–3.960)

## 2016-03-23 LAB — CK: Creatine Kinase Total: 115 U/L (ref 24–173)

## 2016-03-23 LAB — VITAMIN D 25 HYDROXY (VIT D DEFICIENCY, FRACTURES): VIT D 25 HYDROXY: 36.8 ng/mL (ref 30.0–100.0)

## 2016-03-24 LAB — ANTINUCLEAR ANTIBODIES, IFA: ANA Titer 1: NEGATIVE

## 2016-03-29 ENCOUNTER — Encounter: Payer: Self-pay | Admitting: Family Medicine

## 2016-03-29 ENCOUNTER — Encounter: Payer: Self-pay | Admitting: Hematology and Oncology

## 2016-03-29 LAB — FLOW CYTOMETRY

## 2016-04-06 ENCOUNTER — Ambulatory Visit (HOSPITAL_BASED_OUTPATIENT_CLINIC_OR_DEPARTMENT_OTHER): Payer: Managed Care, Other (non HMO) | Admitting: Hematology and Oncology

## 2016-04-06 ENCOUNTER — Encounter: Payer: Self-pay | Admitting: Family Medicine

## 2016-04-06 ENCOUNTER — Encounter: Payer: Self-pay | Admitting: Hematology and Oncology

## 2016-04-06 DIAGNOSIS — D72828 Other elevated white blood cell count: Secondary | ICD-10-CM

## 2016-04-06 DIAGNOSIS — D72829 Elevated white blood cell count, unspecified: Secondary | ICD-10-CM

## 2016-04-06 DIAGNOSIS — R7982 Elevated C-reactive protein (CRP): Secondary | ICD-10-CM

## 2016-04-06 DIAGNOSIS — N939 Abnormal uterine and vaginal bleeding, unspecified: Secondary | ICD-10-CM

## 2016-04-06 NOTE — Assessment & Plan Note (Signed)
Chronic leukocytosis at least since 2011 but more prominently since 2015 Primarily neutrophilia  Lab work: 1. C-reactive protein: Elevated at 8.1 2. Peripheral blood flow cytometry: Normal 3. MPN Panel: Negative for myeloproliferative neoplasm panel that includes JAK-2 4. BCR/ABL: Not detected 5. Evaluation of peripheral smear and CBC with differential: No abnormal cells 6. Creatinine kinase levels because she has complaints of myositis: 115 normal 7. Rheumatoid factor and ANA to rule out autoimmune diseases like lupus: Negative  Based upon the above results I believe the primary cause of neutrophilia is due to inflammation. No additional workup is necessary. I discussed with her about processes that can reduce the degree of inflammation.  Return to clinic on an as-needed basis.

## 2016-04-06 NOTE — Progress Notes (Signed)
Patient Care Team: Abner Greenspan, MD as PCP - General (Family Medicine)  DIAGNOSIS: Leukocytosis primarily neutrophilia Related to inflammation  CHIEF COMPLIANT: Severe fatigue, brownish vaginal discharge for last 6 months  INTERVAL HISTORY: Michele Rojas is a 30 year old who was referred to [place] for elevated white blood cell count. It appears that she has had chronic elevation of white blood cell count in the 19-20,000 range. These were primarily neutrophils. But 7 or 8 months ago she had delivered a baby and since then she continues to have discharge. She has IUD. She had a vaginal ultrasound and pelvic examination but no clear-cut abnormality was identified. She was referred to me for leukocytosis. She complains of diffuse aches and pains throughout her muscles. She is also concerned about the blotchiness of the skin. Denies any fevers or chills. She is here accompanied by her mother. We've done extensive blood work and she is here to discuss the results.  REVIEW OF SYSTEMS:   Constitutional: Denies fevers, chills or abnormal weight loss, Generalized fatigue and weakness  Eyes: Denies blurriness of vision Ears, nose, mouth, throat, and face: Denies mucositis or sore throat Respiratory: Denies cough, dyspnea or wheezes Cardiovascular: Denies palpitation, chest discomfort Gastrointestinal:  Denies nausea, heartburn or change in bowel habits Skin: Denies abnormal skin rashes Lymphatics: Denies new lymphadenopathy or easy bruising Neurological:Denies numbness, tingling or new weaknesses Behavioral/Psych: Mood is stable, no new changes  Extremities: No lower extremity edema GU: Persistent brownish vaginal discharge  Breast:  denies any pain or lumps or nodules in either breasts All other systems were reviewed with the patient and are negative.  I have reviewed the past medical history, past surgical history, social history and family history with the patient and they are unchanged from  previous note.  ALLERGIES:  is allergic to amoxicillin.  MEDICATIONS:  Current Outpatient Prescriptions  Medication Sig Dispense Refill  . amphetamine-dextroamphetamine (ADDERALL) 10 MG tablet TK 1 T PO UP TO 5 TIMES D  0  . FLUoxetine (PROZAC) 20 MG capsule Take 1 capsule by mouth daily.    . hydrochlorothiazide (HYDRODIURIL) 25 MG tablet Take 1 tablet (25 mg total) by mouth daily. 30 tablet 11  . levonorgestrel (MIRENA) 20 MCG/24HR IUD 1 each by Intrauterine route once.     No current facility-administered medications for this visit.     PHYSICAL EXAMINATION: ECOG PERFORMANCE STATUS: 1 - Symptomatic but completely ambulatory  Vitals:   04/06/16 1120  BP: (!) 141/94  Pulse: 95  Resp: 18  Temp: 98.4 F (36.9 C)   Filed Weights   04/06/16 1120  Weight: 190 lb 6.4 oz (86.4 kg)    GENERAL:alert, no distress and comfortable SKIN: skin color, texture, turgor are normal, no rashes or significant lesions EYES: normal, Conjunctiva are pink and non-injected, sclera clear OROPHARYNX:no exudate, no erythema and lips, buccal mucosa, and tongue normal  NECK: supple, thyroid normal size, non-tender, without nodularity LYMPH:  no palpable lymphadenopathy in the cervical, axillary or inguinal LUNGS: clear to auscultation and percussion with normal breathing effort HEART: regular rate & rhythm and no murmurs and no lower extremity edema ABDOMEN:abdomen soft, non-tender and normal bowel sounds MUSCULOSKELETAL:no cyanosis of digits and no clubbing  NEURO: alert & oriented x 3 with fluent speech, no focal motor/sensory deficits EXTREMITIES: No lower extremity edema  LABORATORY DATA:  I have reviewed the data as listed   Chemistry      Component Value Date/Time   NA 138 03/22/2016 1431   K  3.8 03/22/2016 1431   CL 102 02/11/2016 1841   CO2 29 03/22/2016 1431   BUN 8.3 03/22/2016 1431   CREATININE 0.8 03/22/2016 1431      Component Value Date/Time   CALCIUM 10.0 03/22/2016 1431    ALKPHOS 137 03/22/2016 1431   AST 28 03/22/2016 1431   ALT 50 03/22/2016 1431   BILITOT 0.39 03/22/2016 1431       Lab Results  Component Value Date   WBC 14.1 (H) 03/22/2016   HGB 14.0 03/22/2016   HCT 42.8 03/22/2016   MCV 77.1 (L) 03/22/2016   PLT 256 03/22/2016   NEUTROABS 8.9 (H) 03/22/2016     ASSESSMENT & PLAN:  Leukocytosis Chronic leukocytosis at least since 2011 but more prominently since 2015 Primarily neutrophilia  Lab work: 1. C-reactive protein: Elevated at 8.1 2. Peripheral blood flow cytometry: Normal 3. MPN Panel: Negative for myeloproliferative neoplasm panel that includes JAK-2 4. BCR/ABL: Not detected 5. Evaluation of peripheral smear and CBC with differential: No abnormal cells 6. Creatinine kinase levels because she has complaints of myositis: 115 normal 7. Rheumatoid factor and ANA to rule out autoimmune diseases like lupus: Negative  Based upon the above results I believe the primary cause of neutrophilia is due to inflammation. No additional workup is necessary. I discussed with her about processes that can reduce the degree of inflammation.  Vaginal discharge: Patient was asked to make an appointment to see her gynecologist. Severe fatigue: Patient will make an appointment to see her primary care physician. I discussed with her that I not an expert on understanding the hormonal changes post pregnancy and whether postpartum depression or any other issues may be contributing to her fatigue. She understands the thyroid panel was normal.  Return to clinic on an as-needed basis.   No orders of the defined types were placed in this encounter.  The patient has a good understanding of the overall plan. she agrees with it. she will call with any problems that may develop before the next visit here.   Rulon Eisenmenger, MD 04/06/16

## 2016-04-07 ENCOUNTER — Encounter: Payer: Self-pay | Admitting: Family Medicine

## 2016-04-07 DIAGNOSIS — N939 Abnormal uterine and vaginal bleeding, unspecified: Secondary | ICD-10-CM | POA: Insufficient documentation

## 2016-04-08 NOTE — Telephone Encounter (Signed)
Ref to gyn at [facility]  Will forward to [facility]

## 2016-04-14 ENCOUNTER — Ambulatory Visit (INDEPENDENT_AMBULATORY_CARE_PROVIDER_SITE_OTHER): Payer: Managed Care, Other (non HMO) | Admitting: Family Medicine

## 2016-04-14 ENCOUNTER — Encounter: Payer: Self-pay | Admitting: Family Medicine

## 2016-04-14 VITALS — BP 118/68 | HR 100 | Temp 98.1°F | Ht 66.5 in | Wt 191.2 lb

## 2016-04-14 DIAGNOSIS — F4323 Adjustment disorder with mixed anxiety and depressed mood: Secondary | ICD-10-CM | POA: Diagnosis not present

## 2016-04-14 DIAGNOSIS — I10 Essential (primary) hypertension: Secondary | ICD-10-CM

## 2016-04-14 DIAGNOSIS — D72829 Elevated white blood cell count, unspecified: Secondary | ICD-10-CM | POA: Diagnosis not present

## 2016-04-14 DIAGNOSIS — N939 Abnormal uterine and vaginal bleeding, unspecified: Secondary | ICD-10-CM | POA: Diagnosis not present

## 2016-04-14 NOTE — Progress Notes (Signed)
Pre visit review using our clinic review tool, if applicable. No additional management support is needed unless otherwise documented below in the visit note. 

## 2016-04-14 NOTE — Assessment & Plan Note (Signed)
This is ongoing- thought to be related to vaginal d/c and inflammation (that may inc her wbc) She has new gyn appt at [place] mid month

## 2016-04-14 NOTE — Progress Notes (Signed)
Subjective:    Patient ID: Michele Rojas, female    DOB: 1985/10/21, 30 y.o.   MRN: [id number]  HPI Here for f/u of HTN bp was quite high at the oncology office   BP Readings from Last 3 Encounters:  04/14/16 126/64  04/06/16 (!) 141/94  03/22/16 134/89    She says it really goes up and down  At home it goes from one extreme to the other - 148/92 to normal    Has seen Dr Lindi Adie for hematology  Linked her elevated wbc to her ongoing gyn issues (vag bleeding and d/c) Overall re assuring We did refer her to a new gyn at [facility] for this   Still having emotional issues - ? If PP depression or PTSD - has visit with her psychiatrist  She quit her prozac - was not helping / made her worse  Review of Systems    Review of Systems  Constitutional: Negative for fever, appetite change,  and unexpected weight change. pos for fatiigue  ENT pos for intermittent ST  Eyes: Negative for pain and visual disturbance.  Respiratory: Negative for cough and shortness of breath.   Cardiovascular: Negative for cp or palpitations    Gastrointestinal: Negative for nausea, diarrhea and constipation.  Genitourinary: Negative for urgency and frequency. pos for vaginal bleeding and d/x Skin: Negative for pallor or rash  MSK pos for aches and pains w/o joint swelling Neurological: Negative for weakness, light-headedness, numbness and headaches.  Hematological: Negative for adenopathy. Does not bruise/bleed easily.  Psychiatric/Behavioral: Negative for dysphoric mood. The patient is not nervous/anxious.      Objective:   Physical Exam  Constitutional: She appears well-developed and well-nourished. No distress.  overwt and well appearing   HENT:  Head: Normocephalic and atraumatic.  Mouth/Throat: Oropharynx is clear and moist.  Mild clear pnd   Eyes: Conjunctivae and EOM are normal. Pupils are equal, round, and reactive to light.  Neck: Normal range of motion. Neck supple. No JVD present. Carotid  bruit is not present. No thyromegaly present.  Cardiovascular: Normal rate, regular rhythm, normal heart sounds and intact distal pulses.  Exam reveals no gallop.   Pulmonary/Chest: Effort normal and breath sounds normal. No respiratory distress. She has no wheezes. She has no rales.  No crackles  Abdominal: Soft. Bowel sounds are normal. She exhibits no distension, no abdominal bruit and no mass. There is no tenderness.  Musculoskeletal: She exhibits no edema.  No joint tenderness  Lymphadenopathy:    She has no cervical adenopathy.  Neurological: She is alert. She has normal reflexes.  Skin: Skin is warm and dry. No rash noted.  Psychiatric: Her speech is normal and behavior is normal. Thought content normal. Her mood appears anxious. Her affect is not blunt, not labile and not inappropriate. Thought content is not paranoid. She exhibits a depressed mood. She expresses no homicidal and no suicidal ideation.          Assessment & Plan:   Problem List Items Addressed This Visit      Cardiovascular and Mediastinum   Essential hypertension    bp is improved Suspect some degree of lability of bp with stress/anxeity and whitecoat syndrome at times Last bp here was good  She was anxious in hematology office last  Good bp today BP: 118/68   Adv her not to check at home at this point- it is stressing her out  If she must check it -disc proper way and time (when relaxed)  Will continue to follow She has f/u with psychiatry upcoming         Other   Vaginal bleeding    This is ongoing- thought to be related to vaginal d/c and inflammation (that may inc her wbc) She has new gyn appt at [facility] mid month       Leukocytosis    Pt was seen by hematology with re assuring w/u Suspected cause is inflammatory/ gyn in nature She has gyn appt upcoming mid month-new at [place] disorder with mixed anxiety and depressed mood - Primary    Reviewed stressors/ coping  techniques/symptoms/ support sources/ tx options and side effects in detail today Some post partum symptoms - hormonal and stress related She stopped prozac due to ineffectiveness Will f/u with her psychiatrist in about a week  Recommended counseling as well Denies SI        Other Visit Diagnoses   None.

## 2016-04-14 NOTE — Assessment & Plan Note (Signed)
Reviewed stressors/ coping techniques/symptoms/ support sources/ tx options and side effects in detail today Some post partum symptoms - hormonal and stress related She stopped prozac due to ineffectiveness Will f/u with her psychiatrist in about a week  Recommended counseling as well Denies SI

## 2016-04-14 NOTE — Assessment & Plan Note (Signed)
Pt was seen by hematology with re assuring w/u Suspected cause is inflammatory/ gyn in nature She has gyn appt upcoming mid month-new at [facility]

## 2016-04-14 NOTE — Patient Instructions (Addendum)
Great blood pressure  Stop checking it at home- I am re assured See the psychiatrist for stress reaction /post partum mood issues  See the gyn as well   Take care of yourself the best you can

## 2016-04-14 NOTE — Assessment & Plan Note (Signed)
bp is improved Suspect some degree of lability of bp with stress/anxeity and whitecoat syndrome at times Last bp here was good  She was anxious in hematology office last  Good bp today BP: 118/68   Adv her not to check at home at this point- it is stressing her out  If she must check it -disc proper way and time (when relaxed) Will continue to follow She has f/u with psychiatry upcoming

## 2016-04-25 LAB — HM PAP SMEAR: HM Pap smear: NORMAL

## 2016-05-02 ENCOUNTER — Other Ambulatory Visit: Payer: Self-pay | Admitting: Family Medicine

## 2016-05-11 ENCOUNTER — Encounter: Payer: Self-pay | Admitting: Family Medicine

## 2016-05-11 ENCOUNTER — Other Ambulatory Visit: Payer: Self-pay | Admitting: Family Medicine

## 2016-05-12 NOTE — Telephone Encounter (Signed)
I didn't fill this med yesterday because you filled it on 02/11/16 #30 with 11 additional refills so she should have plenty of refill on file, please see mychart message and advise pt if she should keep taking med or not, thanks

## 2016-05-19 ENCOUNTER — Telehealth: Payer: Self-pay | Admitting: Family Medicine

## 2016-05-19 NOTE — Telephone Encounter (Signed)
I spoke to patient and she scheduled appointment on 05/25/16 at 4:15.

## 2016-05-19 NOTE — Telephone Encounter (Signed)
Patient needs a physical done before the end of the month for her insurance. Dr.Tower's first available for a physical is March,2018.  Patient wants to know if she can have a physical done this month.  Please advise.

## 2016-05-19 NOTE — Telephone Encounter (Signed)
Please put her in whatever 30 min slot you can  A 4:15 appt is also fine  Thanks

## 2016-05-25 ENCOUNTER — Encounter: Payer: Self-pay | Admitting: Family Medicine

## 2016-05-25 ENCOUNTER — Ambulatory Visit (INDEPENDENT_AMBULATORY_CARE_PROVIDER_SITE_OTHER): Payer: Managed Care, Other (non HMO) | Admitting: Family Medicine

## 2016-05-25 VITALS — BP 124/78 | HR 102 | Temp 98.0°F | Ht 66.5 in | Wt 197.5 lb

## 2016-05-25 DIAGNOSIS — I10 Essential (primary) hypertension: Secondary | ICD-10-CM | POA: Diagnosis not present

## 2016-05-25 DIAGNOSIS — E7800 Pure hypercholesterolemia, unspecified: Secondary | ICD-10-CM | POA: Diagnosis not present

## 2016-05-25 DIAGNOSIS — Z0000 Encounter for general adult medical examination without abnormal findings: Secondary | ICD-10-CM

## 2016-05-25 DIAGNOSIS — E039 Hypothyroidism, unspecified: Secondary | ICD-10-CM

## 2016-05-25 DIAGNOSIS — F17200 Nicotine dependence, unspecified, uncomplicated: Secondary | ICD-10-CM

## 2016-05-25 DIAGNOSIS — F4323 Adjustment disorder with mixed anxiety and depressed mood: Secondary | ICD-10-CM

## 2016-05-25 DIAGNOSIS — D72829 Elevated white blood cell count, unspecified: Secondary | ICD-10-CM | POA: Diagnosis not present

## 2016-05-25 NOTE — Patient Instructions (Signed)
Labs today  Keep thinking about quitting smoking See dermatology as planned  I'm glad things are going better  For cholesterol    Avoid red meat/ fried foods/ egg yolks/ fatty breakfast meats/ butter, cheese and high fat dairy/ and shellfish   When you are ready to get some exercise- get outdoors and walk (for mood and general health)

## 2016-05-25 NOTE — Progress Notes (Signed)
Subjective:    Patient ID: Michele Rojas, female    DOB: 1986/03/09, 30 y.o.   MRN: [id number]  HPI Here for health maintenance exam and to review chronic medical problems    Doing better overall  Had a tooth pulled and dry socket-now is healing up   Wt Readings from Last 3 Encounters:  05/25/16 197 lb 8 oz (89.6 kg)  04/14/16 191 lb 4 oz (86.8 kg)  04/06/16 190 lb 6.4 oz (86.4 kg)  bmi is 31.4 Not taking care of herself/ not enough sleep or time for exercise/ diet is not great  Parents are still helping out    Pap test-last month with new gyn - came back normal  New gyn did a biopsy- endometrial - that came out ok  Hormone levels were consistent with post partum  Has stopped bleeding/ with IUD  Hx of HPV and abn paps in the past with colposcopy They were considering rheumatology for generalized inflammation   Flu shot - had it already   Tetanus shot 12/16  HIV screening neg in 2015  Remote hx of hypothyroidism Lab Results  Component Value Date   TSH 2.646 03/22/2016    Hx of elevated wbc Has seen hematology Lab Results  Component Value Date   WBC 14.1 (H) 03/22/2016   HGB 14.0 03/22/2016   HCT 42.8 03/22/2016   MCV 77.1 (L) 03/22/2016   PLT 256 03/22/2016      Chemistry      Component Value Date/Time   NA 138 03/22/2016 1431   K 3.8 03/22/2016 1431   CL 102 02/11/2016 1841   CO2 29 03/22/2016 1431   BUN 8.3 03/22/2016 1431   CREATININE 0.8 03/22/2016 1431      Component Value Date/Time   CALCIUM 10.0 03/22/2016 1431   ALKPHOS 137 03/22/2016 1431   AST 28 03/22/2016 1431   ALT 50 03/22/2016 1431   BILITOT 0.39 03/22/2016 1431     vit D level was 36.8  Now is taking some vit D 1000 iu daily    Cholesterol Lab Results  Component Value Date   CHOL 238 (H) 02/11/2016   HDL 29.20 (L) 02/11/2016   LDLDIRECT 168.0 02/11/2016   TRIG 377.0 (H) 02/11/2016   CHOLHDL 8 02/11/2016  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 -herediitary Does not eat fish- but can try  Exercise-  not time now  LDL 168  Triglycerides 377   She thinks since she is emotionally feeling better she can soon begin to work on this    Smoking status   bp is stable today  No cp or palpitations or headaches or edema  No side effects to medicines  BP Readings from Last 3 Encounters:  05/25/16 124/78  04/14/16 118/68  04/06/16 (!) 141/94    On hctz    Mood  -seeing psychiatrist ([facility]) - Marguarite Arbour  Seeing a counselor  On trintellix (so far so good)  New med soon to start for sleep-cannot remember the name   Last glucose was 21   Patient Active Problem List   Diagnosis Date Noted  . Routine general medical examination at a health care facility 05/25/2016  . Lipoma of lower extremity 03/08/2016  . Hyperlipidemia 02/22/2016  . Leukocytosis 02/20/2016  . 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 Essential hypertension 01/07/2016  . Placental abruption 09/23/2015  . Encounter for biometric screening 09/30/2013  . Screening for lipoid disorders 09/28/2013  . Diabetes mellitus screening 09/28/2013  . Hypothyroid 11/04/2011  .  Depression 10/04/2011  . HEADACHE 11/18/2009  . Adjustment disorder with mixed anxiety and depressed mood 11/17/2009  . FATIGUE 10/06/2009  . TOBACCO USE 02/19/2009  . CHICKENPOX, HX OF 02/19/2009  . DEFICIENCY OF OTHER VITAMINS 04/29/2008  . OTHER CONSTIPATION 04/29/2008   Past Medical History:  Diagnosis Date  . ADD (attention deficit disorder)    no meds  . Constipation   . Depression   . Fatigue   . Headache    migraines  . History of chicken pox as a child  . History of preterm delivery    2015- 31wks, 2017- 35wks  . HPV (human papilloma virus) infection   . Hx of abnormal cervical Pap smear    ASCUS, LGSIL, CIN I, [place]  . Hypothyroidism    no meds  . Mood swings ([place])   . Preeclampsia    2015 & 2017 pregnancies  . Smoker   . Tobacco abuse   . Vitamin deficiency    Past Surgical History:  Procedure Laterality Date  .  ACHILLES TENDON SURGERY    . BARTHOLIN GLAND CYST EXCISION    . CESAREAN SECTION N/A 06/09/2014   Procedure: CESAREAN SECTION;  Surgeon: Farrel Gobble. Harrington Challenger, MD;  Location: [facility];  Service: Obstetrics;  Laterality: N/A;  . CESAREAN SECTION N/A 09/22/2015   Procedure: CESAREAN SECTION;  Surgeon: Jerelyn Charles, MD;  Location: [facility];  Service: Obstetrics;  Laterality: N/A;  . CHOLECYSTECTOMY, LAPAROSCOPIC    . WISDOM TOOTH EXTRACTION     Social History  Substance Use Topics  . Smoking status: Current Every Day Smoker    Packs/day: 1.00    Types: Cigarettes  . Smokeless tobacco: Never Used  . Alcohol use No     Comment: socially   Family History  Problem Relation Age of Onset  . Hypertension Father   . Hyperlipidemia Father   . Aneurysm Maternal Grandfather   . Depression Mother     after a car accident  . Alzheimer's disease Other   . Depression Brother     major depression  . Graves' disease Paternal Uncle    Allergies  Allergen Reactions  . Amoxicillin Other (See Comments)    REACTION: yeast infection in mouth Has patient had a PCN reaction causing immediate rash, facial/tongue/throat swelling, SOB or lightheadedness with hypotension: No Has patient had a PCN reaction causing severe rash involving mucus membranes or skin necrosis: No Has patient had a PCN reaction that required hospitalization no Has patient had a PCN reaction occurring within the last 10 years: Yes If all of the above answers are "NO", then may proceed with Cephalosporin use.   Current Outpatient Prescriptions on File Prior to Visit  Medication Sig Dispense Refill  . amphetamine-dextroamphetamine (ADDERALL) 10 MG tablet TAKE ONE TABLET BY MOUTH UP TO 6 TIMES DAILY  0  . Cholecalciferol (VITAMIN D3 PO) Take 1 capsule by mouth daily.    . hydrochlorothiazide (HYDRODIURIL) 25 MG tablet Take 1 tablet (25 mg total) by mouth daily. 30 tablet 11  . levonorgestrel (MIRENA) 20 MCG/24HR IUD 1 each by Intrauterine route  once.    . TURMERIC PO Take 1 capsule by mouth daily.     No current facility-administered medications on file prior to visit.     Review of Systems Review of Systems  Constitutional: Negative for fever, appetite change, and unexpected weight change. pos for fatigue from mood and stressors  ENT pos for recent pulled tooth from dry socket  Eyes: Negative for pain  and visual disturbance.  Respiratory: Negative for cough and shortness of breath.   Cardiovascular: Negative for cp or palpitations    Gastrointestinal: Negative for nausea, diarrhea and constipation.  Genitourinary: Negative for urgency and frequency.  Skin: Negative for pallor or rash   Neurological: Negative for weakness, light-headedness, numbness and headaches.  Hematological: Negative for adenopathy. Does not bruise/bleed easily.  Psychiatric/Behavioral: pos for anx and depressive symptoms that are improving, neg for SI.         Objective:   Physical Exam  Constitutional: She appears well-developed and well-nourished. No distress.  overwt and well appearing Improved affect   HENT:  Head: Normocephalic and atraumatic.  Right Ear: External ear normal.  Left Ear: External ear normal.  Nose: Nose normal.  Mouth/Throat: Oropharynx is clear and moist.  Eyes: Conjunctivae and EOM are normal. Pupils are equal, round, and reactive to light. Right eye exhibits no discharge. Left eye exhibits no discharge. No scleral icterus.  Neck: Normal range of motion. Neck supple. No JVD present. Carotid bruit is not present. No thyromegaly present.  Cardiovascular: Normal rate, regular rhythm, normal heart sounds and intact distal pulses.  Exam reveals no gallop.   Pulmonary/Chest: Effort normal and breath sounds normal. No respiratory distress. She has no wheezes. She has no rales.  Abdominal: Soft. Bowel sounds are normal. She exhibits no distension and no mass. There is no tenderness.  Genitourinary:  Genitourinary Comments: Breast  exam: No mass, nodules, thickening, tenderness, bulging, retraction, inflamation, nipple discharge or skin changes noted.  No axillary or clavicular LA.      Musculoskeletal: She exhibits no edema or tenderness.  Lymphadenopathy:    She has no cervical adenopathy.  Neurological: She is alert. She has normal reflexes. No cranial nerve deficit. She exhibits normal muscle tone. Coordination normal.  Skin: Skin is warm and dry. No rash noted. No erythema. No pallor.  Baseline raised nevus on R upper lip and several other areas on face Mildly tanned Lentigines diffusely  Psychiatric: She has a normal mood and affect.          Assessment & Plan:   Problem List Items Addressed This Visit      Cardiovascular and Mediastinum   Essential hypertension    bp in fair control at this time  BP Readings from Last 1 Encounters:  05/25/16 124/78   No changes needed Disc lifstyle change with low sodium diet and exercise  Continues hctz  Enc exercise when ready        Endocrine   Hypothyroid    Lab Results  Component Value Date   TSH 2.646 03/22/2016  this was from summer  No changes clinically (besides depression and having a baby) Wellness labs done today as well         Other   Adjustment disorder with mixed anxiety and depressed mood    Gradually improving with medication and counseling (psychiatrist and psychologist on her case) On trintellix and tolerating well emph imp of exercise when ready as well as outdoor time        Hyperlipidemia    Re check lipids today  She has not worked on diet as of yet but plans to do so Rev diet  emph imp of avoiding sat and trans fats  Handout given  Consider statin if lifestyle change does not help in the future       Leukocytosis    Heme w/u unrevealing so far - suspected it was due to gyn cause /  inflammatory  Cbc today  Of note-she just had a tooth pulled also      Routine general medical examination at a health care facility -  Primary    Reviewed health habits including diet and exercise and skin cancer prevention Reviewed appropriate screening tests for age  Also reviewed health mt list, fam hx and immunization status , as well as social and family history   See hPI Labs today  Will fill out biometric form for work when labs return      Relevant Orders   CBC with Differential/Platelet   TSH   Comprehensive metabolic panel   Lipid panel   TOBACCO USE    Disc in detail risks of smoking and possible outcomes including copd, vascular/ heart disease, cancer , respiratory and sinus infections  Pt voices understanding Pt thinks she will be in a better mind set to quit when her mood issues are improved        Other Visit Diagnoses   None.

## 2016-05-25 NOTE — Progress Notes (Signed)
Pre visit review using our clinic review tool, if applicable. No additional management support is needed unless otherwise documented below in the visit note. 

## 2016-05-26 ENCOUNTER — Telehealth: Payer: Self-pay | Admitting: *Deleted

## 2016-05-26 ENCOUNTER — Encounter: Payer: Self-pay | Admitting: Family Medicine

## 2016-05-26 LAB — CBC WITH DIFFERENTIAL/PLATELET
BASOS ABS: 0.1 10*3/uL (ref 0.0–0.1)
Basophils Relative: 0.4 % (ref 0.0–3.0)
EOS ABS: 0.2 10*3/uL (ref 0.0–0.7)
Eosinophils Relative: 0.7 % (ref 0.0–5.0)
HEMATOCRIT: 43.1 % (ref 36.0–46.0)
HEMOGLOBIN: 14.3 g/dL (ref 12.0–15.0)
LYMPHS PCT: 24.6 % (ref 12.0–46.0)
Lymphs Abs: 5.3 10*3/uL — ABNORMAL HIGH (ref 0.7–4.0)
MCHC: 33.3 g/dL (ref 30.0–36.0)
MCV: 76.8 fl — ABNORMAL LOW (ref 78.0–100.0)
MONO ABS: 0.6 10*3/uL (ref 0.1–1.0)
Monocytes Relative: 2.7 % — ABNORMAL LOW (ref 3.0–12.0)
Neutro Abs: 15.3 10*3/uL — ABNORMAL HIGH (ref 1.4–7.7)
Neutrophils Relative %: 71.6 % (ref 43.0–77.0)
Platelets: 345 10*3/uL (ref 150.0–400.0)
RBC: 5.61 Mil/uL — AB (ref 3.87–5.11)
RDW: 18.5 % — AB (ref 11.5–15.5)

## 2016-05-26 LAB — LDL CHOLESTEROL, DIRECT: LDL DIRECT: 171 mg/dL

## 2016-05-26 LAB — LIPID PANEL
Cholesterol: 236 mg/dL — ABNORMAL HIGH (ref 0–200)
HDL: 26.8 mg/dL — AB (ref >39.00)
NONHDL: 208.99
Total CHOL/HDL Ratio: 9
Triglycerides: 327 mg/dL — ABNORMAL HIGH (ref 0.0–149.0)
VLDL: 65.4 mg/dL — ABNORMAL HIGH (ref 0.0–40.0)

## 2016-05-26 LAB — COMPREHENSIVE METABOLIC PANEL
ALT: 29 U/L (ref 0–35)
AST: 23 U/L (ref 0–37)
Albumin: 4.3 g/dL (ref 3.5–5.2)
Alkaline Phosphatase: 113 U/L (ref 39–117)
BILIRUBIN TOTAL: 0.4 mg/dL (ref 0.2–1.2)
BUN: 9 mg/dL (ref 6–23)
CO2: 31 meq/L (ref 19–32)
CREATININE: 0.78 mg/dL (ref 0.40–1.20)
Calcium: 10.2 mg/dL (ref 8.4–10.5)
Chloride: 97 mEq/L (ref 96–112)
GFR: 92.71 mL/min (ref >60.00)
GLUCOSE: 70 mg/dL (ref 70–99)
Potassium: 4.3 mEq/L (ref 3.5–5.1)
SODIUM: 136 meq/L (ref 135–145)
Total Protein: 7.9 g/dL (ref 6.0–8.3)

## 2016-05-26 LAB — TSH: TSH: 3.48 u[IU]/mL (ref 0.35–4.50)

## 2016-05-26 NOTE — Assessment & Plan Note (Signed)
Re check lipids today  She has not worked on diet as of yet but plans to do so Rev diet  emph imp of avoiding sat and trans fats  Handout given  Consider statin if lifestyle change does not help in the future

## 2016-05-26 NOTE — Assessment & Plan Note (Signed)
bp in fair control at this time  BP Readings from Last 1 Encounters:  05/25/16 124/78   No changes needed Disc lifstyle change with low sodium diet and exercise  Continues hctz  Enc exercise when ready

## 2016-05-26 NOTE — Assessment & Plan Note (Signed)
Heme w/u unrevealing so far - suspected it was due to gyn cause / inflammatory  Cbc today  Of note-she just had a tooth pulled also

## 2016-05-26 NOTE — Telephone Encounter (Signed)
Thanks -aware -pending other labs -she is seeing hematology for leukocytosis and will send copy when it appears in epic

## 2016-05-26 NOTE — Telephone Encounter (Signed)
[facility] called with critical lab results on pt.  WBC was 21.4. Hard Copy of results printed and given to Dr. Glori Bickers.

## 2016-05-26 NOTE — Assessment & Plan Note (Signed)
Gradually improving with medication and counseling (psychiatrist and psychologist on her case) On trintellix and tolerating well emph imp of exercise when ready as well as outdoor time

## 2016-05-26 NOTE — Assessment & Plan Note (Signed)
Lab Results  Component Value Date   TSH 2.646 03/22/2016  this was from summer  No changes clinically (besides depression and having a baby) Wellness labs done today as well

## 2016-05-26 NOTE — Assessment & Plan Note (Signed)
Disc in detail risks of smoking and possible outcomes including copd, vascular/ heart disease, cancer , respiratory and sinus infections  Pt voices understanding Pt thinks she will be in a better mind set to quit when her mood issues are improved

## 2016-05-26 NOTE — Assessment & Plan Note (Signed)
Reviewed health habits including diet and exercise and skin cancer prevention Reviewed appropriate screening tests for age  Also reviewed health mt list, fam hx and immunization status , as well as social and family history   See hPI Labs today  Will fill out biometric form for work when labs return

## 2016-06-03 ENCOUNTER — Telehealth: Payer: Self-pay | Admitting: Family Medicine

## 2016-06-03 ENCOUNTER — Encounter: Payer: Self-pay | Admitting: Family Medicine

## 2016-06-03 DIAGNOSIS — D72829 Elevated white blood cell count, unspecified: Secondary | ICD-10-CM

## 2016-06-03 NOTE — Telephone Encounter (Signed)
I did the referral  Will route to [facility] 

## 2016-06-08 NOTE — Telephone Encounter (Signed)
Referral sent to [facility] and patient aware.

## 2016-06-14 ENCOUNTER — Telehealth: Payer: Self-pay

## 2016-06-14 NOTE — Telephone Encounter (Signed)
Pt said today x 2 BP was 150/97; pt had already taken HCTZ; pt has not missed any med. Pt said for last couple of nights has not slept well;maybe 4-5 hours per night; nothing bothering pt; pt has not been anxious or upset. Pt does have h/a (pain level 3-4) and some dizziness.No CP or SOB. Last annual 05/25/16. [facility]. Rather than making appt pt request note sent to Dr Glori Bickers.Pt request cb.

## 2016-06-14 NOTE — Telephone Encounter (Signed)
Pt said that she hasn't really been eating a lot of salty/processed foods, she has had a few hamburgers lately but they were home made not from fast food restaurants, and no other salty foods she can think of. Pt said she has always drank a lot of water so she doesn't think that's an issues and she said she has had some swelling in her hands and feet but they are not swollen right now and it was mild swelling, please advise

## 2016-06-14 NOTE — Telephone Encounter (Signed)
Left voicemail requesting pt to call the office back 

## 2016-06-14 NOTE — Telephone Encounter (Signed)
Is she drinking enough fluids/water?   Has she eaten any very salty/processed foods? Are her ankles swollen?

## 2016-06-14 NOTE — Telephone Encounter (Signed)
Take an extra hctz today/tonight  Let me know how you feel tomorrow and how bp is

## 2016-06-14 NOTE — Telephone Encounter (Signed)
Patient advised.

## 2016-06-15 ENCOUNTER — Telehealth: Payer: Self-pay | Admitting: Family Medicine

## 2016-06-15 ENCOUNTER — Encounter: Payer: Self-pay | Admitting: Family Medicine

## 2016-06-15 NOTE — Telephone Encounter (Signed)
Patient Name: Michele Rojas  DOB: [birth year]    Initial Comment Caller States- BP is 151/102- office asked for me to call in my numbers   Nurse Assessment  Nurse: Leilani Merl, RN, Heather Date/Time (Eastern Time): 06/15/2016 12:56:34 PM  Confirm and document reason for call. If symptomatic, describe symptoms. You must click the next button to save text entered. ---Caller states that her BP is 151/102 and she feels like she is swelling all over, she has a headache.  Has the patient traveled out of the country within the last 30 days? ---Not Applicable  Does the patient have any new or worsening symptoms? ---Yes  Will a triage be completed? ---Yes  Related visit to physician within the last 2 weeks? ---No  Does the PT have any chronic conditions? (i.e. diabetes, asthma, etc.) ---Yes  List chronic conditions. ---See MR  Is the patient pregnant or possibly pregnant? (Ask all females between the ages of 11-55) ---No  Is this a behavioral health or substance abuse call? ---No     Guidelines    Guideline Title Affirmed Question Affirmed Notes  High Blood Pressure BP ? 160/100    Final Disposition User   See PCP When Office is Open (within 3 days) [place], RN, Water quality scientist    Comments  appt at 11:45 am tomorrow with Dr. Glori Bickers.   Referrals  REFERRED TO PCP OFFICE   Disagree/Comply: Comply

## 2016-06-15 NOTE — Telephone Encounter (Signed)
I will see her then  

## 2016-06-15 NOTE — Telephone Encounter (Signed)
Pt has appt with Dr Glori Bickers on 06/16/16 at 11:45.

## 2016-06-16 ENCOUNTER — Ambulatory Visit (INDEPENDENT_AMBULATORY_CARE_PROVIDER_SITE_OTHER): Payer: Managed Care, Other (non HMO) | Admitting: Family Medicine

## 2016-06-16 ENCOUNTER — Encounter: Payer: Self-pay | Admitting: Family Medicine

## 2016-06-16 VITALS — BP 135/85 | HR 107 | Temp 98.4°F | Ht 66.5 in | Wt 201.5 lb

## 2016-06-16 DIAGNOSIS — E7800 Pure hypercholesterolemia, unspecified: Secondary | ICD-10-CM | POA: Diagnosis not present

## 2016-06-16 DIAGNOSIS — I10 Essential (primary) hypertension: Secondary | ICD-10-CM

## 2016-06-16 DIAGNOSIS — F17200 Nicotine dependence, unspecified, uncomplicated: Secondary | ICD-10-CM

## 2016-06-16 LAB — COMPREHENSIVE METABOLIC PANEL
ALT: 34 U/L (ref 0–35)
AST: 27 U/L (ref 0–37)
Albumin: 4.3 g/dL (ref 3.5–5.2)
Alkaline Phosphatase: 104 U/L (ref 39–117)
BUN: 9 mg/dL (ref 6–23)
CHLORIDE: 97 meq/L (ref 96–112)
CO2: 33 meq/L — AB (ref 19–32)
Calcium: 9.9 mg/dL (ref 8.4–10.5)
Creatinine, Ser: 0.75 mg/dL (ref 0.40–1.20)
GFR: 96.96 mL/min (ref >60.00)
GLUCOSE: 90 mg/dL (ref 70–99)
POTASSIUM: 3.9 meq/L (ref 3.5–5.1)
SODIUM: 136 meq/L (ref 135–145)
Total Bilirubin: 0.3 mg/dL (ref 0.2–1.2)
Total Protein: 7.7 g/dL (ref 6.0–8.3)

## 2016-06-16 LAB — CBC WITH DIFFERENTIAL/PLATELET
BASOS PCT: 0.4 % (ref 0.0–3.0)
Basophils Absolute: 0.1 10*3/uL (ref 0.0–0.1)
EOS PCT: 0.5 % (ref 0.0–5.0)
Eosinophils Absolute: 0.1 10*3/uL (ref 0.0–0.7)
HCT: 45.4 % (ref 36.0–46.0)
Hemoglobin: 14.9 g/dL (ref 12.0–15.0)
LYMPHS ABS: 5.2 10*3/uL — AB (ref 0.7–4.0)
Lymphocytes Relative: 30.3 % (ref 12.0–46.0)
MCHC: 32.7 g/dL (ref 30.0–36.0)
MCV: 78.3 fl (ref 78.0–100.0)
MONO ABS: 0.7 10*3/uL (ref 0.1–1.0)
MONOS PCT: 4.3 % (ref 3.0–12.0)
NEUTROS PCT: 64.5 % (ref 43.0–77.0)
Neutro Abs: 11 10*3/uL — ABNORMAL HIGH (ref 1.4–7.7)
Platelets: 276 10*3/uL (ref 150.0–400.0)
RBC: 5.81 Mil/uL — ABNORMAL HIGH (ref 3.87–5.11)
RDW: 19 % — AB (ref 11.5–15.5)
WBC: 17 10*3/uL — ABNORMAL HIGH (ref 4.0–10.5)

## 2016-06-16 MED ORDER — METOPROLOL SUCCINATE ER 50 MG PO TB24: 50.0000 mg | ORAL_TABLET | Freq: Every day | ORAL | 11 refills | Status: DC

## 2016-06-16 NOTE — Progress Notes (Signed)
Pre visit review using our clinic review tool, if applicable. No additional management support is needed unless otherwise documented below in the visit note. 

## 2016-06-16 NOTE — Progress Notes (Signed)
Subjective:    Patient ID: Michele Rojas, female    DOB: [birth year], 30 y.o.   MRN: [id number]  HPI Here for elevated bp in setting of HTN  Noticed bp was inc at home as high as 151/102  BP Readings from Last 3 Encounters:  06/16/16 (!) 144/90  05/25/16 124/78  04/14/16 118/68   Having more headaches-that is why she checked it   Pulse Readings from Last 3 Encounters:  06/16/16 (!) 107  05/25/16 (!) 102  04/14/16 100     Wt Readings from Last 3 Encounters:  06/16/16 201 lb 8 oz (91.4 kg)  05/25/16 197 lb 8 oz (89.6 kg)  04/14/16 191 lb 4 oz (86.8 kg)   bmi is 32.0  Pt is on trintellix for mood   Is on adderall -for a while Recently added one more - she take 60 mg total for ADD from psychiatry  Still doing better with mood/ emotionally better and more even   No regular exercise  Diet has not changed / a fair amt of processed foods  Likes salty foods [place] fries/ popcorn  Has been on a kick of canned soup   No time to exercise Still smoking the same  Still taking hctz 25   Chemistry      Component Value Date/Time   NA 136 05/25/2016 1713   NA 138 03/22/2016 1431   K 4.3 05/25/2016 1713   K 3.8 03/22/2016 1431   CL 97 05/25/2016 1713   CO2 31 05/25/2016 1713   CO2 29 03/22/2016 1431   BUN 9 05/25/2016 1713   BUN 8.3 03/22/2016 1431   CREATININE 0.78 05/25/2016 1713   CREATININE 0.8 03/22/2016 1431      Component Value Date/Time   CALCIUM 10.2 05/25/2016 1713   CALCIUM 10.0 03/22/2016 1431   ALKPHOS 113 05/25/2016 1713   ALKPHOS 137 03/22/2016 1431   AST 23 05/25/2016 1713   AST 28 03/22/2016 1431   ALT 29 05/25/2016 1713   ALT 50 03/22/2016 1431   BILITOT 0.4 05/25/2016 1713   BILITOT 0.39 03/22/2016 1431       Re checking cholesterol today  Lab Results  Component Value Date   CHOL 236 (H) 05/25/2016   HDL 26.80 (L) 05/25/2016   LDLDIRECT 171.0 05/25/2016   TRIG 327.0 (H) 05/25/2016   CHOLHDL 9 05/25/2016   Some hamburgers  occ fries   May be interested in medicine if needed   Patient Active Problem List   Diagnosis Date Noted  . Routine general medical examination at a health care facility 05/25/2016  . Lipoma of lower extremity 03/08/2016  . Hyperlipidemia 02/22/2016  . Leukocytosis 02/20/2016  . 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 Essential hypertension 01/07/2016  . Placental abruption 09/23/2015  . Encounter for biometric screening 09/30/2013  . Screening for lipoid disorders 09/28/2013  . Diabetes mellitus screening 09/28/2013  . Hypothyroid 11/04/2011  . Depression 10/04/2011  . HEADACHE 11/18/2009  . Adjustment disorder with mixed anxiety and depressed mood 11/17/2009  . FATIGUE 10/06/2009  . TOBACCO USE 02/19/2009  . CHICKENPOX, HX OF 02/19/2009  . DEFICIENCY OF OTHER VITAMINS 04/29/2008  . OTHER CONSTIPATION 04/29/2008   Past Medical History:  Diagnosis Date  . ADD (attention deficit disorder)    no meds  . Constipation   . Depression   . Fatigue   . Headache    migraines  . History of chicken pox as a child  . History of preterm delivery  2015- 31wks, 2017- 35wks  . HPV (human papilloma virus) infection   . Hx of abnormal cervical Pap smear    ASCUS, LGSIL, CIN I, [place]  . Hypothyroidism    no meds  . Mood swings ([place])   . Preeclampsia    2015 & 2017 pregnancies  . Smoker   . Tobacco abuse   . Vitamin deficiency    Past Surgical History:  Procedure Laterality Date  . ACHILLES TENDON SURGERY    . BARTHOLIN GLAND CYST EXCISION    . CESAREAN SECTION N/A 06/09/2014   Procedure: CESAREAN SECTION;  Surgeon: Farrel Gobble. Harrington Challenger, MD;  Location: [facility];  Service: Obstetrics;  Laterality: N/A;  . CESAREAN SECTION N/A 09/22/2015   Procedure: CESAREAN SECTION;  Surgeon: Jerelyn Charles, MD;  Location: [facility];  Service: Obstetrics;  Laterality: N/A;  . CHOLECYSTECTOMY, LAPAROSCOPIC    . WISDOM TOOTH EXTRACTION     Social History  Substance Use Topics  . Smoking status: Current Every Day Smoker      Packs/day: 1.00    Types: Cigarettes  . Smokeless tobacco: Never Used  . Alcohol use No     Comment: socially   Family History  Problem Relation Age of Onset  . Hypertension Father   . Hyperlipidemia Father   . Aneurysm Maternal Grandfather   . Depression Mother     after a car accident  . Alzheimer's disease Other   . Depression Brother     major depression  . Graves' disease Paternal Uncle    Allergies  Allergen Reactions  . Amoxicillin Other (See Comments)    REACTION: yeast infection in mouth Has patient had a PCN reaction causing immediate rash, facial/tongue/throat swelling, SOB or lightheadedness with hypotension: No Has patient had a PCN reaction causing severe rash involving mucus membranes or skin necrosis: No Has patient had a PCN reaction that required hospitalization no Has patient had a PCN reaction occurring within the last 10 years: Yes If all of the above answers are "NO", then may proceed with Cephalosporin use.   Current Outpatient Prescriptions on File Prior to Visit  Medication Sig Dispense Refill  . amphetamine-dextroamphetamine (ADDERALL) 10 MG tablet TAKE ONE TABLET BY MOUTH UP TO 6 TIMES DAILY  0  . Cholecalciferol (VITAMIN D3 PO) Take 1 capsule by mouth daily.    . hydrochlorothiazide (HYDRODIURIL) 25 MG tablet Take 1 tablet (25 mg total) by mouth daily. 30 tablet 11  . levonorgestrel (MIRENA) 20 MCG/24HR IUD 1 each by Intrauterine route once.    . TRINTELLIX 20 MG TABS Take 1 tablet by mouth daily.  0  . TURMERIC PO Take 1 capsule by mouth daily.     No current facility-administered medications on file prior to visit.     Review of Systems    Review of Systems  Constitutional: Negative for fever, appetite change, fatigue and unexpected weight change.  Eyes: Negative for pain and visual disturbance.  Respiratory: Negative for cough and shortness of breath.   Cardiovascular: Negative for cp or palpitations    Gastrointestinal: Negative for  nausea, diarrhea and constipation.  Genitourinary: Negative for urgency and frequency.  Skin: Negative for pallor or rash   Neurological: Negative for weakness, light-headedness, numbness and headaches.  Hematological: Negative for adenopathy. Does not bruise/bleed easily.  Psychiatric/Behavioral: Negative for dysphoric mood. The patient is not nervous/anxious.  (mood is improved)    Objective:   Physical Exam  Constitutional: She appears well-developed and well-nourished. No distress.  overwt and  well app  HENT:  Head: Normocephalic and atraumatic.  Mouth/Throat: Oropharynx is clear and moist.  Eyes: Conjunctivae and EOM are normal. Pupils are equal, round, and reactive to light.  Neck: Normal range of motion. Neck supple. No JVD present. Carotid bruit is not present. No thyromegaly present.  Cardiovascular: Regular rhythm, normal heart sounds and intact distal pulses.  Exam reveals no gallop.   Rate around 100  Pulmonary/Chest: Effort normal and breath sounds normal. No respiratory distress. She has no wheezes. She has no rales.  No crackles  Abdominal: Soft. Bowel sounds are normal. She exhibits no distension, no abdominal bruit and no mass. There is no tenderness.  Musculoskeletal: She exhibits no edema.  Lymphadenopathy:    She has no cervical adenopathy.  Neurological: She is alert. She has normal reflexes.  Skin: Skin is warm and dry. No rash noted.  Psychiatric: She has a normal mood and affect.          Assessment & Plan:   Problem List Items Addressed This Visit      Cardiovascular and Mediastinum   Essential hypertension - Primary    bp is trending up  Continue hctz  Stop canned soups / processed salty foods-given dash diet handout  Work on smoking cessation Add metoprolol xl 50 mg-watch bp and pulse and alert if side eff Lab today  F/u planned        Relevant Medications   metoprolol succinate (TOPROL-XL) 50 MG 24 hr tablet   Other Relevant Orders    Comprehensive metabolic panel (Completed)     Other   Hyperlipidemia    Disc goals for lipids and reasons to control them Rev labs with pt Rev low sat fat diet in detail       Relevant Medications   metoprolol succinate (TOPROL-XL) 50 MG 24 hr tablet   Other Relevant Orders   CBC with Differential/Platelet (Completed)   TOBACCO USE    Disc in detail risks of smoking and possible outcomes including copd, vascular/ heart disease, cancer , respiratory and sinus infections  Pt voices understanding She states she is not ready to quit yet

## 2016-06-16 NOTE — Patient Instructions (Signed)
Try to back off of canned soups and processed foods Lab today  Continue hctz  BP here is not as bad as home but it is trending up  Check bp at home when you are relaxed  Try to exercise when you can  Know that your cuff may run a little high  Drink a lot of water Think about quitting smoking   Follow up with me in 4-6 weeks

## 2016-06-17 NOTE — Assessment & Plan Note (Signed)
bp is trending up  Continue hctz  Stop canned soups / processed salty foods-given dash diet handout  Work on smoking cessation Add metoprolol xl 50 mg-watch bp and pulse and alert if side eff Lab today  F/u planned

## 2016-06-17 NOTE — Assessment & Plan Note (Signed)
Disc goals for lipids and reasons to control them Rev labs with pt Rev low sat fat diet in detail   

## 2016-06-17 NOTE — Assessment & Plan Note (Signed)
Disc in detail risks of smoking and possible outcomes including copd, vascular/ heart disease, cancer , respiratory and sinus infections  Pt voices understanding She states she is not ready to quit yet

## 2016-06-24 ENCOUNTER — Other Ambulatory Visit: Payer: Managed Care, Other (non HMO)

## 2016-07-13 ENCOUNTER — Encounter: Payer: Self-pay | Admitting: Internal Medicine

## 2016-07-13 ENCOUNTER — Ambulatory Visit (INDEPENDENT_AMBULATORY_CARE_PROVIDER_SITE_OTHER): Payer: Managed Care, Other (non HMO) | Admitting: Internal Medicine

## 2016-07-13 VITALS — BP 136/89 | HR 97 | Temp 97.7°F | Wt 206.0 lb

## 2016-07-13 DIAGNOSIS — D729 Disorder of white blood cells, unspecified: Secondary | ICD-10-CM

## 2016-07-13 DIAGNOSIS — G43009 Migraine without aura, not intractable, without status migrainosus: Secondary | ICD-10-CM | POA: Diagnosis not present

## 2016-07-13 DIAGNOSIS — N643 Galactorrhea not associated with childbirth: Secondary | ICD-10-CM

## 2016-07-13 DIAGNOSIS — O926 Galactorrhea: Secondary | ICD-10-CM | POA: Diagnosis not present

## 2016-07-13 LAB — CBC WITH DIFFERENTIAL/PLATELET
BASOS ABS: 0 {cells}/uL (ref 0–200)
Basophils Relative: 0 %
EOS PCT: 1 %
Eosinophils Absolute: 132 cells/uL (ref 15–500)
HCT: 45 % (ref 35.0–45.0)
HEMOGLOBIN: 14.4 g/dL (ref 11.7–15.5)
LYMPHS ABS: 4092 {cells}/uL — AB (ref 850–3900)
Lymphocytes Relative: 31 %
MCH: 25.3 pg — AB (ref 27.0–33.0)
MCHC: 32 g/dL (ref 32.0–36.0)
MCV: 79.1 fL — ABNORMAL LOW (ref 80.0–100.0)
MPV: 10.4 fL (ref 7.5–12.5)
Monocytes Absolute: 792 cells/uL (ref 200–950)
Monocytes Relative: 6 %
NEUTROS ABS: 8184 {cells}/uL — AB (ref 1500–7800)
Neutrophils Relative %: 62 %
Platelets: 269 10*3/uL (ref 140–400)
RBC: 5.69 MIL/uL — AB (ref 3.80–5.10)
RDW: 18 % — ABNORMAL HIGH (ref 11.0–15.0)
WBC: 13.2 10*3/uL — ABNORMAL HIGH (ref 3.8–10.8)

## 2016-07-13 NOTE — Progress Notes (Signed)
Patient ID: Michele Rojas, female   DOB: [birth year], 30 y.o.   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 She has followed by PCP every few months,  She had seen a hematology in July 2017. Did auto-immune process, checked smear, JAk-2 mutation, flow cytometry,  and peripheral blood smear. She was referred to [facility] for evaluate of dysfunctional bleeding, looking at endometrial bleeding ruling out endometrial cancer.  ROS: Galactorrhea since stopping breastfeeding 10 months ago :   HA-  Unilateral - sharp left eye pain, occasional scotoma? HA last 6-8hr.  2-3 times per week x 3 years. Takes ibuprofen with some relief  (Hx of migraines in college)  +weight gain = 176 in February now 203#   + fatigue, + depression  + blurry vision - despite new contacts 5 months  Past med hx:  Hx of MRSA,  htn Bartholin's cyst with recurrent infection Pre-eclampsia on 1st pregnancy in Nov 2015 born at 31 wk, spent 7 wk in NICU with TOF. Feb 2017- 2nd child, except for HTN, born at 56 wk, no breastfeeding EBV as a kid S/p chole Galactorrhea ([place], LH and prolactin levels tested on 9/20 - still post-partum picture)   Outpatient Encounter Prescriptions as of 07/13/2016  Medication Sig  . amphetamine-dextroamphetamine (ADDERALL) 10 MG tablet TAKE ONE TABLET BY MOUTH UP TO 6 TIMES DAILY  . Cholecalciferol (VITAMIN D3 PO) Take 1 capsule by mouth daily.  . hydrochlorothiazide (HYDRODIURIL) 25 MG tablet Take 1 tablet (25 mg total) by mouth daily.  Marland Kitchen levonorgestrel (MIRENA) 20 MCG/24HR IUD 1 each by Intrauterine route once.  . metoprolol succinate (TOPROL-XL) 50 MG 24 hr tablet Take 1 tablet (50 mg total) by mouth daily. Take with or immediately following a meal.  . TRINTELLIX 20 MG TABS Take 1 tablet by mouth daily.  . TURMERIC PO Take 1 capsule by mouth daily.   No facility-administered  encounter medications on file as of 07/13/2016.      Patient Active Problem List   Diagnosis Date Noted  . Routine general medical examination at a health care facility 05/25/2016  . Lipoma of lower extremity 03/08/2016  . Hyperlipidemia 02/22/2016  . Leukocytosis 02/20/2016  . 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 Essential hypertension 01/07/2016  . Placental abruption 09/23/2015  . Encounter for biometric screening 09/30/2013  . Screening for lipoid disorders 09/28/2013  . Diabetes mellitus screening 09/28/2013  . Hypothyroid 11/04/2011  . Depression 10/04/2011  . HEADACHE 11/18/2009  . Adjustment disorder with mixed anxiety and depressed mood 11/17/2009  . FATIGUE 10/06/2009  . TOBACCO USE 02/19/2009  . CHICKENPOX, HX OF 02/19/2009  . DEFICIENCY OF OTHER VITAMINS 04/29/2008  . OTHER CONSTIPATION 04/29/2008     There are no preventive care reminders to display for this patient.   Review of Systems Per hpi. Physical Exam   BP 136/89   Pulse 97   Temp 97.7 F (36.5 C) (Oral)   Wt 206 lb (93.4 kg)   SpO2 100%   BMI 32.75 kg/m  Physical Exam  Constitutional:  oriented to person, place, and time. appears well-developed and well-nourished. No distress.  HENT: /AT, PERRLA, no scleral icterus Mouth/Throat: Oropharynx is clear and moist. No oropharyngeal exudate.  Cardiovascular: Normal rate, regular rhythm and normal heart sounds. Exam reveals no gallop and no friction rub.  No murmur heard.  Pulmonary/Chest: Effort normal and breath sounds normal.  No respiratory distress.  has no wheezes.  Neck = supple, no nuchal rigidity Abdominal: Soft. Bowel sounds are normal.  exhibits no distension. There is no tenderness.  Lymphadenopathy: no cervical adenopathy. No axillary adenopathy Neurological: alert and oriented to person, place, and time.  Skin: Skin is warm and dry. Hyperpigmented rash beneath breast,  Psychiatric: a normal mood and affect.  behavior is normal.    CBC Lab Results  Component Value Date   WBC 17.0 (H) 06/16/2016   RBC 5.81 (H) 06/16/2016   HGB 14.9 06/16/2016   HCT 45.4 06/16/2016   PLT 276.0 06/16/2016   MCV 78.3 06/16/2016   MCH 25.2 03/22/2016   MCHC 32.7 06/16/2016   RDW 19.0 (H) 06/16/2016   LYMPHSABS 5.2 (H) 06/16/2016   MONOABS 0.7 06/16/2016   EOSABS 0.1 06/16/2016   BASOSABS 0.1 06/16/2016   BMET Lab Results  Component Value Date   NA 136 06/16/2016   K 3.9 06/16/2016   CL 97 06/16/2016   CO2 33 (H) 06/16/2016   GLUCOSE 90 06/16/2016   BUN 9 06/16/2016   CREATININE 0.75 06/16/2016   CALCIUM 9.9 06/16/2016   GFRNONAA >60 09/22/2015   GFRAA >60 09/22/2015   Lab Results  Component Value Date   ESRSEDRATE 12 07/13/2016    Assessment and Plan  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 She has seen a hematologist who felt this was consistent with inflammation but have not identified root cause. Her recent work up for gyn cancer was negative. We will check sed rate to see if elevated at this visit. This may most likely be idiopathic and may need to monitor chronically every 3-6 months with cbc.  Breast milk production = now she is roughly 10 months out still with breast milk, will check prolactin, FSH, LH  Unilateral migraine = will refer to neurology and get brain mri for baseline Imaging  Neuro-opthos referral may be in her future pending neurology evaluation  Addendum: mri was declined by insurance. Will defer to neurology if still needed in work up of her migraine history

## 2016-07-14 LAB — SEDIMENTATION RATE: Sed Rate: 12 mm/hr (ref 0–20)

## 2016-07-14 LAB — FOLLICLE STIMULATING HORMONE: FSH: 4.6 m[IU]/mL

## 2016-07-14 LAB — LUTEINIZING HORMONE: LH: 3 m[IU]/mL

## 2016-07-14 LAB — PROLACTIN: Prolactin: 6.4 ng/mL

## 2016-07-14 LAB — C-REACTIVE PROTEIN: CRP: 5.6 mg/L (ref <8.0)

## 2016-07-19 ENCOUNTER — Encounter: Payer: Self-pay | Admitting: Internal Medicine

## 2016-07-19 NOTE — Telephone Encounter (Signed)
MRI scheduled 12/19 at [place].  Prior Authorization requested for MRI of the brain with/without contrast.  Case number [id number] pending. Needs clinicals faxed to [phone] for further review. Faxed.  Should have answer in 2 business days. Landis Gandy, RN

## 2016-07-22 ENCOUNTER — Encounter: Payer: Self-pay | Admitting: *Deleted

## 2016-07-22 ENCOUNTER — Telehealth: Payer: Self-pay | Admitting: *Deleted

## 2016-07-22 NOTE — Telephone Encounter (Signed)
MRI Approved [id number] effective 12/11 - 10/17/16.

## 2016-07-27 ENCOUNTER — Ambulatory Visit
Admission: RE | Admit: 2016-07-27 | Discharge: 2016-07-27 | Disposition: A | Discharge status: Home / Self Care | Payer: Managed Care, Other (non HMO) | Source: Ambulatory Visit | Attending: Internal Medicine | Admitting: Internal Medicine

## 2016-07-27 ENCOUNTER — Ambulatory Visit: Payer: Managed Care, Other (non HMO) | Admitting: Family Medicine

## 2016-07-27 DIAGNOSIS — N643 Galactorrhea not associated with childbirth: Secondary | ICD-10-CM

## 2016-07-27 MED ORDER — GADOBENATE DIMEGLUMINE 529 MG/ML IV SOLN
10.0000 mL | Freq: Once | INTRAVENOUS | Status: AC | PRN
  Administered 2016-07-27: 10 mL via INTRAVENOUS

## 2016-08-12 ENCOUNTER — Encounter: Payer: Self-pay | Admitting: Internal Medicine

## 2016-08-12 ENCOUNTER — Ambulatory Visit (INDEPENDENT_AMBULATORY_CARE_PROVIDER_SITE_OTHER): Payer: Managed Care, Other (non HMO) | Admitting: Internal Medicine

## 2016-08-12 VITALS — BP 154/97 | HR 108 | Temp 98.4°F | Ht 66.0 in | Wt 212.0 lb

## 2016-08-12 DIAGNOSIS — R635 Abnormal weight gain: Secondary | ICD-10-CM

## 2016-08-12 LAB — URINALYSIS, ROUTINE W REFLEX MICROSCOPIC
Bilirubin Urine: NEGATIVE
Glucose, UA: NEGATIVE
Hgb urine dipstick: NEGATIVE
Ketones, ur: NEGATIVE
LEUKOCYTES UA: NEGATIVE
NITRITE: NEGATIVE
PH: 6 (ref 5.0–8.0)
Protein, ur: NEGATIVE
SPECIFIC GRAVITY, URINE: 1.021 (ref 1.001–1.035)

## 2016-08-12 LAB — HEMOGLOBIN A1C
Hgb A1c MFr Bld: 5.5 % (ref <5.7)
MEAN PLASMA GLUCOSE: 111 mg/dL

## 2016-08-12 NOTE — Progress Notes (Signed)
RFV: follow up on leukocytosis  Patient ID: Michele Rojas, female   DOB: [birth year], 31 y.o.   MRN: [id number]  HPI She was seen in early December as new referral for chronic leukocytosis for which was mostly primary neutrophilia. She already had extensive work up by oncology at [facility]. Including flow cytometry, rule out for endometrial cancer. She also reported in ROS having chronical headaches as well galactorhea despite stopping BF rougly 10 months ago. In addition she has had significant weight gain over the last year. I had referred her to neurology for work up of HA, given concern for galactorrhea, chronic headaches including waking up with headache, MRI of brain was done which was normal  Outpatient Encounter Prescriptions as of 08/12/2016  Medication Sig  . amphetamine-dextroamphetamine (ADDERALL) 10 MG tablet TAKE ONE TABLET BY MOUTH UP TO 6 TIMES DAILY  . Cholecalciferol (VITAMIN D3 PO) Take 1 capsule by mouth daily.  . hydrochlorothiazide (HYDRODIURIL) 25 MG tablet Take 1 tablet (25 mg total) by mouth daily.  Marland Kitchen levonorgestrel (MIRENA) 20 MCG/24HR IUD 1 each by Intrauterine route once.  . TRINTELLIX 20 MG TABS Take 1 tablet by mouth daily.  . [DISCONTINUED] metoprolol succinate (TOPROL-XL) 50 MG 24 hr tablet Take 1 tablet (50 mg total) by mouth daily. Take with or immediately following a meal. (Patient not taking: Reported on 08/12/2016)  . [DISCONTINUED] TURMERIC PO Take 1 capsule by mouth daily.   No facility-administered encounter medications on file as of 08/12/2016.      Patient Active Problem List   Diagnosis Date Noted  . Routine general medical examination at a health care facility 05/25/2016  . Lipoma of lower extremity 03/08/2016  . Hyperlipidemia 02/22/2016  . Leukocytosis 02/20/2016  . 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 Essential hypertension 01/07/2016  . Placental abruption 09/23/2015  . Encounter for biometric screening 09/30/2013  . Screening for lipoid  disorders 09/28/2013  . Diabetes mellitus screening 09/28/2013  . Hypothyroid 11/04/2011  . Depression 10/04/2011  . HEADACHE 11/18/2009  . Adjustment disorder with mixed anxiety and depressed mood 11/17/2009  . FATIGUE 10/06/2009  . TOBACCO USE 02/19/2009  . CHICKENPOX, HX OF 02/19/2009  . DEFICIENCY OF OTHER VITAMINS 04/29/2008  . OTHER CONSTIPATION 04/29/2008     There are no preventive care reminders to display for this patient.   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 Left shoulder pain that radiates up to neck Physical Exam   BP (!) 154/97   Pulse (!) 108   Temp 98.4 F (36.9 C) (Oral)   Ht 5\' 6"  (1.676 m)   Wt 212 lb (96.2 kg)   BMI 34.22 kg/m   Physical Exam  Constitutional:  oriented to person, place, and time. appears well-developed and well-nourished. No distress.  HENT: [place]/AT, PERRLA, no scleral icterus Mouth/Throat: Oropharynx is clear and moist. No oropharyngeal exudate.  Cardiovascular: Normal rate, regular rhythm and normal heart sounds. Exam reveals no gallop and no friction rub.  No murmur heard.  Pulmonary/Chest: Effort normal and breath sounds normal. No respiratory distress.  has no wheezes.  Neck = supple, no nuchal rigidity Abdominal: Soft. Bowel sounds are normal.  exhibits no distension. There is no tenderness.  Lymphadenopathy: no cervical adenopathy. No axillary adenopathy Neurological: alert and oriented to person, place, and time. Full range of motion to left shoulder Skin: Skin is warm and dry. No rash noted. No erythema.  Psychiatric: a normal mood and affect.  behavior is normal.  Lab Results  Component Value Date   LABRPR NON REAC 06/09/2014    CBC Lab Results  Component Value Date   WBC 13.2 (H) 07/13/2016   RBC 5.69 (H) 07/13/2016   HGB 14.4 07/13/2016   HCT 45.0 07/13/2016   PLT 269 07/13/2016   MCV 79.1 (L) 07/13/2016   MCH 25.3 (L) 07/13/2016   MCHC 32.0 07/13/2016   RDW 18.0 (H)  07/13/2016   LYMPHSABS 4,092 (H) 07/13/2016   MONOABS 792 07/13/2016   EOSABS 132 07/13/2016    BMET Lab Results  Component Value Date   NA 136 06/16/2016   K 3.9 06/16/2016   CL 97 06/16/2016   CO2 33 (H) 06/16/2016   GLUCOSE 90 06/16/2016   BUN 9 06/16/2016   CREATININE 0.75 06/16/2016   CALCIUM 9.9 06/16/2016   GFRNONAA >60 09/22/2015   GFRAA >60 09/22/2015      Assessment and Plan  Leukocytosis = no further work up needed  Headache = referred to neurology to help with chronic headache and migrain management. Unsure if any link to her unintentional weight gain  Unintentional weight gain? = cushings? Will check thyroid and DM. Recommend to see PCP for further work up. May need endocrinologist? She checked with psychiatry that her trintellix is not associated with weight gian

## 2016-08-18 ENCOUNTER — Ambulatory Visit (INDEPENDENT_AMBULATORY_CARE_PROVIDER_SITE_OTHER): Payer: Managed Care, Other (non HMO) | Admitting: Primary Care

## 2016-08-18 ENCOUNTER — Encounter: Payer: Self-pay | Admitting: Primary Care

## 2016-08-18 VITALS — BP 154/96 | HR 99 | Temp 98.2°F | Ht 66.0 in | Wt 212.1 lb

## 2016-08-18 DIAGNOSIS — J029 Acute pharyngitis, unspecified: Secondary | ICD-10-CM

## 2016-08-18 LAB — POCT RAPID STREP A (OFFICE): Rapid Strep A Screen: NEGATIVE

## 2016-08-18 MED ORDER — LIDOCAINE VISCOUS 2 % MT SOLN: OROMUCOSAL | 0 refills | Status: DC

## 2016-08-18 NOTE — Progress Notes (Signed)
Subjective:    Patient ID: Michele Rojas, female    DOB: 12/18/1985, 31 y.o.   MRN: [id number]  HPI  Michele Rojas is a 31 year old female with a history of chronic leukocytosis, essential hypertension who presents today with a chief complaint of sore throat. Her sore throat has been present for the past 1 week, but has noticed increased pain over the last 24 hours. She's also now developed right ear pain, rhinorrhea, body aches, intermittent low grade fever. She's taken ibuprofen with temporary improvement. She denies cough, congestion, headaches, nausea.  Review of Systems  Constitutional: Negative for fever.  HENT: Positive for ear pain and rhinorrhea. Negative for congestion.   Respiratory: Negative for cough.   Gastrointestinal: Negative for nausea.  Musculoskeletal: Positive for myalgias.  Neurological: Negative for headaches.       Past Medical History:  Diagnosis Date  . ADD (attention deficit disorder)    no meds  . Constipation   . Depression   . Fatigue   . Headache    migraines  . History of chicken pox as a child  . History of preterm delivery    2015- 31wks, 2017- 35wks  . HPV (human papilloma virus) infection   . Hx of abnormal cervical Pap smear    ASCUS, LGSIL, CIN I, Colpo  . Hypothyroidism    no meds  . Mood swings ([place])   . Preeclampsia    2015 & 2017 pregnancies  . Smoker   . Tobacco abuse   . Vitamin deficiency      Social History   Social History  . Marital status: Married    Spouse name: N/A  . Number of children: N/A  . Years of education: N/A   Occupational History  . Not on file.   Social History Main Topics  . Smoking status: Current Every Day Smoker    Packs/day: 1.00    Types: Cigarettes  . Smokeless tobacco: Never Used  . Alcohol use No     Comment: socially  . Drug use: No  . Sexual activity: Yes   Other Topics Concern  . Not on file   Social History Narrative   GYN- Dr. 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 ACHILLES TENDON SURGERY    . BARTHOLIN GLAND CYST EXCISION    . CESAREAN SECTION N/A 06/09/2014   Procedure: CESAREAN SECTION;  Surgeon: Farrel Gobble. Harrington Challenger, MD;  Location: [facility];  Service: Obstetrics;  Laterality: N/A;  . CESAREAN SECTION N/A 09/22/2015   Procedure: CESAREAN SECTION;  Surgeon: Jerelyn Charles, MD;  Location: [facility];  Service: Obstetrics;  Laterality: N/A;  . CHOLECYSTECTOMY, LAPAROSCOPIC    . WISDOM TOOTH EXTRACTION      Family History  Problem Relation Age of Onset  . Hypertension Father   . Hyperlipidemia Father   . Aneurysm Maternal Grandfather   . Depression Mother     after a car accident  . Alzheimer's disease Other   . Depression Brother     major depression  . Graves' disease Paternal Uncle     Allergies  Allergen Reactions  . Amoxicillin Other (See Comments)    REACTION: yeast infection in mouth Has patient had a PCN reaction causing immediate rash, facial/tongue/throat swelling, SOB or lightheadedness with  hypotension: No Has patient had a PCN reaction causing severe rash involving mucus membranes or skin necrosis: No Has patient had a PCN reaction that required hospitalization no Has patient had a PCN reaction occurring within the last 10 years: Yes If all of the above answers are "NO", then may proceed with Cephalosporin use.    Current Outpatient Prescriptions on File Prior to Visit  Medication Sig Dispense Refill  . amphetamine-dextroamphetamine (ADDERALL) 10 MG tablet TAKE ONE TABLET BY MOUTH UP TO 6 TIMES DAILY  0  . TRINTELLIX 20 MG TABS Take 1 tablet by mouth daily.  0   No current facility-administered medications on file prior to visit.     BP (!) 154/96   Pulse 99   Temp 98.2 F (36.8 C) (Oral)   Ht 5\' 6"  (1.676 m)   Wt 212 lb 1.9 oz (96.2 kg)   SpO2 99%   BMI 34.24 kg/m     Objective:   Physical Exam  Constitutional: She appears well-nourished.  HENT:  Nose: Mucosal edema present.  Mouth/Throat: Posterior oropharyngeal erythema present. No oropharyngeal exudate or posterior oropharyngeal edema.  Cardiovascular: Normal rate and regular rhythm.   Pulmonary/Chest: Effort normal and breath sounds normal.          Assessment & Plan:  Sore Throat:  Also with rhinorrhea, body aches x 1 week. Starting to feel worse now. Some improvement with OTC treatment. Exam today with mild-moderate erythema, cobble stoning to posterior pharynx. She doesn't appear acutely ill. Rapid Strep: Negative. Do not suspect influenza based off of vitals and presentation. Suspect viral URI and will treat with supportive measures. Rx for viscous lidocaine sent to pharmacy.  Strict return precautions provided.  Sheral Flow, NP

## 2016-08-18 NOTE — Patient Instructions (Signed)
Your strep test was negative.  Your symptoms are representative of a viral illness which will resolve on its own over time. Our goal is to treat your symptoms in order to aid your body in the healing process and to make you more comfortable.   Start viscous lidocaine for sore throat. Gargle and swallow 10 ml every 8 hours as needed for sore throat.  Please notify me if you develop fevers of 101, you start to feel worse, you notice increased swelling.  It was a pleasure meeting you!

## 2016-08-18 NOTE — Progress Notes (Signed)
Pre visit review using our clinic review tool, if applicable. No additional management support is needed unless otherwise documented below in the visit note. 

## 2016-08-20 ENCOUNTER — Ambulatory Visit: Payer: Self-pay | Admitting: Family Medicine

## 2016-08-27 ENCOUNTER — Ambulatory Visit: Payer: Managed Care, Other (non HMO) | Admitting: Neurology

## 2017-06-03 ENCOUNTER — Ambulatory Visit: Payer: Managed Care, Other (non HMO)

## 2017-06-09 HISTORY — PX: BONE MARROW BIOPSY: SHX199

## 2017-06-24 ENCOUNTER — Ambulatory Visit: Payer: Self-pay | Admitting: *Deleted

## 2017-06-24 NOTE — Telephone Encounter (Signed)
Called in c/o pain under her right rib with nausea.   The pain started last night.   The nausea started this morning.   She usually takes Zantac daily for indigestion but did not take it yesterday or today.   She does not a gallbladder. The agent had tried to get her an appt with [facility] and [facility] for today without any availability. She has decided to go to the closest urgent care.  Instructed her to call back if she becomes worse or things don't work out with the urgent care.  Reason for Disposition . [1] MILD-MODERATE pain AND [2] constant AND [3] present > 2 hours  Answer Assessment - Initial Assessment Questions 1. LOCATION: "Where does it hurt?"      It started last night hurting under my right rib at bedtime. 2. RADIATION: "Does the pain shoot anywhere else?" (e.g., chest, back)     I feel it on my side. 3. ONSET: "When did the pain begin?" (e.g., minutes, hours or days ago)      Last night at bedtime. 4. SUDDEN: "Gradual or sudden onset?"     Gradually started.   Not a sharp pain.  A constant dull pain. 5. PATTERN "Does the pain come and go, or is it constant?"    - If constant: "Is it getting better, staying the same, or worsening?"      (Note: Constant means the pain never goes away completely; most serious pain is constant and it progresses)     - If intermittent: "How long does it last?" "Do you have pain now?"     (Note: Intermittent means the pain goes away completely between bouts)     Constant.  Nausea that started this morning. 6. SEVERITY: "How bad is the pain?"  (e.g., Scale 1-10; mild, moderate, or severe)    - MILD (1-3): doesn't interfere with normal activities, abdomen soft and not tender to touch     - MODERATE (4-7): interferes with normal activities or awakens from sleep, tender to touch     - SEVERE (8-10): excruciating pain, doubled over, unable to do any normal activities       4 Moderate 7. RECURRENT SYMPTOM: "Have you ever had this  type of abdominal pain before?" If so, ask: "When was the last time?" and "What happened that time?"      No 8. AGGRAVATING FACTORS: "Does anything seem to cause this pain?" (e.g., foods, stress, alcohol)     I've not eaten. 9. CARDIAC SYMPTOMS: "Do you have any of the following symptoms: chest pain, difficulty breathing, sweating, nausea?"     I have high BP.   I was on med. But stopped it.   I'm going back to my primary about getting back on the BP.    10. OTHER SYMPTOMS: "Do you have any other symptoms?" (e.g., fever, vomiting, diarrhea)       No fever, chills.   No diarrhea 11. PREGNANCY: "Is there any chance you are pregnant?" "When was your last menstrual period?"       No Last period around the 1th. Seeing a specialist a [facility] for a high white blood count.   Had a bone marrow bx a week ago.  Protocols used: ABDOMINAL PAIN - UPPER-A-AH

## 2017-07-28 ENCOUNTER — Telehealth: Payer: Self-pay

## 2017-07-28 DIAGNOSIS — R635 Abnormal weight gain: Secondary | ICD-10-CM | POA: Insufficient documentation

## 2017-07-28 NOTE — Telephone Encounter (Signed)
Michele Rojas-these need to be done first thing in am, correct? And we need to be open the next day to get 24 test back ?

## 2017-07-28 NOTE — Telephone Encounter (Signed)
Draw blood work before 9:00 am, then a 24 hr urine. Doesn't have to fast

## 2017-07-28 NOTE — Telephone Encounter (Signed)
.   Pt needs labs; ACTH, Cortisol level, 24 hr urine cortisol level and 24 hr urine creatinine level.-- ordered Please schedule lab  Thanks!

## 2017-07-28 NOTE — Telephone Encounter (Signed)
Left VM requesting pt to call the office back CRM created 

## 2017-07-28 NOTE — Addendum Note (Signed)
Addended by: Loura Pardon A on: 07/28/2017 04:34 PM   Modules accepted: Orders

## 2017-07-28 NOTE — Telephone Encounter (Signed)
Dr Crissie Figures, Hematologist at [facility] called; pt has had testing including bone marrow biopsy that was inconclusive; ? Cushing disease. Tried to refer pt to [facility] endocrinology but they require baseline endocrine testing before will accept referral. Pt needs labs; ACTH, Cortisol level, 24 hr urine cortisol level and 24 hr urine creatinine level. If Dr Glori Bickers will order lab test contact pt. Dr Glori Bickers can contact Dr Crissie Figures if needed.

## 2017-08-04 NOTE — Telephone Encounter (Signed)
Left VM requesting pt to call the office back 

## 2017-08-11 NOTE — Telephone Encounter (Signed)
Left final VM requesting pt to call the office. I also called Dr. Graylon Good office and left a VM letting them know that Dr. Glori Bickers ordered labs but we can't get in touch with pt so if they would like pt to still have labs then have her call our office and schedule a lab appt

## 2017-08-16 ENCOUNTER — Encounter: Payer: Self-pay | Admitting: Family Medicine

## 2017-08-16 ENCOUNTER — Encounter: Payer: Self-pay | Admitting: Internal Medicine

## 2017-08-16 ENCOUNTER — Other Ambulatory Visit (INDEPENDENT_AMBULATORY_CARE_PROVIDER_SITE_OTHER): Payer: Managed Care, Other (non HMO)

## 2017-08-16 ENCOUNTER — Other Ambulatory Visit: Payer: Managed Care, Other (non HMO)

## 2017-08-16 DIAGNOSIS — R635 Abnormal weight gain: Secondary | ICD-10-CM | POA: Diagnosis not present

## 2017-08-16 LAB — TIQ-NTM

## 2017-08-17 ENCOUNTER — Other Ambulatory Visit: Payer: Self-pay | Admitting: Family Medicine

## 2017-08-17 DIAGNOSIS — R635 Abnormal weight gain: Secondary | ICD-10-CM

## 2017-08-17 LAB — CORTISOL-AM, BLOOD: CORTISOL - AM: 22 ug/dL

## 2017-08-18 ENCOUNTER — Telehealth: Payer: Self-pay | Admitting: Family Medicine

## 2017-08-18 ENCOUNTER — Other Ambulatory Visit (INDEPENDENT_AMBULATORY_CARE_PROVIDER_SITE_OTHER): Payer: Managed Care, Other (non HMO)

## 2017-08-18 DIAGNOSIS — R635 Abnormal weight gain: Secondary | ICD-10-CM

## 2017-08-18 LAB — ACTH

## 2017-08-18 NOTE — Telephone Encounter (Signed)
Patient has already came back for a redraw.

## 2017-08-18 NOTE — Telephone Encounter (Signed)
Copied from [place] [id number]. Topic: Quick Communication - See Telephone Encounter >> Aug 18, 2017 10:47 AM Boyd Kerbs wrote: CRM for notification. See Telephone encounter for:   Frazier Richards from [facility] [phone] called saying the specimen received from 08/16/17 was not received frozen and checked and was not sent frozen.   Pt will need to have this recollected.  Spoke to [employer] yesterday about this.   08/18/17.

## 2017-08-23 LAB — ACTH: C206 ACTH: 21 pg/mL (ref 6–50)

## 2017-08-23 LAB — CORTISOL, URINE, 24 HOUR
24 HOUR URINE VOLUME (VMAHVA): 1650 mL
Cortisol (Ur), Free: 17.2 mcg/24 h (ref 4.0–50.0)
RESULTS RECEIVED: 1.25 g/(24.h) (ref 0.50–2.15)

## 2017-09-20 ENCOUNTER — Encounter: Payer: Self-pay | Admitting: Internal Medicine

## 2017-09-20 ENCOUNTER — Ambulatory Visit: Payer: Managed Care, Other (non HMO) | Admitting: Internal Medicine

## 2017-09-20 VITALS — BP 130/86 | HR 104 | Ht 67.0 in | Wt 217.5 lb

## 2017-09-20 DIAGNOSIS — R935 Abnormal findings on diagnostic imaging of other abdominal regions, including retroperitoneum: Secondary | ICD-10-CM

## 2017-09-20 DIAGNOSIS — R1031 Right lower quadrant pain: Secondary | ICD-10-CM

## 2017-09-20 DIAGNOSIS — Z8719 Personal history of other diseases of the digestive system: Secondary | ICD-10-CM

## 2017-09-20 DIAGNOSIS — R945 Abnormal results of liver function studies: Secondary | ICD-10-CM | POA: Diagnosis not present

## 2017-09-20 DIAGNOSIS — K760 Fatty (change of) liver, not elsewhere classified: Secondary | ICD-10-CM | POA: Diagnosis not present

## 2017-09-20 DIAGNOSIS — R7989 Other specified abnormal findings of blood chemistry: Secondary | ICD-10-CM

## 2017-09-20 NOTE — Progress Notes (Signed)
HISTORY OF PRESENT ILLNESS:  Michele Rojas is a 32 y.o. 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 Glori Bickers with chief complaint of abdominal pain and abnormal CT scan. The patient reports being in her usual state of health until mid December when she developed abdominal pain. She was seen in the emergency room and told that she had constipation and fecal impaction despite the fact that she moves her bowels daily. She was reevaluated 1 week later with worsening abdominal pain, particularly on the right. Contrast-enhanced CT scan of the abdomen and pelvis was performed at [facility] 07/29/2017. Examination revealed findings compatible with Meckel's diverticulitis. Incidental findings included fatty liver, prior cholecystectomy, and minimal ascites. Patient was treated with antibiotics and pain medication. She states that her symptoms improved within one week without recurrence. She denies a prior history of similar problems. Review of blood work from November 2018 finds a unremarkable comprehensive metabolic panel except for alkaline phosphatase 134 and ALT 52. Hemoglobin 16.5. Previous laboratories 07/13/2016 show what blood cell count 13.2 and hemoglobin 14.4. Liver tests November 2017 are normal. Patient does report occasional indigestion, bloating, and alternating bowel habits. She is aware that she has had elevated liver enzymes  REVIEW OF SYSTEMS:  All non-GI ROS negative unless otherwise stated in the history of present illness except for back pain, confusion, fatigue, muscle pains, night sweats, skin rash, ankle swelling  Past Medical History:  Diagnosis Date  . ADD (attention deficit disorder)    no meds  . Anxiety   . Constipation   . Depression   . Fatigue   . Fatty liver   . Gallstones   . Headache    migraines  . History of chicken pox as a child  .  History of preterm delivery    2015- 31wks, 2017- 35wks  . HLD (hyperlipidemia)   . HPV (human papilloma virus) infection   . HTN (hypertension)   . Hx of abnormal cervical Pap smear    ASCUS, LGSIL, CIN I, Colpo  . Hypothyroidism    no meds  . Meckel's diverticulitis   . Mood swings   . Preeclampsia    2015 & 2017 pregnancies  . Smoker   . Tobacco abuse   . Vitamin deficiency     Past Surgical History:  Procedure Laterality Date  . ACHILLES TENDON SURGERY Bilateral   . BARTHOLIN GLAND CYST EXCISION    . CESAREAN SECTION N/A 06/09/2014   Procedure: CESAREAN SECTION;  Surgeon: Farrel Gobble. Harrington Challenger, MD;  Location: [facility];  Service: Obstetrics;  Laterality: N/A;  . CESAREAN SECTION N/A 09/22/2015   Procedure: CESAREAN SECTION;  Surgeon: Jerelyn Charles, MD;  Location: [facility];  Service: Obstetrics;  Laterality: N/A;  . CHOLECYSTECTOMY, LAPAROSCOPIC    . TUBAL LIGATION    . [place]  reports that she has been smoking cigarettes.  She has been smoking about 1.00 pack per Rojas. she has never used smokeless tobacco. She reports that she does not drink alcohol or use drugs.  family history includes Alzheimer's disease in her paternal grandfather and paternal grandmother; Aneurysm in her maternal grandfather; Celiac disease in her cousin; Colon polyps in her mother; Depression in her brother and mother; Berenice Primas' disease in her paternal uncle; Hyperlipidemia in her father; Hypertension in her father; Other  in her mother; Prostate cancer in her father.  Allergies  Allergen Reactions  . Amoxicillin Other (See Comments)    REACTION: yeast infection in mouth Has patient had a PCN reaction causing immediate rash, facial/tongue/throat swelling, SOB or lightheadedness with hypotension: No Has patient had a PCN reaction causing severe rash involving mucus membranes or skin necrosis: No Has patient had a PCN reaction that required hospitalization no Has  patient had a PCN reaction occurring within the last 10 years: Yes If all of the above answers are "NO", then may proceed with Cephalosporin use.       PHYSICAL EXAMINATION: Vital signs: BP 130/86 (BP Location: Left Arm, Patient Position: Sitting, Cuff Size: Normal)   Pulse (!) 104   Ht 5\' 7"  (1.702 m) Comment: height measured without shoes  Wt 217 lb 8 oz (98.7 kg)   BMI 34.07 kg/m   Constitutional: generally well-appearing, no acute distress Psychiatric: alert and oriented x3, cooperative Eyes: extraocular movements intact, anicteric, conjunctiva pink Mouth: oral pharynx moist, no lesions Neck: supple no lymphadenopathy Cardiovascular: heart regular rate and rhythm, no murmur Lungs: clear to auscultation bilaterally Abdomen: soft, obese, right lower quadrant tenderness to palpation without rebound, nondistended, no obvious ascites, no peritoneal signs, normal bowel sounds, no organomegaly Rectal: Omitted Extremities: no other, cyanosis, or lower extremity edema bilaterally Skin: no lesions on visible extremities Neuro: No focal deficits. Cranial nerves intact  ASSESSMENT:  #1. Possible Meckel's diverticulitis. If so, would be referred for surgical resection. Other differential considerations include appendicitis and inflammatory bowel disease #2. Right lower quadrant pain on today's examination. Rule out ongoing inflammatory process #3. Fatty liver on imaging #4. Elevated liver tests likely secondary to fatty liver #5. Obesity   PLAN:  #1. Contrast-enhanced CT scan of the abdomen and pelvis #2. Consider Meckel scan, though only ectopic gastric mucosa only present in 25% of cases #3. Exercise and weight loss #4. Further recommendations after the above  A copy of this consultation note has been sent to Dr. Glori Bickers

## 2017-09-20 NOTE — Patient Instructions (Addendum)
You have been scheduled for a CT scan of the abdomen and pelvis at [facility] ([address]---this is in the same building as [facility]).   You are scheduled on 09/29/2017 at 1:30pm. You should arrive 15 minutes prior to your appointment time for registration. Please follow the written instructions below on the day of your exam:  WARNING: IF YOU ARE ALLERGIC TO IODINE/X-RAY DYE, PLEASE NOTIFY RADIOLOGY IMMEDIATELY AT [phone]! YOU WILL BE GIVEN A 13 HOUR PREMEDICATION PREP.  1) Do not eat anything after 9:30am (4 hours prior to your test) 2) You have been given 2 bottles of oral contrast to drink. The solution may taste               better if refrigerated, but do NOT add ice or any other liquid to this solution. Shake well before drinking.    Drink 1 bottle of contrast @ 11:30am,1 (2 hours prior to your exam)  Drink 1 bottle of contrast @ 12:30pm (1 hour prior to your exam)  You may take any medications as prescribed with a small amount of water except for the following: Metformin, Glucophage, Glucovance, Avandamet, Riomet, Fortamet, Actoplus Met, Janumet, Glumetza or Metaglip. The above medications must be held the day of the exam AND 48 hours after the exam.  The purpose of you drinking the oral contrast is to aid in the visualization of your intestinal tract. The contrast solution may cause some diarrhea. Before your exam is started, you will be given a small amount of fluid to drink. Depending on your individual set of symptoms, you may also receive an intravenous injection of x-ray contrast/dye. Plan on being at [facility] for 30 minutes or long, depending on the type of exam you are having performed.  If you have any questions regarding your exam or if you need to reschedule, you may call the CT department at [phone] between the hours of 8:00 am and 5:00 pm,  Monday-Friday.  ________________________________________________________________________

## 2017-09-29 ENCOUNTER — Ambulatory Visit (INDEPENDENT_AMBULATORY_CARE_PROVIDER_SITE_OTHER)
Admission: RE | Admit: 2017-09-29 | Discharge: 2017-09-29 | Disposition: A | Discharge status: Home / Self Care | Payer: Managed Care, Other (non HMO) | Source: Ambulatory Visit | Attending: Internal Medicine | Admitting: Internal Medicine

## 2017-09-29 DIAGNOSIS — R1031 Right lower quadrant pain: Secondary | ICD-10-CM | POA: Diagnosis not present

## 2017-09-29 DIAGNOSIS — Z8719 Personal history of other diseases of the digestive system: Secondary | ICD-10-CM

## 2017-09-29 MED ORDER — IOPAMIDOL (ISOVUE-300) INJECTION 61%
100.0000 mL | Freq: Once | INTRAVENOUS | Status: AC | PRN
  Administered 2017-09-29: 100 mL via INTRAVENOUS

## 2017-10-20 ENCOUNTER — Ambulatory Visit: Payer: Self-pay | Admitting: General Surgery

## 2017-10-26 ENCOUNTER — Encounter (HOSPITAL_COMMUNITY): Payer: Self-pay

## 2017-10-26 NOTE — Patient Instructions (Addendum)
Your procedure is scheduled on: Wednesday, November 02, 2017   Surgery Time:  8:30AM-10:00AM   Report to [facility] Main  Entrance    Report to admitting at 6:30 AM   Call this number if you have problems the morning of surgery [phone]   Do not eat food or drink liquids :After Midnight.   Do NOT smoke after Midnight   Complete one Ensure drink the morning of surgery 3 hours before surgery by 5:30AM   Take these medicines the morning of surgery with A SIP OF WATER: Bupropion                               You may not have any metal on your body including hair pins, jewelry, and body piercings             Do not wear make-up, lotions, powders, perfumes/cologne, or deodorant             Do not wear nail polish.  Do not shave  48 hours prior to surgery.             Do not bring valuables to the hospital. [place].   Contacts, dentures or bridgework may not be worn into surgery.   Patients discharged the day of surgery will not be allowed to drive home.   Name and phone number of your driver:                Please read over the following fact sheets you were given:  [facility] - Preparing for Surgery Before surgery, you can play an important role.  Because skin is not sterile, your skin needs to be as free of germs as possible.  You can reduce the number of germs on your skin by washing with CHG (chlorahexidine gluconate) soap before surgery.  CHG is an antiseptic cleaner which kills germs and bonds with the skin to continue killing germs even after washing. Please DO NOT use if you have an allergy to CHG or antibacterial soaps.  If your skin becomes reddened/irritated stop using the CHG and inform your nurse when you arrive at Short Stay. Do not shave (including legs and underarms) for at least 48 hours prior to the first CHG shower.  You may shave your face/neck.  Please follow these instructions carefully:  1.   Shower with CHG Soap the night before surgery and the  morning of surgery.  2.  If you choose to wash your hair, wash your hair first as usual with your normal  shampoo.  3.  After you shampoo, rinse your hair and body thoroughly to remove the shampoo.                             4.  Use CHG as you would any other liquid soap.  You can apply chg directly to the skin and wash.  Gently with a scrungie or clean washcloth.  5.  Apply the CHG Soap to your body ONLY FROM THE NECK DOWN.   Do   not use on face/ open                           Wound or open sores. Avoid  contact with eyes, ears mouth and   genitals (private parts).                       Wash face,  Genitals (private parts) with your normal soap.             6.  Wash thoroughly, paying special attention to the area where your    surgery  will be performed.  7.  Thoroughly rinse your body with warm water from the neck down.  8.  DO NOT shower/wash with your normal soap after using and rinsing off the CHG Soap.                9.  Pat yourself dry with a clean towel.            10.  Wear clean pajamas.            11.  Place clean sheets on your bed the night of your first shower and do not  sleep with pets. Day of Surgery : Do not apply any lotions/deodorants the morning of surgery.  Please wear clean clothes to the hospital/surgery center.  FAILURE TO FOLLOW THESE INSTRUCTIONS MAY RESULT IN THE CANCELLATION OF YOUR SURGERY  PATIENT SIGNATURE_________________________________  NURSE SIGNATURE__________________________________  ________________________________________________________________________

## 2017-10-26 NOTE — Pre-Procedure Instructions (Signed)
Last office note Dr. Henrene Pastor in epic.

## 2017-10-27 ENCOUNTER — Encounter (HOSPITAL_COMMUNITY): Payer: Self-pay

## 2017-10-27 ENCOUNTER — Other Ambulatory Visit: Payer: Self-pay

## 2017-10-27 ENCOUNTER — Encounter (HOSPITAL_COMMUNITY)
Admission: RE | Admit: 2017-10-27 | Discharge: 2017-10-27 | Disposition: A | Discharge status: Home / Self Care | Payer: Managed Care, Other (non HMO) | Source: Ambulatory Visit | Attending: General Surgery | Admitting: General Surgery

## 2017-10-27 DIAGNOSIS — Z01812 Encounter for preprocedural laboratory examination: Secondary | ICD-10-CM | POA: Insufficient documentation

## 2017-10-27 DIAGNOSIS — Z01810 Encounter for preprocedural cardiovascular examination: Secondary | ICD-10-CM | POA: Diagnosis present

## 2017-10-27 HISTORY — DX: Unspecified ovarian cyst, right side: N83.201

## 2017-10-27 HISTORY — DX: Gastro-esophageal reflux disease without esophagitis: K21.9

## 2017-10-27 HISTORY — DX: Obesity, unspecified: E66.9

## 2017-10-27 LAB — CBC
HCT: 50.5 % — ABNORMAL HIGH (ref 36.0–46.0)
Hemoglobin: 16.5 g/dL — ABNORMAL HIGH (ref 12.0–15.0)
MCH: 28.9 pg (ref 26.0–34.0)
MCHC: 32.7 g/dL (ref 30.0–36.0)
MCV: 88.4 fL (ref 78.0–100.0)
PLATELETS: 203 10*3/uL (ref 150–400)
RBC: 5.71 MIL/uL — ABNORMAL HIGH (ref 3.87–5.11)
RDW: 15.3 % (ref 11.5–15.5)
WBC: 15 10*3/uL — AB (ref 4.0–10.5)

## 2017-10-27 LAB — BASIC METABOLIC PANEL
Anion gap: 8 (ref 5–15)
BUN: 8 mg/dL (ref 6–20)
CALCIUM: 9.5 mg/dL (ref 8.9–10.3)
CO2: 28 mmol/L (ref 22–32)
Chloride: 105 mmol/L (ref 101–111)
Creatinine, Ser: 0.72 mg/dL (ref 0.44–1.00)
GFR calc Af Amer: >60 mL/min (ref >60)
Glucose, Bld: 62 mg/dL — ABNORMAL LOW (ref 65–99)
Potassium: 4.3 mmol/L (ref 3.5–5.1)
SODIUM: 141 mmol/L (ref 135–145)

## 2017-10-27 LAB — HCG, SERUM, QUALITATIVE: PREG SERUM: NEGATIVE

## 2017-10-27 NOTE — Progress Notes (Signed)
Cbc done 10-27-17  routed to Dr. Excell Seltzer via epic

## 2017-10-28 NOTE — Progress Notes (Signed)
Final ekg done 10-27-17 in epic 

## 2017-11-01 ENCOUNTER — Encounter (HOSPITAL_COMMUNITY): Payer: Self-pay | Admitting: Anesthesiology

## 2017-11-01 NOTE — Anesthesia Preprocedure Evaluation (Addendum)
Anesthesia Evaluation  Patient identified by MRN, date of birth, ID band Patient awake    Reviewed: Allergy & Precautions, NPO status , Patient's Chart, lab work & pertinent test results  Airway Mallampati: II  TM Distance: >3 FB Neck ROM: Full    Dental no notable dental hx. (+) Teeth Intact   Pulmonary Current Smoker,    Pulmonary exam normal breath sounds clear to auscultation       Cardiovascular hypertension, Normal cardiovascular exam Rhythm:Regular Rate:Normal     Neuro/Psych  Headaches, PSYCHIATRIC DISORDERS Anxiety Depression    GI/Hepatic Neg liver ROS, GERD  Medicated and Controlled,Meckel's diverticulum   Endo/Other  Hypothyroidism Obesity  Renal/GU negative Renal ROS  negative genitourinary   Musculoskeletal negative musculoskeletal ROS (+)   Abdominal (+) + obese,   Peds  Hematology   Anesthesia Other Findings   Reproductive/Obstetrics HPV                            Anesthesia Physical Anesthesia Plan  ASA: II  Anesthesia Plan: General   Post-op Pain Management:    Induction: Intravenous  PONV Risk Score and Plan: 4 or greater and Scopolamine patch - Pre-op, Midazolam, Dexamethasone and Ondansetron  Airway Management Planned: Oral ETT  Additional Equipment:   Intra-op Plan:   Post-operative Plan: Extubation in OR  Informed Consent: I have reviewed the patients History and Physical, chart, labs and discussed the procedure including the risks, benefits and alternatives for the proposed anesthesia with the patient or authorized representative who has indicated his/her understanding and acceptance.   Dental advisory given  Plan Discussed with: Anesthesiologist, Surgeon and CRNA  Anesthesia Plan Comments:        Anesthesia Quick Evaluation

## 2017-11-02 ENCOUNTER — Ambulatory Visit (HOSPITAL_COMMUNITY): Payer: Managed Care, Other (non HMO) | Admitting: Anesthesiology

## 2017-11-02 ENCOUNTER — Other Ambulatory Visit: Payer: Self-pay

## 2017-11-02 ENCOUNTER — Encounter (HOSPITAL_COMMUNITY): Payer: Self-pay | Admitting: Anesthesiology

## 2017-11-02 ENCOUNTER — Ambulatory Visit (HOSPITAL_COMMUNITY)
Admission: RE | Admit: 2017-11-02 | Discharge: 2017-11-02 | Disposition: A | Discharge status: Home / Self Care | Payer: Managed Care, Other (non HMO) | Source: Ambulatory Visit | Attending: General Surgery | Admitting: General Surgery

## 2017-11-02 ENCOUNTER — Encounter (HOSPITAL_COMMUNITY)
Admission: RE | Disposition: A | Discharge status: Home / Self Care | Payer: Self-pay | Source: Ambulatory Visit | Attending: General Surgery

## 2017-11-02 DIAGNOSIS — K219 Gastro-esophageal reflux disease without esophagitis: Secondary | ICD-10-CM | POA: Insufficient documentation

## 2017-11-02 DIAGNOSIS — I10 Essential (primary) hypertension: Secondary | ICD-10-CM | POA: Insufficient documentation

## 2017-11-02 DIAGNOSIS — Q430 Meckel's diverticulum (displaced) (hypertrophic): Secondary | ICD-10-CM | POA: Diagnosis not present

## 2017-11-02 DIAGNOSIS — E669 Obesity, unspecified: Secondary | ICD-10-CM | POA: Diagnosis not present

## 2017-11-02 DIAGNOSIS — Z6833 Body mass index (BMI) 33.0-33.9, adult: Secondary | ICD-10-CM | POA: Diagnosis not present

## 2017-11-02 DIAGNOSIS — F329 Major depressive disorder, single episode, unspecified: Secondary | ICD-10-CM | POA: Diagnosis not present

## 2017-11-02 DIAGNOSIS — F17200 Nicotine dependence, unspecified, uncomplicated: Secondary | ICD-10-CM | POA: Diagnosis not present

## 2017-11-02 DIAGNOSIS — R109 Unspecified abdominal pain: Secondary | ICD-10-CM | POA: Diagnosis present

## 2017-11-02 HISTORY — PX: LAPAROSCOPIC SMALL BOWEL RESECTION: SHX5929

## 2017-11-02 LAB — PREGNANCY, URINE: Preg Test, Ur: NEGATIVE

## 2017-11-02 SURGERY — EXCISION, SMALL INTESTINE, LAPAROSCOPIC
Anesthesia: General

## 2017-11-02 MED ORDER — ROCURONIUM BROMIDE 100 MG/10ML IV SOLN
INTRAVENOUS | Status: DC | PRN
  Administered 2017-11-02: 60 mg via INTRAVENOUS

## 2017-11-02 MED ORDER — OXYCODONE HCL 5 MG PO TABS: 5.0000 mg | ORAL_TABLET | Freq: Once | ORAL | Status: DC | PRN

## 2017-11-02 MED ORDER — LACTATED RINGERS IR SOLN
Status: DC | PRN
  Administered 2017-11-02: 1000 mL

## 2017-11-02 MED ORDER — ONDANSETRON HCL 4 MG/2ML IJ SOLN
INTRAMUSCULAR | Status: DC | PRN
  Administered 2017-11-02: 4 mg via INTRAVENOUS

## 2017-11-02 MED ORDER — 0.9 % SODIUM CHLORIDE (POUR BTL) OPTIME
TOPICAL | Status: DC | PRN
  Administered 2017-11-02: 1000 mL

## 2017-11-02 MED ORDER — HYDROCODONE-ACETAMINOPHEN 5-325 MG PO TABS: 1.0000 | ORAL_TABLET | ORAL | 0 refills | Status: DC | PRN

## 2017-11-02 MED ORDER — ONDANSETRON HCL 4 MG/2ML IJ SOLN
INTRAMUSCULAR | Status: AC
  Filled 2017-11-02: qty 2

## 2017-11-02 MED ORDER — HYDROMORPHONE HCL 1 MG/ML IJ SOLN: 0.2500 mg | INTRAMUSCULAR | Status: DC | PRN

## 2017-11-02 MED ORDER — DEXAMETHASONE SODIUM PHOSPHATE 10 MG/ML IJ SOLN
INTRAMUSCULAR | Status: AC
  Filled 2017-11-02: qty 1

## 2017-11-02 MED ORDER — KETAMINE HCL 10 MG/ML IJ SOLN
INTRAMUSCULAR | Status: DC | PRN
  Administered 2017-11-02: 30 mg via INTRAVENOUS

## 2017-11-02 MED ORDER — LIDOCAINE HCL (CARDIAC) 20 MG/ML IV SOLN
INTRAVENOUS | Status: DC | PRN
  Administered 2017-11-02: 80 mg via INTRAVENOUS

## 2017-11-02 MED ORDER — ACETAMINOPHEN 500 MG PO TABS
1000.0000 mg | ORAL_TABLET | ORAL | Status: AC
  Administered 2017-11-02: 1000 mg via ORAL
  Filled 2017-11-02: qty 2

## 2017-11-02 MED ORDER — SUGAMMADEX SODIUM 200 MG/2ML IV SOLN
INTRAVENOUS | Status: AC
  Filled 2017-11-02: qty 2

## 2017-11-02 MED ORDER — SCOPOLAMINE 1 MG/3DAYS TD PT72
MEDICATED_PATCH | TRANSDERMAL | Status: AC
  Filled 2017-11-02: qty 1

## 2017-11-02 MED ORDER — MEPERIDINE HCL 50 MG/ML IJ SOLN: 6.2500 mg | INTRAMUSCULAR | Status: DC | PRN

## 2017-11-02 MED ORDER — CHLORHEXIDINE GLUCONATE CLOTH 2 % EX PADS: 6.0000 | MEDICATED_PAD | Freq: Once | CUTANEOUS | Status: DC

## 2017-11-02 MED ORDER — MIDAZOLAM HCL 5 MG/5ML IJ SOLN
INTRAMUSCULAR | Status: DC | PRN
  Administered 2017-11-02: 2 mg via INTRAVENOUS

## 2017-11-02 MED ORDER — FENTANYL CITRATE (PF) 250 MCG/5ML IJ SOLN
INTRAMUSCULAR | Status: AC
  Filled 2017-11-02: qty 5

## 2017-11-02 MED ORDER — ROCURONIUM BROMIDE 10 MG/ML (PF) SYRINGE
PREFILLED_SYRINGE | INTRAVENOUS | Status: AC
Start: 2017-11-02 — End: ?
  Filled 2017-11-02: qty 5

## 2017-11-02 MED ORDER — BUPIVACAINE-EPINEPHRINE (PF) 0.5% -1:200000 IJ SOLN
INTRAMUSCULAR | Status: AC
Start: 2017-11-02 — End: ?
  Filled 2017-11-02: qty 30

## 2017-11-02 MED ORDER — LIDOCAINE HCL 2 % IJ SOLN
INTRAMUSCULAR | Status: AC
  Filled 2017-11-02: qty 20

## 2017-11-02 MED ORDER — OXYCODONE HCL 5 MG/5ML PO SOLN
5.0000 mg | Freq: Once | ORAL | Status: DC | PRN
  Filled 2017-11-02: qty 5

## 2017-11-02 MED ORDER — METOCLOPRAMIDE HCL 5 MG/ML IJ SOLN: 10.0000 mg | Freq: Once | INTRAMUSCULAR | Status: DC | PRN

## 2017-11-02 MED ORDER — SUGAMMADEX SODIUM 500 MG/5ML IV SOLN
INTRAVENOUS | Status: DC | PRN
  Administered 2017-11-02: 500 mg via INTRAVENOUS

## 2017-11-02 MED ORDER — FENTANYL CITRATE (PF) 100 MCG/2ML IJ SOLN
INTRAMUSCULAR | Status: DC | PRN
  Administered 2017-11-02 (×2): 100 ug via INTRAVENOUS
  Administered 2017-11-02: 50 ug via INTRAVENOUS

## 2017-11-02 MED ORDER — LACTATED RINGERS IV SOLN
INTRAVENOUS | Status: DC
  Administered 2017-11-02 (×2): via INTRAVENOUS

## 2017-11-02 MED ORDER — CELECOXIB 200 MG PO CAPS
200.0000 mg | ORAL_CAPSULE | ORAL | Status: AC
  Administered 2017-11-02: 200 mg via ORAL
  Filled 2017-11-02: qty 1

## 2017-11-02 MED ORDER — SUGAMMADEX SODIUM 500 MG/5ML IV SOLN
INTRAVENOUS | Status: AC
Start: 2017-11-02 — End: ?
  Filled 2017-11-02: qty 5

## 2017-11-02 MED ORDER — PROPOFOL 10 MG/ML IV BOLUS
INTRAVENOUS | Status: AC
  Filled 2017-11-02: qty 20

## 2017-11-02 MED ORDER — PROPOFOL 10 MG/ML IV BOLUS
INTRAVENOUS | Status: DC | PRN
  Administered 2017-11-02: 120 mg via INTRAVENOUS

## 2017-11-02 MED ORDER — GABAPENTIN 300 MG PO CAPS
300.0000 mg | ORAL_CAPSULE | ORAL | Status: AC
  Administered 2017-11-02: 300 mg via ORAL
  Filled 2017-11-02: qty 1

## 2017-11-02 MED ORDER — DEXAMETHASONE SODIUM PHOSPHATE 10 MG/ML IJ SOLN
INTRAMUSCULAR | Status: DC | PRN
  Administered 2017-11-02: 10 mg via INTRAVENOUS

## 2017-11-02 MED ORDER — CIPROFLOXACIN IN D5W 400 MG/200ML IV SOLN
400.0000 mg | INTRAVENOUS | Status: AC
  Administered 2017-11-02: 400 mg via INTRAVENOUS
  Filled 2017-11-02: qty 200

## 2017-11-02 MED ORDER — LIDOCAINE 2% (20 MG/ML) 5 ML SYRINGE
INTRAMUSCULAR | Status: AC
  Filled 2017-11-02: qty 5

## 2017-11-02 MED ORDER — BUPIVACAINE-EPINEPHRINE (PF) 0.5% -1:200000 IJ SOLN
INTRAMUSCULAR | Status: DC | PRN
  Administered 2017-11-02: 15 mL

## 2017-11-02 MED ORDER — KETAMINE HCL 10 MG/ML IJ SOLN
INTRAMUSCULAR | Status: AC
  Filled 2017-11-02: qty 1

## 2017-11-02 MED ORDER — LIDOCAINE 20MG/ML (2%) 15 ML SYRINGE OPTIME
INTRAMUSCULAR | Status: DC | PRN
  Administered 2017-11-02: 1.5 mg/kg/h via INTRAVENOUS

## 2017-11-02 MED ORDER — MIDAZOLAM HCL 2 MG/2ML IJ SOLN
INTRAMUSCULAR | Status: AC
  Filled 2017-11-02: qty 2

## 2017-11-02 SURGICAL SUPPLY — 72 items
ADH SKN CLS APL DERMABOND .7 (GAUZE/BANDAGES/DRESSINGS) ×1
APPLIER CLIP ROT 10 11.4 M/L (STAPLE)
APR CLP MED LRG 11.4X10 (STAPLE)
BAG SPEC RTRVL LRG 6X4 10 (ENDOMECHANICALS) ×1
BLADE EXTENDED COATED 6.5IN (ELECTRODE) IMPLANT
CABLE HIGH FREQUENCY MONO STRZ (ELECTRODE) ×3 IMPLANT
CELLS DAT CNTRL 66122 CELL SVR (MISCELLANEOUS) IMPLANT
CHLORAPREP W/TINT 26ML (MISCELLANEOUS) ×3 IMPLANT
CLIP APPLIE ROT 10 11.4 M/L (STAPLE) IMPLANT
COUNTER NEEDLE 20 DBL MAG RED (NEEDLE) IMPLANT
COVER MAYO STAND STRL (DRAPES) ×3 IMPLANT
CUTTER FLEX LINEAR 45M (STAPLE) ×3 IMPLANT
DECANTER SPIKE VIAL GLASS SM (MISCELLANEOUS) IMPLANT
DERMABOND ADVANCED (GAUZE/BANDAGES/DRESSINGS) ×2
DERMABOND ADVANCED .7 DNX12 (GAUZE/BANDAGES/DRESSINGS) ×1 IMPLANT
DRAIN CHANNEL 19F RND (DRAIN) IMPLANT
DRAPE LAPAROSCOPIC ABDOMINAL (DRAPES) ×3 IMPLANT
DRAPE SURG IRRIG POUCH 19X23 (DRAPES) IMPLANT
DRSG OPSITE POSTOP 4X10 (GAUZE/BANDAGES/DRESSINGS) IMPLANT
DRSG OPSITE POSTOP 4X6 (GAUZE/BANDAGES/DRESSINGS) IMPLANT
DRSG OPSITE POSTOP 4X8 (GAUZE/BANDAGES/DRESSINGS) IMPLANT
ELECT PENCIL ROCKER SW 15FT (MISCELLANEOUS) ×3 IMPLANT
ELECT REM PT RETURN 15FT ADLT (MISCELLANEOUS) ×3 IMPLANT
EVACUATOR SILICONE 100CC (DRAIN) IMPLANT
GAUZE SPONGE 4X4 12PLY STRL (GAUZE/BANDAGES/DRESSINGS) IMPLANT
GLOVE BIOGEL PI IND STRL 7.5 (GLOVE) ×2 IMPLANT
GLOVE BIOGEL PI INDICATOR 7.5 (GLOVE) ×4
GLOVE ECLIPSE 7.5 STRL STRAW (GLOVE) ×6 IMPLANT
GOWN STRL REUS W/TWL XL LVL3 (GOWN DISPOSABLE) ×9 IMPLANT
HOLDER FOLEY CATH W/STRAP (MISCELLANEOUS) IMPLANT
LEGGING LITHOTOMY PAIR STRL (DRAPES) IMPLANT
LUBRICANT JELLY K Y 4OZ (MISCELLANEOUS) IMPLANT
PACK COLON (CUSTOM PROCEDURE TRAY) IMPLANT
PAD POSITIONING PINK XL (MISCELLANEOUS) IMPLANT
PORT LAP GEL ALEXIS MED 5-9CM (MISCELLANEOUS) IMPLANT
POSITIONER SURGICAL ARM (MISCELLANEOUS) IMPLANT
POUCH SPECIMEN RETRIEVAL 10MM (ENDOMECHANICALS) ×3 IMPLANT
RELOAD BLUE CHELON 45 (STAPLE) ×3 IMPLANT
RTRCTR WOUND ALEXIS 18CM MED (MISCELLANEOUS)
SCISSORS LAP 5X35 DISP (ENDOMECHANICALS) ×3 IMPLANT
SEALER TISSUE X1 CVD JAW (INSTRUMENTS) IMPLANT
SET IRRIG TUBING LAPAROSCOPIC (IRRIGATION / IRRIGATOR) ×3 IMPLANT
SHEARS HARMONIC ACE PLUS 36CM (ENDOMECHANICALS) ×3 IMPLANT
SLEEVE ADV FIXATION 5X100MM (TROCAR) ×6 IMPLANT
SPONGE DRAIN TRACH 4X4 STRL 2S (GAUZE/BANDAGES/DRESSINGS) IMPLANT
STAPLER VISISTAT 35W (STAPLE) IMPLANT
SUT ETHILON 3 0 PS 1 (SUTURE) IMPLANT
SUT MNCRL AB 4-0 PS2 18 (SUTURE) ×3 IMPLANT
SUT PDS AB 0 CT1 36 (SUTURE) IMPLANT
SUT PDS AB 1 CTX 36 (SUTURE) IMPLANT
SUT PDS AB 1 TP1 96 (SUTURE) IMPLANT
SUT PROLENE 2 0 KS (SUTURE) IMPLANT
SUT PROLENE 3 0 SH 48 (SUTURE) IMPLANT
SUT SILK 2 0 (SUTURE) ×2
SUT SILK 2 0 SH CR/8 (SUTURE) ×3 IMPLANT
SUT SILK 2-0 18XBRD TIE 12 (SUTURE) ×1 IMPLANT
SUT SILK 3 0 (SUTURE) ×3
SUT SILK 3 0 SH CR/8 (SUTURE) ×3 IMPLANT
SUT SILK 3-0 18XBRD TIE 12 (SUTURE) ×1 IMPLANT
SYS LAPSCP GELPORT 120MM (MISCELLANEOUS)
SYSTEM LAPSCP GELPORT 120MM (MISCELLANEOUS) IMPLANT
TAPE CLOTH 4X10 WHT NS (GAUZE/BANDAGES/DRESSINGS) IMPLANT
TOWEL OR 17X26 10 PK STRL BLUE (TOWEL DISPOSABLE) IMPLANT
TOWEL OR NON WOVEN STRL DISP B (DISPOSABLE) ×3 IMPLANT
TRAY FOLEY W/METER SILVER 16FR (SET/KITS/TRAYS/PACK) IMPLANT
TROCAR ADV FIXATION 12X100MM (TROCAR) IMPLANT
TROCAR ADV FIXATION 5X100MM (TROCAR) ×3 IMPLANT
TROCAR BLADELESS OPT 5 100 (ENDOMECHANICALS) IMPLANT
TROCAR XCEL BLUNT TIP 100MML (ENDOMECHANICALS) ×3 IMPLANT
TUBING CONNECTING 10 (TUBING) IMPLANT
TUBING CONNECTING 10' (TUBING)
TUBING INSUF HEATED (TUBING) IMPLANT

## 2017-11-02 NOTE — H&P (Signed)
History of Present Illness Marland Kitchen T. Lekha Dancer MD; 10/20/2017 11:50 AM) The patient is a 32 year old female who presents with non-malignant abdominal pain. Patient is a 32 year old female referred by Dr. Henrene Pastor after an apparent episode of Meckel's diverticulitis. She was in her usual state of health in December when she developed the onset of abdominal pain. She had fairly rapid onset over several hours of constant right lower quadrant abdominal pain. This radiated across her abdomen with severe lower abdominal cramping. This was associated with nausea and vomiting. She was seen at an urgent care. CT scan was obtained through [facility] at that time. I will obtain these reports but secondhand per Dr. Henrene Pastor this showed a Meckel's diverticulitis. She was treated with oral antibiotics and the symptoms gradually resolved. Since then she has however had some milder intermittent episodes of right lower quadrant pain that have not required specific treatment. She does have some chronic reflux symptoms for which he takes Zantac. Previous abdominal surgery includes cholecystectomy, C-section 2 and resection of fallopian tubes. She tends to have some constipation. This was worse during her acute illness.   Past Surgical History Alean Rinne, [place]; 10/20/2017 11:01 AM) Cesarean Section - Multiple  Foot Surgery  Bilateral. Gallbladder Surgery - Laparoscopic   Diagnostic Studies History Alean Rinne, [place]; 10/20/2017 11:01 AM) Colonoscopy  never Mammogram  never Pap Smear  1-5 years ago  Allergies Alean Rinne, RMA; 10/20/2017 11:02 AM) No Known Drug Allergies [10/20/2017]: Allergies Reconciled   Medication History Alean Rinne, [place]; 10/20/2017 11:05 AM) Medications Reconciled Wellbutrin SR (150MG  Tablet ER 12HR, Oral) Active. Adderall (10MG  Tablet, Oral) Active.  Social History Alean Rinne, [place]; 10/20/2017 11:01 AM) Alcohol use  Occasional alcohol use. Caffeine use  Coffee,  Tea. No drug use  Tobacco use  Current every day smoker.  Family History Alean Rinne, [place]; 10/20/2017 11:01 AM) Anesthetic complications  Mother. Arthritis  Family Members In General. Colon Polyps  Mother. Depression  Brother, Mother. Heart Disease  Mother. Prostate Cancer  Father. Thyroid problems  Family Members In General.  Pregnancy / Birth History Alean Rinne, [place]; 10/20/2017 11:01 AM) Age at menarche  66 years. Contraceptive History  Intrauterine device, Oral contraceptives. Gravida  2 Maternal age  29-30 Para  2  Other Problems Alean Rinne, [place]; 10/20/2017 11:01 AM) Anxiety Disorder  Back Pain  Cholelithiasis  Depression  Diverticulosis  Gastroesophageal Reflux Disease  High blood pressure  Hypercholesterolemia  Thyroid Disease     Review of Systems Alean Rinne RMA; 10/20/2017 11:01 AM) General Present- Fatigue, Night Sweats and Weight Gain. Not Present- Appetite Loss, Chills, Fever and Weight Loss. Skin Not Present- Change in Wart/Mole, Dryness, Hives, Jaundice, New Lesions, Non-Healing Wounds, Rash and Ulcer. HEENT Present- Wears glasses/contact lenses. Not Present- Earache, Hearing Loss, Hoarseness, Nose Bleed, Oral Ulcers, Ringing in the Ears, Seasonal Allergies, Sinus Pain, Sore Throat, Visual Disturbances and Yellow Eyes. Respiratory Present- Snoring. Not Present- Bloody sputum, Chronic Cough, Difficulty Breathing and Wheezing. Breast Not Present- Breast Mass, Breast Pain, Nipple Discharge and Skin Changes. Cardiovascular Present- Swelling of Extremities. Not Present- Chest Pain, Difficulty Breathing Lying Down, Leg Cramps, Palpitations, Rapid Heart Rate and Shortness of Breath. Gastrointestinal Present- Abdominal Pain, Bloating, Change in Bowel Habits, Constipation, Indigestion, Nausea and Vomiting. Not Present- Bloody Stool, Chronic diarrhea, Difficulty Swallowing, Excessive gas, Gets full quickly at meals, Hemorrhoids and Rectal  Pain. Female Genitourinary Present- Nocturia, Painful Urination, Pelvic Pain and Urgency. Not Present- Frequency. Musculoskeletal Present- Joint Pain, Joint Stiffness and Muscle Pain. Not  Present- Back Pain, Muscle Weakness and Swelling of Extremities. Neurological Present- Headaches. Not Present- Decreased Memory, Fainting, Numbness, Seizures, Tingling, Tremor, Trouble walking and Weakness. Psychiatric Present- Anxiety and Depression. Not Present- Bipolar, Change in Sleep Pattern, Fearful and Frequent crying. Endocrine Present- Hair Changes and Hot flashes. Not Present- Cold Intolerance, Excessive Hunger, Heat Intolerance and New Diabetes. Hematology Not Present- Blood Thinners, Easy Bruising, Excessive bleeding, Gland problems, HIV and Persistent Infections.  Vitals Mardene Celeste King RMA; 10/20/2017 11:02 AM) 10/20/2017 11:02 AM Weight: 218.2 lb Height: 67in Body Surface Area: 2.1 m Body Mass Index: 34.17 kg/m  Temp.: 98.71F  Pulse: 97 (Regular)  BP: 135/82 (Sitting, Left Arm, Standard)       Physical Exam Marland Kitchen T. Courtany Mcmurphy MD; 10/20/2017 11:54 AM) The physical exam findings are as follows: Note:General: Alert, somewhat overweight Caucasian female, in no distress Skin: Warm and dry without rash or infection. HEENT: No palpable masses or thyromegaly. Sclera nonicteric. Pupils equal round and reactive. Lymph nodes: No cervical, supraclavicular, nodes palpable. Lungs: Breath sounds clear and equal. No wheezing or increased work of breathing. Cardiovascular: Regular rate and rhythm without murmer. No JVD or edema. Peripheral pulses intact. Abdomen: Nondistended. There is moderate localized right lower quadrant tenderness with some guarding to deep palpation. No masses palpable. No organomegaly. No palpable hernias. Extremities: No edema or joint swelling or deformity. No chronic venous stasis changes. Neurologic: Alert and fully oriented. Gait normal. No focal  weakness. Psychiatric: Normal mood and affect. Thought content appropriate with normal judgement and insight    Assessment & Plan Marland Kitchen T. 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 This acute episode has resolved but she continues to have some intermittent discomfort. Follow-up CT scan confirmed Meckel's diverticulum without evidence of diverticulitis. She does however have tenderness on exam today. She has some ongoing symptoms and I believe is at significant risk for further acute episodes and therefore I recommended laparoscopic resection of her Meckel's diverticulum. She is in agreement with this plan. We discussed the nature of laparoscopic removal. Discussed risks of anesthetic complications, bleeding, infection or possible need for open surgery or even small bowel resection. Discussed slight long-term risk of scarring and bowel blockage. She understands and desires to proceed. Current Plans Laparoscopic resection of Meckel's diverticulum under general anesthesia as an outpatient

## 2017-11-02 NOTE — Transfer of Care (Signed)
Immediate Anesthesia Transfer of Care Note  Patient: Michele Rojas  Procedure(s) Performed: LAPAROSCOPIC REMOVAL OF MECKEL'S DIVERTICULUM ERAS PATHWAY (N/A )  Patient Location: PACU  Anesthesia Type:General  Level of Consciousness: drowsy, patient cooperative and responds to stimulation  Airway & Oxygen Therapy: Patient Spontanous Breathing and Patient connected to face mask oxygen  Post-op Assessment: Report given to RN and Post -op Vital signs reviewed and stable  Post vital signs: Reviewed and stable  Last Vitals:  Vitals Value Taken Time  BP 136/97 11/02/2017  9:45 AM  Temp    Pulse 80 11/02/2017  9:48 AM  Resp 13 11/02/2017  9:48 AM  SpO2 99 % 11/02/2017  9:48 AM  Vitals shown include unvalidated device data.  Last Pain:  Vitals:   11/02/17 0705  TempSrc:   PainSc: 0-No pain      Patients Stated Pain Goal: 4 (15/87/27 6184)  Complications: No apparent anesthesia complications

## 2017-11-02 NOTE — Interval H&P Note (Signed)
History and Physical Interval Note:  11/02/2017 7:55 AM  Michele Rojas  has presented today for surgery, with the diagnosis of Meckel's diverticulitis  The various methods of treatment have been discussed with the patient and family. After consideration of risks, benefits and other options for treatment, the patient has consented to  Procedure(s): [place] (N/A) as a surgical intervention .  The patient's history has been reviewed, patient examined, no change in status, stable for surgery.  I have reviewed the patient's chart and labs.  Questions were answered to the patient's satisfaction.     Darene Lamer Yamilet Mcfayden

## 2017-11-02 NOTE — Anesthesia Procedure Notes (Signed)
Procedure Name: Intubation Date/Time: 11/02/2017 8:31 AM Performed by: Glory Buff, CRNA Pre-anesthesia Checklist: Patient identified, Emergency Drugs available, Suction available and Patient being monitored Patient Re-evaluated:Patient Re-evaluated prior to induction Oxygen Delivery Method: Circle system utilized Preoxygenation: Pre-oxygenation with 100% oxygen Induction Type: IV induction Ventilation: Mask ventilation without difficulty Laryngoscope Size: Mac and 3 Grade View: Grade I Tube type: Oral Number of attempts: 1 Airway Equipment and Method: Stylet Placement Confirmation: ETT inserted through vocal cords under direct vision,  positive ETCO2 and breath sounds checked- equal and bilateral Secured at: 21 cm Tube secured with: Tape Dental Injury: Teeth and Oropharynx as per pre-operative assessment  Comments: DL by Darrell Jewel, SRNA

## 2017-11-02 NOTE — Discharge Instructions (Signed)
[facility] ______[facility] °LAPAROSCOPIC SURGERY: POST OP INSTRUCTIONS °Always review your discharge instruction sheet given to you by the facility where your surgery was performed. °IF YOU HAVE DISABILITY OR FAMILY LEAVE FORMS, YOU MUST BRING THEM TO THE OFFICE FOR PROCESSING.   °DO NOT GIVE THEM TO YOUR DOCTOR. ° °1. A prescription for pain medication may be given to you upon discharge.  Take your pain medication as prescribed, if needed.  If narcotic pain medicine is not needed, then you may take acetaminophen (Tylenol) or ibuprofen (Advil) as needed. °2. Take your usually prescribed medications unless otherwise directed. °3. If you need a refill on your pain medication, please contact your pharmacy.  They will contact our office to request authorization. Prescriptions will not be filled after 5pm or on week-ends. °4. You should follow a light diet the first few days after arrival home, such as soup and crackers, etc.  Be sure to include lots of fluids daily. °5. Most patients will experience some swelling and bruising in the area of the incisions.  Ice packs will help.  Swelling and bruising can take several days to resolve.  °6. It is common to experience some constipation if taking pain medication after surgery.  Increasing fluid intake and taking a stool softener (such as Colace) will usually help or prevent this problem from occurring.  A mild laxative (Milk of Magnesia or Miralax) should be taken according to package instructions if there are no bowel movements after 48 hours. °7. Unless discharge instructions indicate otherwise, you may remove your bandages 24-48 hours after surgery, and you may shower at that time.  You may have steri-strips (small skin tapes) in place directly over the incision.  These strips should be left on the skin for 7-10 days.  If your surgeon used skin glue on the incision, you may shower in 24 hours.  The glue will flake off over the next 2-3 weeks.  Any sutures or  staples will be removed at the office during your follow-up visit. °8. ACTIVITIES:  You may resume regular (light) daily activities beginning the next day--such as daily self-care, walking, climbing stairs--gradually increasing activities as tolerated.  You may have sexual intercourse when it is comfortable.  Refrain from any heavy lifting or straining until approved by your doctor. °a. You may drive when you are no longer taking prescription pain medication, you can comfortably wear a seatbelt, and you can safely maneuver your car and apply brakes. °b. RETURN TO WORK:  __________________________________________________________ °9. You should see your doctor in the office for a follow-up appointment approximately 2-3 weeks after your surgery.  Make sure that you call for this appointment within a day or two after you arrive home to insure a convenient appointment time. °10. OTHER INSTRUCTIONS: __________________________________________________________________________________________________________________________ __________________________________________________________________________________________________________________________ °WHEN TO CALL YOUR DOCTOR: °1. Fever over 101.0 °2. Inability to urinate °3. Continued bleeding from incision. °4. Increased pain, redness, or drainage from the incision. °5. Increasing abdominal pain ° °The clinic staff is available to answer your questions during regular business hours.  Please don’t hesitate to call and ask to speak to one of the nurses for clinical concerns.  If you have a medical emergency, go to the nearest emergency room or call 911.  A surgeon from [facility] is always on call at the hospital. °[address], [address], [place], [place]  [address] ? [address] [address], , [place]   [address] °[phone] ? [phone] ? FAX [phone] °Web site:   [email or web address] ° °General Anesthesia, Adult, Care After °These instructions  provide you with information about caring for yourself after your procedure. Your health care provider may also give you more specific instructions. Your treatment has been planned according to current medical practices, but problems sometimes occur. Call your health care provider if you have any problems or questions after your procedure. °What can I expect after the procedure? °After the procedure, it is common to have: °· Vomiting. °· A sore throat. °· Mental slowness. ° °It is common to feel: °· Nauseous. °· Cold or shivery. °· Sleepy. °· Tired. °· Sore or achy, even in parts of your body where you did not have surgery. ° °Follow these instructions at home: °For at least 24 hours after the procedure: °· Do not: °? Participate in activities where you could fall or become injured. °? Drive. °? Use heavy machinery. °? Drink alcohol. °? Take sleeping pills or medicines that cause drowsiness. °? Make important decisions or sign legal documents. °? Take care of children on your own. °· Rest. °Eating and drinking °· If you vomit, drink water, juice, or soup when you can drink without vomiting. °· Drink enough fluid to keep your urine clear or pale yellow. °· Make sure you have little or no nausea before eating solid foods. °· Follow the diet recommended by your health care provider. °General instructions °· Have a responsible adult stay with you until you are awake and alert. °· Return to your normal activities as told by your health care provider. Ask your health care provider what activities are safe for you. °· Take over-the-counter and prescription medicines only as told by your health care provider. °· If you smoke, do not smoke without supervision. °· Keep all follow-up visits as told by your health care provider. This is important. °Contact a health care provider if: °· You continue to have nausea or vomiting at home, and medicines are not helpful. °· You cannot drink fluids or start eating again. °· You cannot  urinate after 8-12 hours. °· You develop a skin rash. °· You have fever. °· You have increasing redness at the site of your procedure. °Get help right away if: °· You have difficulty breathing. °· You have chest pain. °· You have unexpected bleeding. °· You feel that you are having a life-threatening or urgent problem. °This information is not intended to replace advice given to you by your health care provider. Make sure you discuss any questions you have with your health care provider. °Document Released: 11/01/2000 Document Revised: 12/29/2015 Document Reviewed: 07/10/2015 °Elsevier Interactive Patient Education © 2018 [employer] ° ° °

## 2017-11-02 NOTE — Op Note (Signed)
Preoperative Diagnosis: Meckel's diverticulitis  Postoprative Diagnosis: Meckel's diverticulitis  Procedure: Procedure(s): LAPAROSCOPIC  MECKEL'S DIVERTICULECTOMY   Surgeon: Excell Seltzer T   Assistants: None  Anesthesia:  General endotracheal anesthesia  Indications: Patient is a 32 year old female who recently presented with onset of persistent right lower quadrant abdominal pain.  CT scan showed apparent Meckel's diverticulitis with some significant inflammatory change.  She was treated with antibiotics as an outpatient.  Her pain improved but she continued to have some intermittent right lower quadrant discomfort.  Follow-up CT scan has confirmed a Meckel's diverticulum but no residual inflammation.  After discussion in the office regarding options and risks of surgery detailed elsewhere we have elected to proceed with laparoscopic Meckel's diverticulectomy.    Procedure Detail: Patient was brought to the operating room, placed in the supine position on the operating table, and general endotracheal anesthesia induced.  The abdomen was widely sterilely prepped and draped.  She received preoperative IV antibiotics.  Patient timeout was performed and correct procedure verified.  Access was obtained with an open Hassan technique with a 1/2 cm incision at the umbilicus through a mattress suture of 0 Vicryl without difficulty and pneumoperitoneum established.  Under direct vision 5 mm trochars were placed in the upper midline and left lower quadrant.  The right colon and cecum were identified and appeared normal.  I then followed the ileum proximally and encountered a typical appearing Meckel's diverticulum at the expected location.  There was some thickening at the tip but no evidence of severe inflammation.  The small mesentery to the diverticulum was divided with the harmonic scalpel until the diverticulum was completely freed down to its junction with the small bowel.  The diverticulum was  then transected with a single firing of the Endo GIA 45 mm blue load stapler which was placed at right angles to the axis of the ileum to prevent narrowing.  After firing the staple line appeared intact.  There was no bleeding.  I carefully examined the bowel and there was no narrowing of the lumen in the diverticulum appeared to have been removed flush to the wall of the bowel.  The specimen was placed in an Endo Catch bag and removed through the umbilical incision.  The abdomen was inspected for bleeding or injury and everything looked fine.  All CO2 was evacuated and the mattress suture secured at the umbilicus.  Trochars were removed.  Skin incisions were closed with some particular Monocryl and Dermabond.  Sponge needle and instrument counts were correct.    Findings: As above  Estimated Blood Loss:  Minimal         Drains: None  Blood Given: none          Specimens: Meckel's diverticulum        Complications:  * No complications entered in OR log *         Disposition: PACU - hemodynamically stable.         Condition: stable

## 2017-11-02 NOTE — Anesthesia Postprocedure Evaluation (Signed)
Anesthesia Post Note  Patient: Michele Rojas  Procedure(s) Performed: LAPAROSCOPIC REMOVAL OF MECKEL'S DIVERTICULUM ERAS PATHWAY (N/A )     Patient location during evaluation: PACU Anesthesia Type: General Level of consciousness: awake and alert and oriented Pain management: pain level controlled Vital Signs Assessment: post-procedure vital signs reviewed and stable Respiratory status: spontaneous breathing and nonlabored ventilation Cardiovascular status: blood pressure returned to baseline and stable Postop Assessment: no apparent nausea or vomiting Anesthetic complications: no    Last Vitals:  Vitals:   11/02/17 0945 11/02/17 1000  BP: (!) 136/97 138/88  Pulse: 85 78  Resp: 11 17  Temp: (!) 36.3 C   SpO2: 97% 98%    Last Pain:  Vitals:   11/02/17 1000  TempSrc:   PainSc: Asleep                 Krishan Mcbreen A.

## 2017-11-03 ENCOUNTER — Encounter (HOSPITAL_COMMUNITY): Payer: Self-pay | Admitting: General Surgery

## 2017-11-03 IMAGING — CT CT CHEST W/O CM
2 of 3 series · 15 of 36 positions shown, 18 images · non-contrast
Comparison: Chest radiograph 02/20/2016

CLINICAL DATA: Right lung density seen on recent chest x-ray.
Fatigue for several years.

EXAM:
CT CHEST WITHOUT CONTRAST
TECHNIQUE: Multidetector CT imaging of the chest was performed following the
standard protocol without IV contrast.

[Series 2: thorax · axial · 0.76mm/px · z∈[-308,-34]mm · 12 of 161 slices shown, 15 images]
[im 12/161  mediastinal]
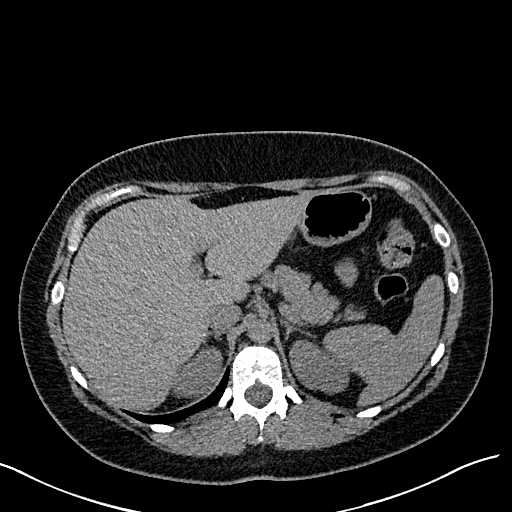
[im 12/161  lung]
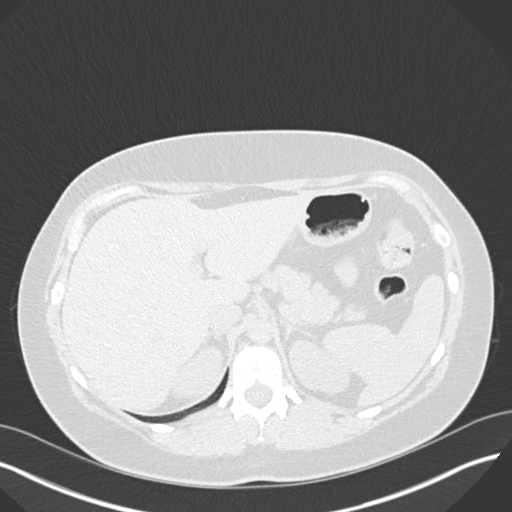
[im 24/161  lung]
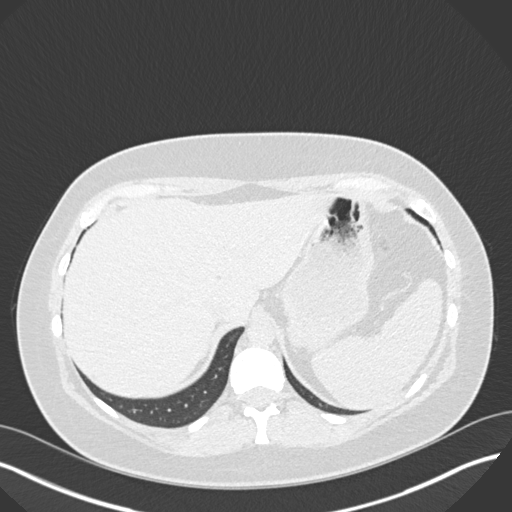
[im 36/161  lung]
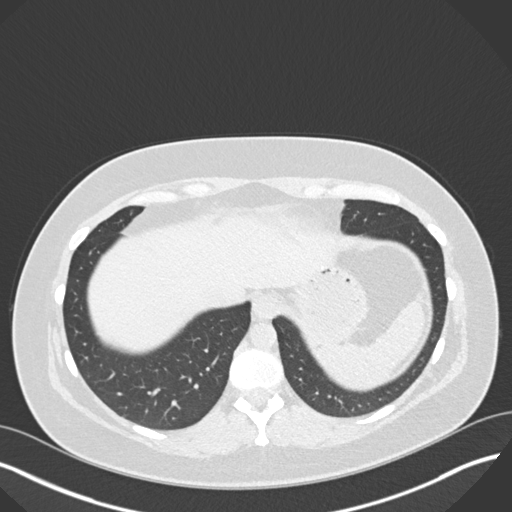
[im 48/161  lung]
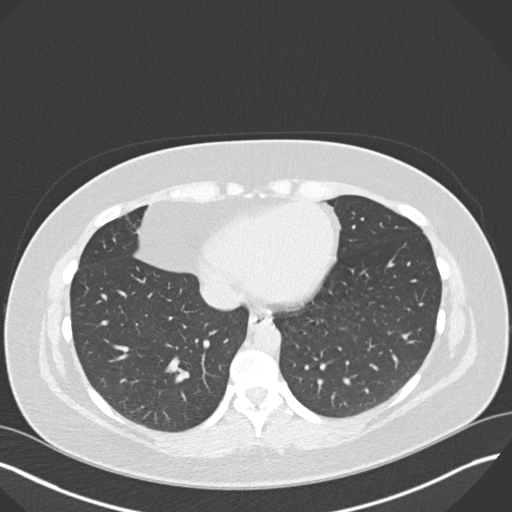
[im 60/161  mediastinal]
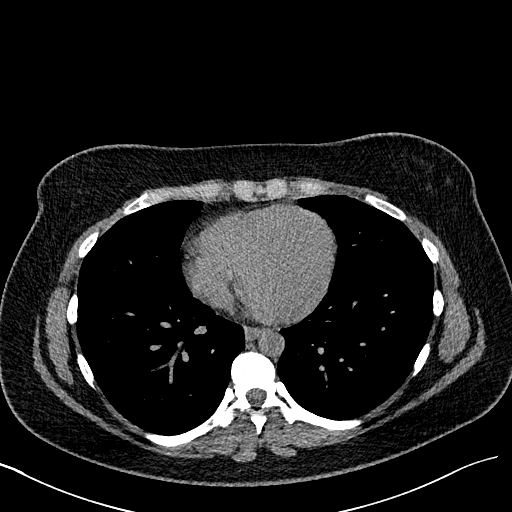
[im 60/161  lung]
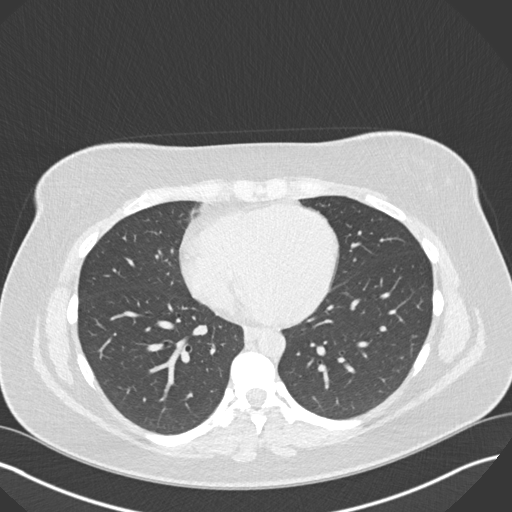
[im 72/161  lung]
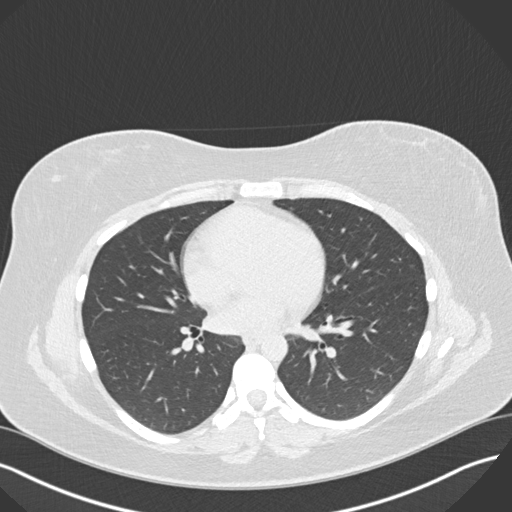
[im 89/161  lung]
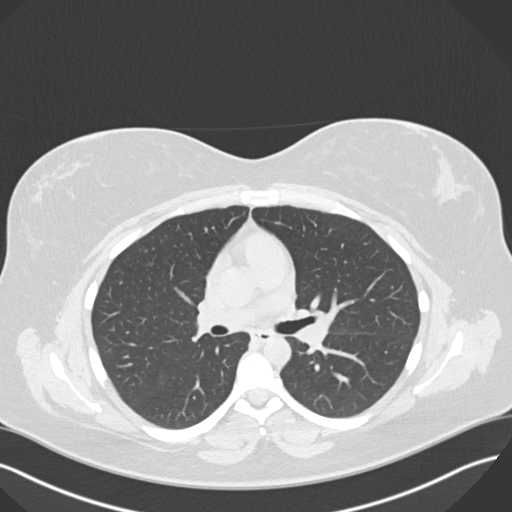
[im 101/161  lung]
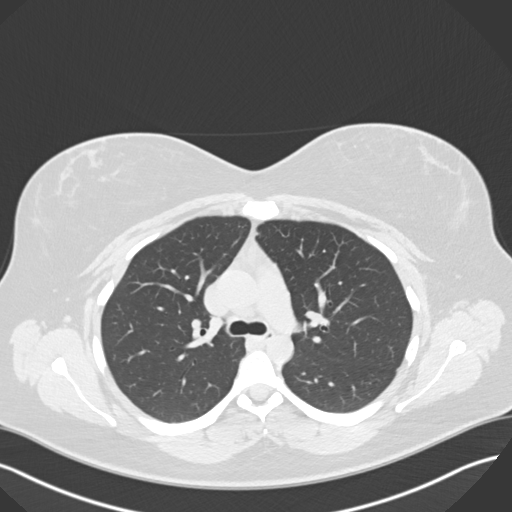
[im 113/161  mediastinal]
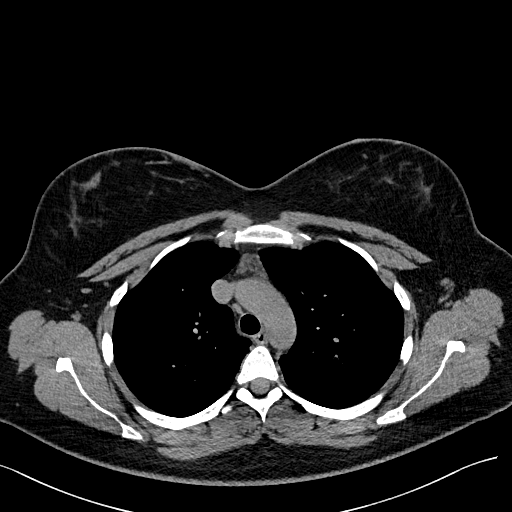
[im 113/161  lung]
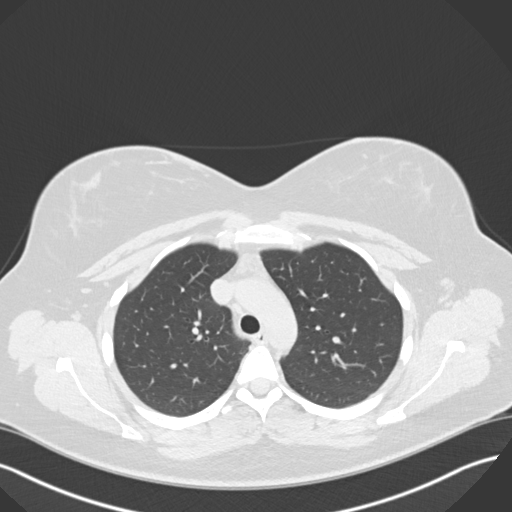
[im 125/161  lung]
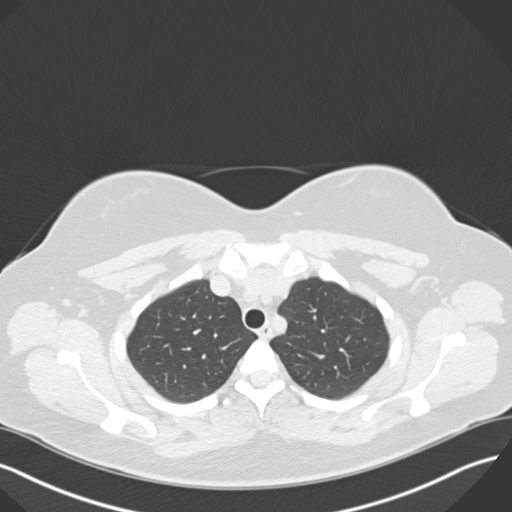
[im 137/161  lung]
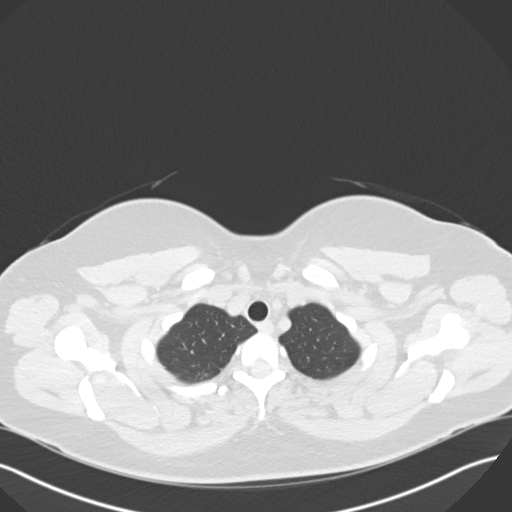
[im 149/161  lung]
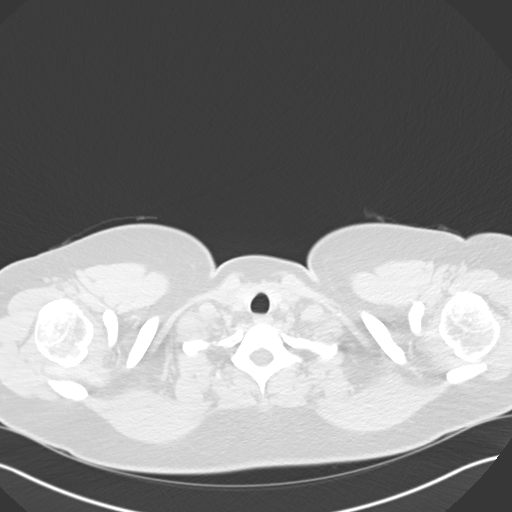

[Series 5: coronal · coronal · 0.63mm/px · 3 of 108 slices shown]
[im 22/108  lung]
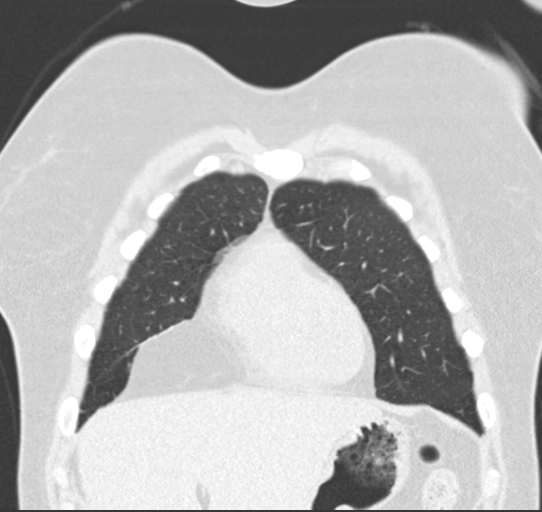
[im 43/108  lung]
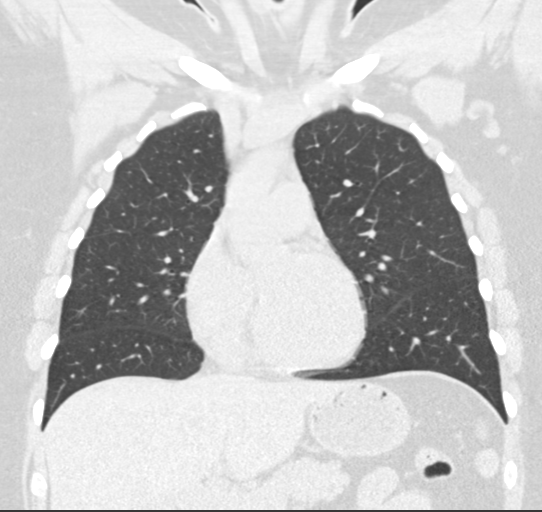
[im 65/108  lung]
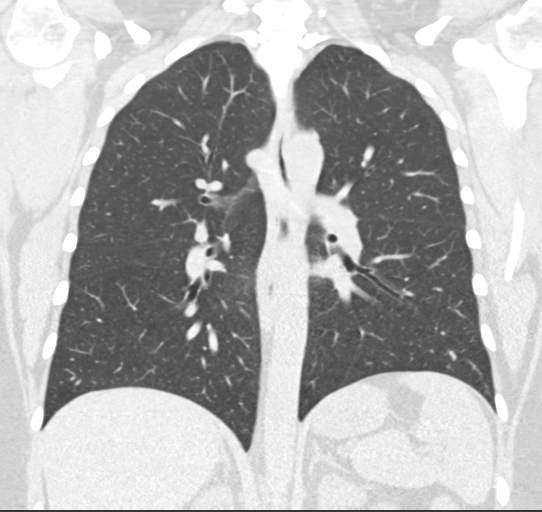

[15 of 36 positions shown; findings below may reference images not displayed]

FINDINGS: Mediastinum/Lymph Nodes: No masses or pathologically enlarged lymph
nodes identified on this un-enhanced exam. Residual thymus is
present.

Lungs/Pleura: No pulmonary mass, infiltrate, or effusion. Prominent
right pericardial fat accumulation may account for the abnormality
seen radiographically.

Upper abdomen: No acute findings.

Musculoskeletal: No chest wall mass or suspicious bone lesions
identified.
IMPRESSION: Normal CT of the chest.

Residual thymus, not unusual given patient's young age.

Prominent right pericardial fat likely accounts for the abnormality
seen radiographically.

## 2018-02-10 ENCOUNTER — Telehealth: Payer: Self-pay | Admitting: Family Medicine

## 2018-02-10 NOTE — Telephone Encounter (Signed)
Patient dropped of CT report and disk for Dr.Tower.  It's in the rx tower.

## 2018-02-10 NOTE — Telephone Encounter (Signed)
In your inbox.

## 2018-02-13 ENCOUNTER — Ambulatory Visit: Payer: Managed Care, Other (non HMO) | Admitting: Family Medicine

## 2018-02-13 ENCOUNTER — Encounter: Payer: Self-pay | Admitting: Family Medicine

## 2018-02-13 VITALS — BP 140/64 | HR 103 | Temp 98.5°F | Ht 67.0 in | Wt 197.8 lb

## 2018-02-13 DIAGNOSIS — K219 Gastro-esophageal reflux disease without esophagitis: Secondary | ICD-10-CM | POA: Diagnosis not present

## 2018-02-13 DIAGNOSIS — R1030 Lower abdominal pain, unspecified: Secondary | ICD-10-CM | POA: Insufficient documentation

## 2018-02-13 DIAGNOSIS — F17200 Nicotine dependence, unspecified, uncomplicated: Secondary | ICD-10-CM

## 2018-02-13 DIAGNOSIS — K760 Fatty (change of) liver, not elsewhere classified: Secondary | ICD-10-CM | POA: Insufficient documentation

## 2018-02-13 NOTE — Assessment & Plan Note (Signed)
Controlled by one 150 mg zantac daily  HH seen on CT- discussed this  Disc diet and handout given  Recommend decreasing sweet tea/pizza/tomato sauce Also working on weight loss

## 2018-02-13 NOTE — Progress Notes (Signed)
Subjective:    Patient ID: Michele Rojas, female    DOB: 11-27-1985, 32 y.o.   MRN: [id number]  HPI Here for f/u of CT scan done when she was seen at [facility]   She was seen at [facility] on 6/28 for low back pain of moderate intensity with low abdominal pain  Empirically tx with cipro and flagyl for abd pain  Tramadol and flexeril for back pain   UA neg  upreg neg  Wbc 15.6 Hb 16.9 Hct 49.2  CT report:  CT Abdomen Pelvis W IV Contrast7/10/2017 [facility] Result Impression  IMPRESSION: No acute abnormality identified.  Hepatomegaly and hepatic steatosis.  Electronically Signed by: Charlett Lango  Result Narrative  TECHNIQUE: Axial CT abdomen and pelvis after 80cc Isovue-370 contrast. Coronal and sagittal reconstructed images were obtained and reviewed.  COMPARISON:July 29, 2017  INDICATION: Right lower quadrant pain  FINDINGS: LUNG BASES:Clear. Heart size is normal. Small hiatal hernia.  ABDOMEN/PELVIS:Hepatomegaly measuring 23 cm in length and hepatic steatosis. Gallbladder surgically absent. The spleen, pancreas, adrenal glands, kidneys, urinary bladder, uterus and adnexa, abdominal aorta have a normal appearance.   There is no evidence of bowel inflammation or obstruction. The appendix is seen in the right lower quadrant and is normal-appearing. Previously seen Meckel's diverticulum has been excised.  There is no adenopathy, free fluid, or pneumoperitoneum.  MSK:No osseous lesions. Symmetric sclerosis present at the SI joints.  Other Result Information  Pt was concerned about hiatal hernia  Has had heartburn after her last baby  Sweet tea and pizza set her off  She takes one zantac daily - most of the time that works well    Labs in this office  Lab Results  Component Value Date   WBC 15.0 (H) 10/27/2017   HGB 16.5 (H) 10/27/2017   HCT 50.5 (H) 10/27/2017   MCV 88.4 10/27/2017   PLT 203 10/27/2017  elevated wbc is baseline for her  Lab Results    Component Value Date   ALT 34 06/16/2016   AST 27 06/16/2016   ALKPHOS 104 06/16/2016   BILITOT 0.3 06/16/2016    Wt Readings from Last 3 Encounters:  02/13/18 197 lb 12 oz (89.7 kg)  11/02/17 215 lb (97.5 kg)  10/27/17 215 lb (97.5 kg)  making effort to loose weight  She was taking trintellix - and she thinks it made it easier to loose wt  Now on wellbutrin - that may help emotional eating  Started going to the gym as well / uses the bike primarily / circuit training  Eating is still not as healthy as it should be  Has cut way back on past  30.97 kg/m   Smoking status -the same/not ready to quit   Not having pain like she was  The low abdominal pain is improved   Low back pain is improved - unsure if it was radiating from abdomen or not   Mood -stable  Also seeing a counselor   Patient Active Problem List   Diagnosis Date Noted  . GERD (gastroesophageal reflux disease) 02/13/2018  . Fatty liver 02/13/2018  . Lower abdominal pain 02/13/2018  . Weight gain, abnormal 07/28/2017  . Routine general medical examination at a health care facility 05/25/2016  . Lipoma of lower extremity 03/08/2016  . Hyperlipidemia 02/22/2016  . Leukocytosis 02/20/2016  . 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 Essential hypertension 01/07/2016  . Placental abruption 09/23/2015  . Encounter for biometric screening 09/30/2013  . Screening for  lipoid disorders 09/28/2013  . Diabetes mellitus screening 09/28/2013  . Hypothyroid 11/04/2011  . Depression 10/04/2011  . HEADACHE 11/18/2009  . Adjustment disorder with mixed anxiety and depressed mood 11/17/2009  . FATIGUE 10/06/2009  . TOBACCO USE 02/19/2009  . CHICKENPOX, HX OF 02/19/2009  . DEFICIENCY OF OTHER VITAMINS 04/29/2008  . OTHER CONSTIPATION 04/29/2008   Past Medical History:  Diagnosis Date  . ADD (attention deficit disorder)    no meds  . Anxiety   . Constipation   . Depression   . Fatigue   . Fatty liver   . Gallstones    . GERD (gastroesophageal reflux disease)   . History of chicken pox as a child  . History of preterm delivery    2015- 31wks, 2017- 35wks  . HLD (hyperlipidemia)   . HPV (human papilloma virus) infection   . HTN (hypertension)    No meds  . Hx of abnormal cervical Pap smear    ASCUS, LGSIL, CIN I, [place]  . Hypothyroidism    no meds  . Meckel's diverticulitis   . Mood swings   . Obesity   . Preeclampsia    2015 & 2017 pregnancies  . Right ovarian cyst    2.5cm  . Smoker   . Tobacco abuse   . Vitamin deficiency    Past Surgical History:  Procedure Laterality Date  . ACHILLES TENDON SURGERY Bilateral   . BONE MARROW BIOPSY  06/2017  . CESAREAN SECTION N/A 06/09/2014   Procedure: CESAREAN SECTION;  Surgeon: Farrel Gobble. Harrington Challenger, MD;  Location: [facility];  Service: Obstetrics;  Laterality: N/A;  . CESAREAN SECTION N/A 09/22/2015   Procedure: CESAREAN SECTION;  Surgeon: Jerelyn Charles, MD;  Location: [facility];  Service: Obstetrics;  Laterality: N/A;  . CHOLECYSTECTOMY    . CHOLECYSTECTOMY, LAPAROSCOPIC    . DIAGNOSTIC LAPAROSCOPY     removal of meckels diverticulum  . LAPAROSCOPIC SMALL BOWEL RESECTION N/A 11/02/2017   Procedure: LAPAROSCOPIC REMOVAL OF MECKEL'S DIVERTICULUM ERAS PATHWAY;  Surgeon: Excell Seltzer, MD;  Location: [facility];  Service: General;  Laterality: N/A;  . TUBAL LIGATION    . WISDOM TOOTH EXTRACTION     Social History   Tobacco Use  . Smoking status: Current Every Day Smoker    Packs/day: 1.00    Types: Cigarettes  . Smokeless tobacco: Never Used  Substance Use Topics  . Alcohol use: No    Alcohol/week: 0.0 oz    Comment: rare  . Drug use: No   Family History  Problem Relation Age of Onset  . Hypertension Father   . Hyperlipidemia Father   . Prostate cancer Father   . Aneurysm Maternal Grandfather   . Depression Mother        after a car accident  . Colon polyps Mother   . Other Mother        ? CAD /heart disease  . Depression Brother        major  depression  . Graves' disease Paternal Uncle   . Alzheimer's disease Paternal Grandmother   . Alzheimer's disease Paternal Grandfather   . Celiac disease Cousin        maternal cousin   Allergies  Allergen Reactions  . Amoxicillin Other (See Comments)    REACTION: yeast infection in mouth Has patient had a PCN reaction causing immediate rash, facial/tongue/throat swelling, SOB or lightheadedness with hypotension: No Has patient had a PCN reaction causing severe rash involving mucus membranes or skin necrosis: No Has patient  had a PCN reaction that required hospitalization no Has patient had a PCN reaction occurring within the last 10 years: Yes If all of the above answers are "NO", then may proceed with Cephalosporin use.   Current Outpatient Medications on File Prior to Visit  Medication Sig Dispense Refill  . amphetamine-dextroamphetamine (ADDERALL) 10 MG tablet Take 10 mg by mouth See admin instructions. May take up to 6 times a day as needed    . buPROPion (WELLBUTRIN XL) 150 MG 24 hr tablet Take 1 tablet by mouth daily.    . cholecalciferol (VITAMIN D) 1000 units tablet Take 1,000 Units by mouth daily.    . ranitidine (ZANTAC) 150 MG capsule Take 150 mg by mouth daily.     No current facility-administered medications on file prior to visit.      Review of Systems  Constitutional: Negative for activity change, appetite change, fatigue, fever and unexpected weight change.  HENT: Negative for congestion, ear pain, rhinorrhea, sinus pressure and sore throat.   Eyes: Negative for pain, redness and visual disturbance.  Respiratory: Negative for cough, shortness of breath and wheezing.   Cardiovascular: Negative for chest pain and palpitations.  Gastrointestinal: Negative for abdominal pain, anal bleeding, blood in stool, constipation and diarrhea.       Pos for occ heartburn   Endocrine: Negative for polydipsia and polyuria.  Genitourinary: Negative for dysuria, frequency and  urgency.  Musculoskeletal: Negative for arthralgias, back pain and myalgias.       Back pain is improved   Skin: Negative for pallor and rash.  Allergic/Immunologic: Negative for environmental allergies.  Neurological: Negative for dizziness, syncope and headaches.  Hematological: Negative for adenopathy. Does not bruise/bleed easily.  Psychiatric/Behavioral: Negative for decreased concentration and dysphoric mood. The patient is not nervous/anxious.        Objective:   Physical Exam  Constitutional: She appears well-developed and well-nourished. No distress.  obese and well appearing   HENT:  Head: Normocephalic and atraumatic.  Mouth/Throat: Oropharynx is clear and moist.  Eyes: Pupils are equal, round, and reactive to light. Conjunctivae and EOM are normal. No scleral icterus.  Neck: Normal range of motion. Neck supple.  Cardiovascular: Normal rate, regular rhythm and normal heart sounds.  Pulmonary/Chest: Effort normal and breath sounds normal. No respiratory distress. She has no wheezes. She has no rales.  bs are mildly distant   Abdominal: Soft. Bowel sounds are normal. She exhibits no distension and no mass. There is no tenderness. There is no rebound and no guarding. No hernia.  Lymphadenopathy:    She has no cervical adenopathy.  Neurological: She is alert. She displays normal reflexes. No cranial nerve deficit. Coordination normal.  Skin: Skin is warm and dry. No erythema. No pallor.  Psychiatric: She has a normal mood and affect.  Pleasant          a/p  Problem List Items Addressed This Visit      Digestive   Fatty liver    Seen on CT with liver enlargement  Pt is working on weight loss  Last LFTs nl  Continue to follow        GERD (gastroesophageal reflux disease) - Primary    Controlled by one 150 mg zantac daily  HH seen on CT- discussed this  Disc diet and handout given  Recommend decreasing sweet tea/pizza/tomato sauce Also working on weight loss         Relevant Medications   ranitidine (ZANTAC) 150 MG capsule  Other   Lower abdominal pain    Rev UC notes from [facility] along with reasuing CT Pt improved with cipro and flagyl for susp diverticulitis Reassuring exam  Continue to follow Update if not continuing  to improve in a week or if worsening        TOBACCO USE    Discussed how this problem influences overall health and the risks it imposes  Reviewed plan for weight loss with lower calorie diet (via better food choices and also portion control or program like weight watchers) and exercise building up to or more than 30 minutes 5 days per week including some aerobic activity

## 2018-02-13 NOTE — Telephone Encounter (Signed)
She is coming for a visit today- I have the copy

## 2018-02-13 NOTE — Patient Instructions (Addendum)
For weight loss  Try to get most of your carbohydrates from produce (with the exception of white potatoes)  Eat less bread/pasta/rice/snack foods/cereals/sweets and other items from the middle of the grocery store (processed carbs)   Don't overdo it with fatty foods either   If your low abdominal pain returns -please let us know   Take a look at the info on hiatal hernia and reflux diet   Keep working on weight loss   The CT is reassuring  We will watch liver test in the future for fatty liver / this improves with weight loss  Keep thinking about quitting smoking

## 2018-02-13 NOTE — Assessment & Plan Note (Signed)
Seen on CT with liver enlargement  Pt is working on weight loss  Last LFTs nl  Continue to follow

## 2018-02-13 NOTE — Assessment & Plan Note (Signed)
Rev UC notes from [place] along with reasuing CT Pt improved with cipro and flagyl for susp diverticulitis Reassuring exam  Continue to follow Update if not continuing  to improve in a week or if worsening

## 2018-02-13 NOTE — Assessment & Plan Note (Signed)
Discussed how this problem influences overall health and the risks it imposes  Reviewed plan for weight loss with lower calorie diet (via better food choices and also portion control or program like weight watchers) and exercise building up to or more than 30 minutes 5 days per week including some aerobic activity    

## 2018-02-17 ENCOUNTER — Ambulatory Visit: Payer: Managed Care, Other (non HMO) | Admitting: Family Medicine

## 2018-04-11 ENCOUNTER — Ambulatory Visit: Payer: Managed Care, Other (non HMO) | Admitting: Family Medicine

## 2018-04-11 ENCOUNTER — Encounter: Payer: Self-pay | Admitting: Family Medicine

## 2018-04-11 VITALS — BP 100/74 | HR 93 | Temp 98.5°F | Ht 67.0 in | Wt 195.0 lb

## 2018-04-11 DIAGNOSIS — Z113 Encounter for screening for infections with a predominantly sexual mode of transmission: Secondary | ICD-10-CM | POA: Diagnosis not present

## 2018-04-11 DIAGNOSIS — Z8639 Personal history of other endocrine, nutritional and metabolic disease: Secondary | ICD-10-CM

## 2018-04-11 NOTE — Progress Notes (Signed)
   Subjective:    Patient ID: Michele Rojas, female    DOB: Oct 22, 1985, 31 y.o.   MRN: [id number]  HPI   32 year old female pt presents for STD testing.  She reports she no current symptoms. No vaginal discharge, no dysuria, no fever.  Her husband has a bump on his penis..  Recent onset. Itchy not painful. No ulcer. He does not want an exam. No discharge.  No new sexual contacts. In past dx with HPV  She also has history of hypothyroidism, not sure why she stopped the very low dose med.. She is now very tired and depressed.  Review of Systems  Constitutional: Negative for fatigue and fever.  HENT: Negative for congestion.   Eyes: Negative for pain.  Respiratory: Negative for cough and shortness of breath.   Cardiovascular: Negative for chest pain, palpitations and leg swelling.  Gastrointestinal: Negative for abdominal pain.  Genitourinary: Negative for dysuria and vaginal bleeding.  Musculoskeletal: Negative for back pain.  Neurological: Negative for syncope, light-headedness and headaches.  Psychiatric/Behavioral: Negative for dysphoric mood.       Objective:   Physical Exam  Constitutional: Vital signs are normal. She appears well-developed and well-nourished. She is cooperative.  Non-toxic appearance. She does not appear ill. No distress.  HENT:  Head: Normocephalic.  Right Ear: Hearing, tympanic membrane, external ear and ear canal normal. Tympanic membrane is not erythematous, not retracted and not bulging.  Left Ear: Hearing, tympanic membrane, external ear and ear canal normal. Tympanic membrane is not erythematous, not retracted and not bulging.  Nose: No mucosal edema or rhinorrhea. Right sinus exhibits no maxillary sinus tenderness and no frontal sinus tenderness. Left sinus exhibits no maxillary sinus tenderness and no frontal sinus tenderness.  Mouth/Throat: Uvula is midline, oropharynx is clear and moist and mucous membranes are normal.  Eyes: Pupils are equal,  round, and reactive to light. Conjunctivae, EOM and lids are normal. Lids are everted and swept, no foreign bodies found.  Neck: Trachea normal and normal range of motion. Neck supple. Carotid bruit is not present. No thyroid mass and no thyromegaly present.  Cardiovascular: Normal rate, regular rhythm, S1 normal, S2 normal, normal heart sounds, intact distal pulses and normal pulses. Exam reveals no gallop and no friction rub.  No murmur heard. Pulmonary/Chest: Effort normal and breath sounds normal. No tachypnea. No respiratory distress. She has no decreased breath sounds. She has no wheezes. She has no rhonchi. She has no rales.  Abdominal: Soft. Normal appearance and bowel sounds are normal. There is no tenderness.  Neurological: She is alert.  Skin: Skin is warm, dry and intact. No rash noted.  Psychiatric: Her speech is normal and behavior is normal. Judgment and thought content normal. Her mood appears not anxious. Cognition and memory are normal. She does not exhibit a depressed mood.          Assessment & Plan:

## 2018-04-11 NOTE — Patient Instructions (Signed)
Please stop at the lab to have labs drawn.  

## 2018-04-12 LAB — HEPATITIS PANEL, ACUTE
Hep A IgM: NONREACTIVE
Hep B C IgM: NONREACTIVE
Hepatitis B Surface Ag: NONREACTIVE
Hepatitis C Ab: NONREACTIVE
SIGNAL TO CUT-OFF: 0.02 (ref <1.00)

## 2018-04-12 LAB — T4, FREE: FREE T4: 0.82 ng/dL (ref 0.60–1.60)

## 2018-04-12 LAB — HIV ANTIBODY (ROUTINE TESTING W REFLEX): HIV: NONREACTIVE

## 2018-04-12 LAB — TSH: TSH: 2.16 u[IU]/mL (ref 0.35–4.50)

## 2018-04-12 LAB — C. TRACHOMATIS/N. GONORRHOEAE RNA
C. TRACHOMATIS RNA, TMA: NOT DETECTED
N. gonorrhoeae RNA, TMA: NOT DETECTED

## 2018-04-12 LAB — RPR: RPR: NONREACTIVE

## 2018-04-12 LAB — T3, FREE: T3, Free: 3.6 pg/mL (ref 2.3–4.2)

## 2018-04-30 ENCOUNTER — Emergency Department: Payer: Managed Care, Other (non HMO)

## 2018-04-30 ENCOUNTER — Inpatient Hospital Stay
Admission: EM | Admit: 2018-04-30 | Discharge: 2018-05-03 | DRG: 917 | Disposition: A | Discharge status: Other Acute Inpatient Hospital | Payer: Managed Care, Other (non HMO) | Attending: Internal Medicine | Admitting: Internal Medicine

## 2018-04-30 ENCOUNTER — Other Ambulatory Visit: Payer: Self-pay

## 2018-04-30 DIAGNOSIS — Z818 Family history of other mental and behavioral disorders: Secondary | ICD-10-CM

## 2018-04-30 DIAGNOSIS — Z9189 Other specified personal risk factors, not elsewhere classified: Secondary | ICD-10-CM | POA: Diagnosis present

## 2018-04-30 DIAGNOSIS — Z915 Personal history of self-harm: Secondary | ICD-10-CM

## 2018-04-30 DIAGNOSIS — Z880 Allergy status to penicillin: Secondary | ICD-10-CM

## 2018-04-30 DIAGNOSIS — Z8679 Personal history of other diseases of the circulatory system: Secondary | ICD-10-CM

## 2018-04-30 DIAGNOSIS — Z8619 Personal history of other infectious and parasitic diseases: Secondary | ICD-10-CM

## 2018-04-30 DIAGNOSIS — Z630 Problems in relationship with spouse or partner: Secondary | ICD-10-CM

## 2018-04-30 DIAGNOSIS — Q430 Meckel's diverticulum (displaced) (hypertrophic): Secondary | ICD-10-CM

## 2018-04-30 DIAGNOSIS — T50902A Poisoning by unspecified drugs, medicaments and biological substances, intentional self-harm, initial encounter: Secondary | ICD-10-CM

## 2018-04-30 DIAGNOSIS — F329 Major depressive disorder, single episode, unspecified: Secondary | ICD-10-CM | POA: Diagnosis present

## 2018-04-30 DIAGNOSIS — Z9151 Personal history of suicidal behavior: Secondary | ICD-10-CM

## 2018-04-30 DIAGNOSIS — E785 Hyperlipidemia, unspecified: Secondary | ICD-10-CM | POA: Diagnosis present

## 2018-04-30 DIAGNOSIS — R9431 Abnormal electrocardiogram [ECG] [EKG]: Secondary | ICD-10-CM | POA: Diagnosis present

## 2018-04-30 DIAGNOSIS — K760 Fatty (change of) liver, not elsewhere classified: Secondary | ICD-10-CM | POA: Diagnosis present

## 2018-04-30 DIAGNOSIS — F419 Anxiety disorder, unspecified: Secondary | ICD-10-CM | POA: Diagnosis present

## 2018-04-30 DIAGNOSIS — E039 Hypothyroidism, unspecified: Secondary | ICD-10-CM | POA: Diagnosis present

## 2018-04-30 DIAGNOSIS — T424X2A Poisoning by benzodiazepines, intentional self-harm, initial encounter: Secondary | ICD-10-CM | POA: Diagnosis not present

## 2018-04-30 DIAGNOSIS — Z79899 Other long term (current) drug therapy: Secondary | ICD-10-CM

## 2018-04-30 DIAGNOSIS — F909 Attention-deficit hyperactivity disorder, unspecified type: Secondary | ICD-10-CM | POA: Diagnosis present

## 2018-04-30 DIAGNOSIS — G92 Toxic encephalopathy: Secondary | ICD-10-CM | POA: Diagnosis present

## 2018-04-30 DIAGNOSIS — F17210 Nicotine dependence, cigarettes, uncomplicated: Secondary | ICD-10-CM | POA: Diagnosis present

## 2018-04-30 DIAGNOSIS — F32A Depression, unspecified: Secondary | ICD-10-CM | POA: Diagnosis present

## 2018-04-30 DIAGNOSIS — I10 Essential (primary) hypertension: Secondary | ICD-10-CM | POA: Diagnosis present

## 2018-04-30 DIAGNOSIS — F332 Major depressive disorder, recurrent severe without psychotic features: Secondary | ICD-10-CM | POA: Diagnosis present

## 2018-04-30 DIAGNOSIS — K219 Gastro-esophageal reflux disease without esophagitis: Secondary | ICD-10-CM | POA: Diagnosis present

## 2018-04-30 LAB — COMPREHENSIVE METABOLIC PANEL
ALBUMIN: 4.3 g/dL (ref 3.5–5.0)
ALT: 55 U/L — ABNORMAL HIGH (ref 0–44)
AST: 37 U/L (ref 15–41)
Alkaline Phosphatase: 110 U/L (ref 38–126)
Anion gap: 9 (ref 5–15)
BUN: 6 mg/dL (ref 6–20)
CHLORIDE: 102 mmol/L (ref 98–111)
CO2: 28 mmol/L (ref 22–32)
Calcium: 9.4 mg/dL (ref 8.9–10.3)
Creatinine, Ser: 0.74 mg/dL (ref 0.44–1.00)
GFR calc Af Amer: >60 mL/min (ref >60)
GFR calc non Af Amer: >60 mL/min (ref >60)
GLUCOSE: 103 mg/dL — AB (ref 70–99)
Potassium: 3.9 mmol/L (ref 3.5–5.1)
SODIUM: 139 mmol/L (ref 135–145)
Total Bilirubin: 0.7 mg/dL (ref 0.3–1.2)
Total Protein: 7.4 g/dL (ref 6.5–8.1)

## 2018-04-30 LAB — CBC
HCT: 50.3 % — ABNORMAL HIGH (ref 35.0–47.0)
HEMOGLOBIN: 17.5 g/dL — AB (ref 12.0–16.0)
MCH: 30.7 pg (ref 26.0–34.0)
MCHC: 34.7 g/dL (ref 32.0–36.0)
MCV: 88.4 fL (ref 80.0–100.0)
Platelets: 226 10*3/uL (ref 150–440)
RBC: 5.69 MIL/uL — AB (ref 3.80–5.20)
RDW: 15 % — ABNORMAL HIGH (ref 11.5–14.5)
WBC: 17.3 10*3/uL — AB (ref 3.6–11.0)

## 2018-04-30 LAB — URINE DRUG SCREEN, QUALITATIVE (ARMC ONLY)
AMPHETAMINES, UR SCREEN: POSITIVE — AB
Barbiturates, Ur Screen: NOT DETECTED
Benzodiazepine, Ur Scrn: POSITIVE — AB
Cannabinoid 50 Ng, Ur ~~LOC~~: NOT DETECTED
Cocaine Metabolite,Ur ~~LOC~~: NOT DETECTED
MDMA (ECSTASY) UR SCREEN: NOT DETECTED
Methadone Scn, Ur: NOT DETECTED
Opiate, Ur Screen: NOT DETECTED
PHENCYCLIDINE (PCP) UR S: NOT DETECTED
Tricyclic, Ur Screen: NOT DETECTED

## 2018-04-30 LAB — TROPONIN I
Troponin I: <0.03 ng/mL (ref <0.03)
Troponin I: <0.03 ng/mL (ref <0.03)

## 2018-04-30 LAB — POCT PREGNANCY, URINE: PREG TEST UR: NEGATIVE

## 2018-04-30 LAB — ETHANOL

## 2018-04-30 LAB — SALICYLATE LEVEL: Salicylate Lvl: <7 mg/dL (ref 2.8–30.0)

## 2018-04-30 LAB — ACETAMINOPHEN LEVEL: Acetaminophen (Tylenol), Serum: <10 ug/mL — ABNORMAL LOW (ref 10–30)

## 2018-04-30 NOTE — ED Notes (Signed)
Mother at bedside inquiring if "she is going to be ok." previous RN speaking with mother regarding treatment plan.

## 2018-04-30 NOTE — ED Notes (Signed)
Pt continues to sleep. resps unlabored.  

## 2018-04-30 NOTE — ED Notes (Signed)
Pt's mother has pt's belongings and phone.

## 2018-04-30 NOTE — ED Notes (Signed)
Pt repeatedly getting up, removing monitoring equipment. Sitter now at bedside for safety.

## 2018-04-30 NOTE — ED Provider Notes (Addendum)
[facility] Emergency Department Provider Note   ____________________________________________   First MD Initiated Contact with Patient 04/30/18 2057     (approximate)  I have reviewed the triage vital signs and the nursing notes.   HISTORY  Chief Complaint Drug Overdose   HPI Michele Rojas is a 32 y.o. female who comes in with EMS.  Patient has an intentional overdose of Klonopin and Xanax about 1 hour prior to arrival.  She says she did not want to kill herself but cannot tell me how many pills she took.  She told EMS that she was just tired.  She denies any chest pain or shortness of breath.  Initial EKG showed ST segment depression in 1 and L with ST elevation in lead III only.  Additionally there was some ST segment depression T wave flattening in the lateral chest leads.  Patient was brought back immediately.  She again denied any chest pain or shortness of breath.   Past Medical History:  Diagnosis Date  . ADD (attention deficit disorder)    no meds  . Anxiety   . Constipation   . Depression   . Fatigue   . Fatty liver   . Gallstones   . GERD (gastroesophageal reflux disease)   . History of chicken pox as a child  . History of preterm delivery    2015- 31wks, 2017- 35wks  . HLD (hyperlipidemia)   . HPV (human papilloma virus) infection   . HTN (hypertension)    No meds  . Hx of abnormal cervical Pap smear    ASCUS, LGSIL, CIN I, Colpo  . Hypothyroidism    no meds  . Meckel's diverticulitis   . Mood swings   . Obesity   . Preeclampsia    2015 & 2017 pregnancies  . Right ovarian cyst    2.5cm  . Smoker   . Tobacco abuse   . Vitamin deficiency     Patient Active Problem List   Diagnosis Date Noted  . GERD (gastroesophageal reflux disease) 02/13/2018  . Fatty liver 02/13/2018  . Lower abdominal pain 02/13/2018  . Weight gain, abnormal 07/28/2017  . Routine general medical examination at a health care facility 05/25/2016  .  Lipoma of lower extremity 03/08/2016  . Hyperlipidemia 02/22/2016  . Leukocytosis 02/20/2016  . 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 Essential hypertension 01/07/2016  . Placental abruption 09/23/2015  . Encounter for biometric screening 09/30/2013  . Screening for lipoid disorders 09/28/2013  . Diabetes mellitus screening 09/28/2013  . Hypothyroid 11/04/2011  . Depression 10/04/2011  . HEADACHE 11/18/2009  . Adjustment disorder with mixed anxiety and depressed mood 11/17/2009  . FATIGUE 10/06/2009  . TOBACCO USE 02/19/2009  . CHICKENPOX, HX OF 02/19/2009  . DEFICIENCY OF OTHER VITAMINS 04/29/2008  . OTHER CONSTIPATION 04/29/2008    Past Surgical History:  Procedure Laterality Date  . ACHILLES TENDON SURGERY Bilateral   . BONE MARROW BIOPSY  06/2017  . CESAREAN SECTION N/A 06/09/2014   Procedure: CESAREAN SECTION;  Surgeon: Farrel Gobble. Harrington Challenger, MD;  Location: [facility];  Service: Obstetrics;  Laterality: N/A;  . CESAREAN SECTION N/A 09/22/2015   Procedure: CESAREAN SECTION;  Surgeon: Jerelyn Charles, MD;  Location: [facility];  Service: Obstetrics;  Laterality: N/A;  . CHOLECYSTECTOMY    . CHOLECYSTECTOMY, LAPAROSCOPIC    . DIAGNOSTIC LAPAROSCOPY     removal of meckels diverticulum  . LAPAROSCOPIC SMALL BOWEL RESECTION N/A 11/02/2017   Procedure: LAPAROSCOPIC REMOVAL OF MECKEL'S DIVERTICULUM  ERAS PATHWAY;  Surgeon: Excell Seltzer, MD;  Location: [facility];  Service: General;  Laterality: N/A;  . TUBAL LIGATION    . WISDOM TOOTH EXTRACTION      Prior to Admission medications   Medication Sig Start Date End Date Taking? Authorizing Provider  ALPRAZolam Duanne Moron) 0.5 MG tablet Take 0.5 mg by mouth daily as needed for anxiety.   Yes [provider]  buPROPion (WELLBUTRIN XL) 150 MG 24 hr tablet Take 1 tablet by mouth daily.   Yes [provider]  VYVANSE 50 MG capsule Take 50 mg by mouth daily. 04/21/18  Yes [provider]  cholecalciferol (VITAMIN D) 1000 units tablet  Take 1,000 Units by mouth daily.    [provider]  ranitidine (ZANTAC) 150 MG capsule Take 150 mg by mouth daily.    [provider]    Allergies Amoxicillin  Family History  Problem Relation Age of Onset  . Hypertension Father   . Hyperlipidemia Father   . Prostate cancer Father   . Aneurysm Maternal Grandfather   . Depression Mother        after a car accident  . Colon polyps Mother   . Other Mother        ? CAD /heart disease  . Depression Brother        major depression  . Graves' disease Paternal Uncle   . Alzheimer's disease Paternal Grandmother   . Alzheimer's disease Paternal Grandfather   . Celiac disease Cousin        maternal cousin    Social History Social History   Tobacco Use  . Smoking status: Current Every Day Smoker    Packs/day: 1.00    Types: Cigarettes  . Smokeless tobacco: Never Used  Substance Use Topics  . Alcohol use: No    Alcohol/week: 0.0 standard drinks    Comment: rare  . Drug use: No    Review of Systems  Constitutional: No fever/chills Eyes: No visual changes. ENT: No sore throat. Cardiovascular: Denies chest pain. Respiratory: Denies shortness of breath. Gastrointestinal: No abdominal pain.  No nausea, no vomiting.  No diarrhea.  No constipation. Genitourinary: Negative for dysuria. Musculoskeletal: Negative for back pain. Skin: Negative for rash. Neurological: Negative for headaches, focal weakness  ____________________________________________   PHYSICAL EXAM:  VITAL SIGNS: ED Triage Vitals  Enc Vitals Group     BP 04/30/18 2032 (!) 145/99     Pulse Rate 04/30/18 2032 (!) 113     Resp 04/30/18 2032 (!) 21     Temp 04/30/18 2032 98 F (36.7 C)     Temp Source 04/30/18 2032 Oral     SpO2 04/30/18 2032 98 %     Weight 04/30/18 2032 195 lb (88.5 kg)     Height 04/30/18 2032 '5\' 7"'  (1.702 m)     Head Circumference --      Peak Flow --      Pain Score 04/30/18 2036 0     Pain Loc --      Pain Edu?  --      Excl. in [place]? --     Constitutional: Alert and oriented. Well appearing and in no acute distress.  Very rapidly becomes very sleepy but arousable. Eyes: Conjunctivae are normal. PERRL. EOMI. Head: Atraumatic. Nose: No congestion/rhinnorhea. Mouth/Throat: Mucous membranes are moist.  Oropharynx non-erythematous. Neck: No stridor. Cardiovascular: Normal rate, regular rhythm. Grossly normal heart sounds.  Good peripheral circulation. Respiratory: Normal respiratory effort.  No retractions. Lungs CTAB.  Gastrointestinal: Soft and nontender. No distention. No abdominal bruits. No CVA tenderness. Musculoskeletal: No lower extremity tenderness nor edema.   Neurologic:  Normal speech and language. No gross focal neurologic deficits are appreciated.  Skin:  Skin is warm, dry and intact. No rash noted. Psychiatric: Mood and affect are normal. Speech and behavior are normal.  ____________________________________________   LABS (all labs ordered are listed, but only abnormal results are displayed)  Labs Reviewed  COMPREHENSIVE METABOLIC PANEL - Abnormal; Notable for the following components:      Result Value   Glucose, Bld 103 (*)    ALT 55 (*)    All other components within normal limits  ACETAMINOPHEN LEVEL - Abnormal; Notable for the following components:   Acetaminophen (Tylenol), Serum <10 (*)    All other components within normal limits  CBC - Abnormal; Notable for the following components:   WBC 17.3 (*)    RBC 5.69 (*)    Hemoglobin 17.5 (*)    HCT 50.3 (*)    RDW 15.0 (*)    All other components within normal limits  URINE DRUG SCREEN, QUALITATIVE ([facility] ONLY) - Abnormal; Notable for the following components:   Amphetamines, Ur Screen POSITIVE (*)    Benzodiazepine, Ur Scrn POSITIVE (*)    All other components within normal limits  ETHANOL  SALICYLATE LEVEL  TROPONIN I  TROPONIN I  URINALYSIS, COMPLETE (UACMP) WITH MICROSCOPIC  DIFFERENTIAL  POCT PREGNANCY, URINE    CBG MONITORING, ED  POC URINE PREG, ED   ____________________________________________  EKG  Initial EKG read by Dr. Mable Paris repeat EKG shows sinus tachycardia rate of 118 normal axis there is an S wave in lead III there is some T wave flattening inferiorly slight ST segment depression inferiorly T wave flattening in the lateral chest leads may be slightly better ST segment depression is barely perceptible the ST segment elevation in lead III is gone ____________________________________________  RADIOLOGY  ED MD interpretation:    Official radiology report(s): Dg Chest Portable 1 View  Result Date: 04/30/2018 CLINICAL DATA:  Overdose EXAM: PORTABLE CHEST 1 VIEW COMPARISON:  02/20/2016 FINDINGS: Heart and mediastinal contours are within normal limits. No focal opacities or effusions. No acute bony abnormality. IMPRESSION: No active disease. Electronically Signed   By: Rolm Baptise M.D.   On: 04/30/2018 21:19    ____________________________________________   PROCEDURES  Procedure(s) performed:  Procedures  Critical Care performed:   ____________________________________________   INITIAL IMPRESSION / ASSESSMENT AND PLAN / ED COURSE  Second EKG looks better than first.  Troponins have been negative.  Patient is sleeping comfortably.  At 1145 patient begins to wake up and keeps trying to leave given that she is very unsteady and still very sleepy.  We currently do not have the staff to have a sitter watching her constantly.  We will admit her until psych can see her in the morning.         ____________________________________________   FINAL CLINICAL IMPRESSION(S) / ED DIAGNOSES  Final diagnoses:  Intentional drug overdose, initial encounter [facility])     ED Discharge Orders    None       Note:  This document was prepared using Dragon voice recognition software and may include unintentional dictation errors.    Nena Polio, MD 04/30/18 2357    Nena Polio, MD 04/30/18 [phone]

## 2018-04-30 NOTE — ED Provider Notes (Signed)
ED ECG REPORT I, Darel Hong, the attending physician, personally viewed and interpreted this ECG.  Date: 04/30/2018 EKG Time: 2031 Rate: 118 Rhythm: Sinus tachycardia QRS Axis: normal Intervals: normal ST/T Wave abnormalities: ST elevation in lead III along with depression in V3 and V4 as well as aVL.  This is different when compared with previous EKG on October 27, 2017 Narrative Interpretation: Concerning for ischemia although does not meet STEMI criteria   I was shown this EKG by the triage nurse.  It does show concerning ST elevation isolated to lead III which is not consistent with the patient's symptoms that she is here for a benzodiazepine overdose.  I have asked her to come back to a medical bed and will repeat the EKG now.    Darel Hong, MD 04/30/18 2048

## 2018-04-30 NOTE — ED Notes (Signed)
Pt dressed out into hospital scrubs with this NT and April RN. Pt belongings include: 4 earrings and one black hair tie, placed in specimen cup, one pair of flip-flops, black yoga pants, and Blue panties.

## 2018-04-30 NOTE — ED Triage Notes (Addendum)
Pt arrives to ED via POV from home with c/o intentional overdose on an unknown number of Klonopin and Xanax tablets 1hr PTA. Pt denies SI, but states she did it because "I'm just tired". Pt refuses to provide more details.

## 2018-05-01 ENCOUNTER — Other Ambulatory Visit: Payer: Self-pay

## 2018-05-01 DIAGNOSIS — Z79899 Other long term (current) drug therapy: Secondary | ICD-10-CM | POA: Diagnosis not present

## 2018-05-01 DIAGNOSIS — Z9189 Other specified personal risk factors, not elsewhere classified: Secondary | ICD-10-CM | POA: Diagnosis present

## 2018-05-01 DIAGNOSIS — F419 Anxiety disorder, unspecified: Secondary | ICD-10-CM | POA: Diagnosis present

## 2018-05-01 DIAGNOSIS — Q430 Meckel's diverticulum (displaced) (hypertrophic): Secondary | ICD-10-CM | POA: Diagnosis not present

## 2018-05-01 DIAGNOSIS — R9431 Abnormal electrocardiogram [ECG] [EKG]: Secondary | ICD-10-CM | POA: Diagnosis present

## 2018-05-01 DIAGNOSIS — K219 Gastro-esophageal reflux disease without esophagitis: Secondary | ICD-10-CM | POA: Diagnosis present

## 2018-05-01 DIAGNOSIS — F909 Attention-deficit hyperactivity disorder, unspecified type: Secondary | ICD-10-CM | POA: Diagnosis present

## 2018-05-01 DIAGNOSIS — F17210 Nicotine dependence, cigarettes, uncomplicated: Secondary | ICD-10-CM | POA: Diagnosis present

## 2018-05-01 DIAGNOSIS — Z880 Allergy status to penicillin: Secondary | ICD-10-CM | POA: Diagnosis not present

## 2018-05-01 DIAGNOSIS — Z915 Personal history of self-harm: Secondary | ICD-10-CM

## 2018-05-01 DIAGNOSIS — F332 Major depressive disorder, recurrent severe without psychotic features: Secondary | ICD-10-CM | POA: Diagnosis not present

## 2018-05-01 DIAGNOSIS — T50902A Poisoning by unspecified drugs, medicaments and biological substances, intentional self-harm, initial encounter: Secondary | ICD-10-CM | POA: Diagnosis present

## 2018-05-01 DIAGNOSIS — E039 Hypothyroidism, unspecified: Secondary | ICD-10-CM | POA: Diagnosis present

## 2018-05-01 DIAGNOSIS — T424X2A Poisoning by benzodiazepines, intentional self-harm, initial encounter: Secondary | ICD-10-CM | POA: Diagnosis present

## 2018-05-01 DIAGNOSIS — Z8679 Personal history of other diseases of the circulatory system: Secondary | ICD-10-CM | POA: Diagnosis not present

## 2018-05-01 DIAGNOSIS — Z630 Problems in relationship with spouse or partner: Secondary | ICD-10-CM | POA: Diagnosis not present

## 2018-05-01 DIAGNOSIS — Z818 Family history of other mental and behavioral disorders: Secondary | ICD-10-CM | POA: Diagnosis not present

## 2018-05-01 DIAGNOSIS — G92 Toxic encephalopathy: Secondary | ICD-10-CM | POA: Diagnosis present

## 2018-05-01 DIAGNOSIS — K760 Fatty (change of) liver, not elsewhere classified: Secondary | ICD-10-CM | POA: Diagnosis present

## 2018-05-01 DIAGNOSIS — Z8619 Personal history of other infectious and parasitic diseases: Secondary | ICD-10-CM | POA: Diagnosis not present

## 2018-05-01 DIAGNOSIS — Z9151 Personal history of suicidal behavior: Secondary | ICD-10-CM

## 2018-05-01 LAB — URINALYSIS, COMPLETE (UACMP) WITH MICROSCOPIC
BILIRUBIN URINE: NEGATIVE
Bacteria, UA: NONE SEEN
GLUCOSE, UA: NEGATIVE mg/dL
KETONES UR: NEGATIVE mg/dL
Leukocytes, UA: NEGATIVE
NITRITE: NEGATIVE
PH: 6 (ref 5.0–8.0)
Protein, ur: NEGATIVE mg/dL
SPECIFIC GRAVITY, URINE: 1.003 — AB (ref 1.005–1.030)
WBC, UA: NONE SEEN WBC/hpf (ref 0–5)

## 2018-05-01 LAB — DIFFERENTIAL
BASOS ABS: 0.1 10*3/uL (ref 0–0.1)
Basophils Relative: 1 %
EOS ABS: 0.1 10*3/uL (ref 0–0.7)
Eosinophils Relative: 1 %
LYMPHS PCT: 30 %
Lymphs Abs: 5.1 10*3/uL — ABNORMAL HIGH (ref 1.0–3.6)
Monocytes Absolute: 1.1 10*3/uL — ABNORMAL HIGH (ref 0.2–0.9)
Monocytes Relative: 6 %
NEUTROS PCT: 62 %
Neutro Abs: 10.6 10*3/uL — ABNORMAL HIGH (ref 1.4–6.5)

## 2018-05-01 MED ORDER — LISDEXAMFETAMINE DIMESYLATE 30 MG PO CAPS
50.0000 mg | ORAL_CAPSULE | ORAL | Status: DC
  Administered 2018-05-02 – 2018-05-03 (×2): 50 mg via ORAL
  Filled 2018-05-01 (×2): qty 1

## 2018-05-01 MED ORDER — ONDANSETRON HCL 4 MG PO TABS: 4.0000 mg | ORAL_TABLET | Freq: Four times a day (QID) | ORAL | Status: DC | PRN

## 2018-05-01 MED ORDER — VITAMIN D 1000 UNITS PO TABS
1000.0000 [IU] | ORAL_TABLET | Freq: Every day | ORAL | Status: DC
  Administered 2018-05-01 – 2018-05-03 (×3): 1000 [IU] via ORAL
  Filled 2018-05-01 (×3): qty 1

## 2018-05-01 MED ORDER — ACETAMINOPHEN 325 MG PO TABS
650.0000 mg | ORAL_TABLET | Freq: Four times a day (QID) | ORAL | Status: DC | PRN
  Administered 2018-05-02 (×2): 650 mg via ORAL
  Filled 2018-05-01 (×2): qty 2

## 2018-05-01 MED ORDER — ENOXAPARIN SODIUM 40 MG/0.4ML ~~LOC~~ SOLN
40.0000 mg | SUBCUTANEOUS | Status: DC
  Administered 2018-05-01 – 2018-05-02 (×2): 40 mg via SUBCUTANEOUS
  Filled 2018-05-01 (×2): qty 0.4

## 2018-05-01 MED ORDER — ACETAMINOPHEN 650 MG RE SUPP: 650.0000 mg | Freq: Four times a day (QID) | RECTAL | Status: DC | PRN

## 2018-05-01 MED ORDER — FAMOTIDINE 20 MG PO TABS
20.0000 mg | ORAL_TABLET | Freq: Every day | ORAL | Status: DC
  Administered 2018-05-01 – 2018-05-03 (×3): 20 mg via ORAL
  Filled 2018-05-01 (×3): qty 1

## 2018-05-01 MED ORDER — ONDANSETRON HCL 4 MG/2ML IJ SOLN: 4.0000 mg | Freq: Four times a day (QID) | INTRAMUSCULAR | Status: DC | PRN

## 2018-05-01 NOTE — ED Notes (Signed)
Pt assisted up to void with staff assist.

## 2018-05-01 NOTE — ED Notes (Signed)
Transport to floor room 146.AS

## 2018-05-01 NOTE — ED Notes (Signed)
Parents updated with consent pt. Mother given pt's previously listed removed belongings.

## 2018-05-01 NOTE — H&P (Signed)
[facility] NAME: Michele Rojas    MR#:  [id number]  DATE OF BIRTH:  [birth year]  DATE OF ADMISSION:  04/30/2018  PRIMARY CARE PHYSICIAN: Tower, Wynelle Fanny, MD   REQUESTING/REFERRING PHYSICIAN: Cinda Quest, MD  CHIEF COMPLAINT:   Chief Complaint  Patient presents with  . Drug Overdose    HISTORY OF PRESENT ILLNESS:  Michele Rojas  is a 32 y.o. female who presents with chief complaint as above.  Patient presented to the ED after overdose of benzos.  Patient is largely somnolent, and is arousable only with progressive stimulation such as sternal rub.  She is able to speak when she wakes up.  Initially she had some concerning EKG changes which has since corrected.  She never complained of chest pain.  She admits only to taking benzos, though her urine tox screen is positive for amphetamines as well.  Otherwise her work-up is within normal limits.  Hospitalist called for admission, with psychiatry consult already in place  PAST MEDICAL HISTORY:   Past Medical History:  Diagnosis Date  . ADD (attention deficit disorder)    no meds  . Anxiety   . Constipation   . Depression   . Fatigue   . Fatty liver   . Gallstones   . GERD (gastroesophageal reflux disease)   . History of chicken pox as a child  . History of preterm delivery    2015- 31wks, 2017- 35wks  . HLD (hyperlipidemia)   . HPV (human papilloma virus) infection   . HTN (hypertension)    No meds  . Hx of abnormal cervical Pap smear    ASCUS, LGSIL, CIN I, Colpo  . Hypothyroidism    no meds  . Meckel's diverticulitis   . Mood swings   . Obesity   . Preeclampsia    2015 & 2017 pregnancies  . Right ovarian cyst    2.5cm  . Smoker   . Tobacco abuse   . Vitamin deficiency      PAST SURGICAL HISTORY:   Past Surgical History:  Procedure Laterality Date  . ACHILLES TENDON SURGERY Bilateral   . BONE MARROW BIOPSY  06/2017  . CESAREAN SECTION N/A 06/09/2014   Procedure: CESAREAN SECTION;  Surgeon: Farrel Gobble. Harrington Challenger, MD;  Location: [facility];  Service: Obstetrics;  Laterality: N/A;  . CESAREAN SECTION N/A 09/22/2015   Procedure: CESAREAN SECTION;  Surgeon: Jerelyn Charles, MD;  Location: [facility];  Service: Obstetrics;  Laterality: N/A;  . CHOLECYSTECTOMY    . CHOLECYSTECTOMY, LAPAROSCOPIC    . DIAGNOSTIC LAPAROSCOPY     removal of meckels diverticulum  . LAPAROSCOPIC SMALL BOWEL RESECTION N/A 11/02/2017   Procedure: LAPAROSCOPIC REMOVAL OF MECKEL'S DIVERTICULUM ERAS PATHWAY;  Surgeon: Excell Seltzer, MD;  Location: [facility];  Service: General;  Laterality: N/A;  . TUBAL LIGATION    . WISDOM TOOTH EXTRACTION       SOCIAL HISTORY:   Social History   Tobacco Use  . Smoking status: Current Every Day Smoker    Packs/day: 1.00    Types: Cigarettes  . Smokeless tobacco: Never Used  Substance Use Topics  . Alcohol use: No    Alcohol/week: 0.0 standard drinks    Comment: rare     FAMILY HISTORY:   Family History  Problem Relation Age of Onset  . Hypertension Father   . Hyperlipidemia Father   . Prostate cancer Father   . Aneurysm Maternal Grandfather   . Depression Mother  after a car accident  . Colon polyps Mother   . Other Mother        ? CAD /heart disease  . Depression Brother        major depression  . Graves' disease Paternal Uncle   . Alzheimer's disease Paternal Grandmother   . Alzheimer's disease Paternal Grandfather   . Celiac disease Cousin        maternal cousin     DRUG ALLERGIES:   Allergies  Allergen Reactions  . Amoxicillin Other (See Comments)    REACTION: yeast infection in mouth Has patient had a PCN reaction causing immediate rash, facial/tongue/throat swelling, SOB or lightheadedness with hypotension: No Has patient had a PCN reaction causing severe rash involving mucus membranes or skin necrosis: No Has patient had a PCN reaction that required hospitalization no Has patient had a PCN reaction  occurring within the last 10 years: Yes If all of the above answers are "NO", then may proceed with Cephalosporin use.    MEDICATIONS AT HOME:   Prior to Admission medications   Medication Sig Start Date End Date Taking? Authorizing Provider  ALPRAZolam Duanne Moron) 0.5 MG tablet Take 0.5 mg by mouth daily as needed for anxiety.   Yes [provider]  buPROPion (WELLBUTRIN XL) 150 MG 24 hr tablet Take 1 tablet by mouth daily.   Yes [provider]  cholecalciferol (VITAMIN D) 1000 units tablet Take 1,000 Units by mouth daily.   Yes [provider]  ranitidine (ZANTAC) 150 MG capsule Take 150 mg by mouth daily.   Yes [provider]  VYVANSE 50 MG capsule Take 50 mg by mouth daily. 04/21/18  Yes [provider]    REVIEW OF SYSTEMS:  Review of Systems  Unable to perform ROS: Acuity of condition     VITAL SIGNS:   Vitals:   04/30/18 2300 04/30/18 2330 05/01/18 0000 05/01/18 0015  BP: 112/76 113/83 107/75 104/72  Pulse: 94 (!) 103 84 87  Resp: 15 (!) _0 Temp:      TempSrc:      SpO2: 96% 98% 96% 97%  Weight:      Height:       Wt Readings from Last 3 Encounters:  04/30/18 84.4 kg  04/11/18 88.5 kg  02/13/18 89.7 kg    PHYSICAL EXAMINATION:  Physical Exam  Vitals reviewed. Constitutional: She appears well-developed and well-nourished.  HENT:  Head: Normocephalic and atraumatic.  Mouth/Throat: Oropharynx is clear and moist.  Eyes: Pupils are equal, round, and reactive to light. Conjunctivae and EOM are normal. No scleral icterus.  Neck: Normal range of motion. Neck supple. No JVD present. No thyromegaly present.  Cardiovascular: Normal rate, regular rhythm and intact distal pulses. Exam reveals no gallop and no friction rub.  No murmur heard. Respiratory: Effort normal and breath sounds normal. No respiratory distress. She has no wheezes. She has no rales.  GI: Soft. Bowel sounds are normal. She exhibits no distension. There  is no tenderness.  Musculoskeletal: Normal range of motion. She exhibits no edema.  No arthritis, no gout  Lymphadenopathy:    She has no cervical adenopathy.  Neurological:  Unable to assess due to somnolence  Skin: Skin is warm and dry. No rash noted. No erythema.  Psychiatric:  Unable to assess due to somnolence    LABORATORY PANEL:   CBC Recent Labs  Lab 04/30/18 2040  WBC 17.3*  HGB 17.5*  HCT 50.3*  PLT 226   ------------------------------------------------------------------------------------------------------------------  Chemistries  Recent Labs  Lab 04/30/18 2040  NA 139  K 3.9  CL 102  CO2 28  GLUCOSE 103*  BUN 6  CREATININE 0.74  CALCIUM 9.4  AST 37  ALT 55*  ALKPHOS 110  BILITOT 0.7   ------------------------------------------------------------------------------------------------------------------  Cardiac Enzymes Recent Labs  Lab 04/30/18 2254  TROPONINI <0.03   ------------------------------------------------------------------------------------------------------------------  RADIOLOGY:  Dg Chest Portable 1 View  Result Date: 04/30/2018 CLINICAL DATA:  Overdose EXAM: PORTABLE CHEST 1 VIEW COMPARISON:  02/20/2016 FINDINGS: Heart and mediastinal contours are within normal limits. No focal opacities or effusions. No acute bony abnormality. IMPRESSION: No active disease. Electronically Signed   By: Rolm Baptise M.D.   On: 04/30/2018 21:19    EKG:   Orders placed or performed during the hospital encounter of 04/30/18  . EKG 12-Lead  . EKG 12-Lead  . ED EKG  . ED EKG  . Repeat EKG  . Repeat EKG  . EKG 12-Lead  . EKG 12-Lead  . EKG 12-Lead  . EKG 12-Lead    IMPRESSION AND PLAN:  Principal Problem:   Intentional benzodiazepine overdose ([place]) -patient was IVC by ED physician, patient denies any SI and states that she "wanted to sleep".  She admits to overdose with benzos, urine tox is also positive for amphetamines though she does not  admit to taking this.  She is on Vyvanse per med rec, which could potentially have caused positive for amphetamines on the urine tox screen.  Psychiatry consult in place Active Problems:   Depression -treatment as above   Hyperlipidemia -continue home meds  Chart review performed and case discussed with ED provider. Labs, imaging and/or ECG reviewed by provider and discussed with patient/family. Management plans discussed with the patient and/or family.  DVT PROPHYLAXIS: SubQ lovenox   GI PROPHYLAXIS:  None  ADMISSION STATUS: Inpatient     CODE STATUS: Full Code Status History    Date Active Date Inactive Code Status Order ID Comments User Context   06/09/2014 1627 06/13/2014 1823 Full Code [id number]  Marcial Pacas., MD Inpatient   05/28/2014 1722 06/09/2014 1627 Full Code [id number]  Sanjuana Kava, MD Inpatient      TOTAL TIME TAKING CARE OF THIS PATIENT: 45 minutes.   Michele Rojas 05/01/2018, 12:23 AM  [facility]  Office  [phone]  CC: Primary care physician; Tower, Wynelle Fanny, MD  Note:  This document was prepared using Systems analyst and may include unintentional dictation errors.
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
## 2018-05-01 NOTE — Progress Notes (Signed)
Patient with ADD, GERD and depression admitted to hospital secondary to benzodiazepine overdose. Patient is still groggy, arousable to simple touch and oriented x2. -Vitals are stable.  Chronically elevated WBC count according to parents. -At length conversation with both her parents this afternoon.  According to them she has been under a lot of guilt, marital stress taking care of children.  Seeing an outpatient psychiatrist and counselor. -Continue suicide precautions.  Has a sitter at bedside.  Psych consult today. -Likely discharge to behavioral medicine tomorrow morning

## 2018-05-01 NOTE — ED Notes (Signed)
This NT witnessed pt give April RN consent to tell pt parents information

## 2018-05-02 LAB — BASIC METABOLIC PANEL
Anion gap: 8 (ref 5–15)
BUN: 9 mg/dL (ref 6–20)
CALCIUM: 8.4 mg/dL — AB (ref 8.9–10.3)
CO2: 26 mmol/L (ref 22–32)
CREATININE: 0.74 mg/dL (ref 0.44–1.00)
Chloride: 105 mmol/L (ref 98–111)
GFR calc non Af Amer: >60 mL/min (ref >60)
Glucose, Bld: 112 mg/dL — ABNORMAL HIGH (ref 70–99)
Potassium: 3.6 mmol/L (ref 3.5–5.1)
Sodium: 139 mmol/L (ref 135–145)

## 2018-05-02 LAB — CBC
HEMATOCRIT: 45.3 % (ref 35.0–47.0)
Hemoglobin: 15.6 g/dL (ref 12.0–16.0)
MCH: 31 pg (ref 26.0–34.0)
MCHC: 34.4 g/dL (ref 32.0–36.0)
MCV: 90.1 fL (ref 80.0–100.0)
Platelets: 161 10*3/uL (ref 150–440)
RBC: 5.03 MIL/uL (ref 3.80–5.20)
RDW: 14.8 % — AB (ref 11.5–14.5)
WBC: 9.9 10*3/uL (ref 3.6–11.0)

## 2018-05-02 MED ORDER — NICOTINE 21 MG/24HR TD PT24
21.0000 mg | MEDICATED_PATCH | Freq: Every day | TRANSDERMAL | Status: DC
  Administered 2018-05-02 – 2018-05-03 (×2): 21 mg via TRANSDERMAL
  Filled 2018-05-02 (×2): qty 1

## 2018-05-02 MED ORDER — RAMELTEON 8 MG PO TABS
8.0000 mg | ORAL_TABLET | Freq: Every day | ORAL | Status: DC
  Administered 2018-05-02: 8 mg via ORAL
  Filled 2018-05-02 (×2): qty 1

## 2018-05-02 MED ORDER — BUPROPION HCL ER (XL) 150 MG PO TB24
150.0000 mg | ORAL_TABLET | Freq: Every day | ORAL | Status: DC
  Administered 2018-05-02 – 2018-05-03 (×2): 150 mg via ORAL
  Filled 2018-05-02 (×2): qty 1

## 2018-05-02 NOTE — Consult Note (Signed)
Psychiatry: Orders are already written in the chart and are prepared for transfer to the psychiatric unit.  Patient may be transferred to the psychiatry ward as soon as a bed is assigned and available.  No change to any medication or treatment plan for now.  Continue IV C.

## 2018-05-03 ENCOUNTER — Other Ambulatory Visit: Payer: Self-pay

## 2018-05-03 ENCOUNTER — Inpatient Hospital Stay
Admission: RE | Admit: 2018-05-03 | Discharge: 2018-05-08 | DRG: 882 | Disposition: A | Discharge status: Home / Self Care | Payer: 59 | Source: Intra-hospital | Attending: Psychiatry | Admitting: Psychiatry

## 2018-05-03 DIAGNOSIS — Z8349 Family history of other endocrine, nutritional and metabolic diseases: Secondary | ICD-10-CM

## 2018-05-03 DIAGNOSIS — Z820 Family history of epilepsy and other diseases of the nervous system: Secondary | ICD-10-CM

## 2018-05-03 DIAGNOSIS — Z818 Family history of other mental and behavioral disorders: Secondary | ICD-10-CM

## 2018-05-03 DIAGNOSIS — Z880 Allergy status to penicillin: Secondary | ICD-10-CM | POA: Diagnosis not present

## 2018-05-03 DIAGNOSIS — E785 Hyperlipidemia, unspecified: Secondary | ICD-10-CM | POA: Diagnosis present

## 2018-05-03 DIAGNOSIS — Z87410 Personal history of cervical dysplasia: Secondary | ICD-10-CM

## 2018-05-03 DIAGNOSIS — E039 Hypothyroidism, unspecified: Secondary | ICD-10-CM | POA: Diagnosis present

## 2018-05-03 DIAGNOSIS — Z8371 Family history of colonic polyps: Secondary | ICD-10-CM | POA: Diagnosis not present

## 2018-05-03 DIAGNOSIS — I10 Essential (primary) hypertension: Secondary | ICD-10-CM | POA: Diagnosis present

## 2018-05-03 DIAGNOSIS — F4310 Post-traumatic stress disorder, unspecified: Principal | ICD-10-CM | POA: Diagnosis present

## 2018-05-03 DIAGNOSIS — Z8379 Family history of other diseases of the digestive system: Secondary | ICD-10-CM

## 2018-05-03 DIAGNOSIS — K760 Fatty (change of) liver, not elsewhere classified: Secondary | ICD-10-CM | POA: Diagnosis present

## 2018-05-03 DIAGNOSIS — Q430 Meckel's diverticulum (displaced) (hypertrophic): Secondary | ICD-10-CM | POA: Diagnosis not present

## 2018-05-03 DIAGNOSIS — F909 Attention-deficit hyperactivity disorder, unspecified type: Secondary | ICD-10-CM | POA: Diagnosis present

## 2018-05-03 DIAGNOSIS — F332 Major depressive disorder, recurrent severe without psychotic features: Secondary | ICD-10-CM | POA: Diagnosis present

## 2018-05-03 DIAGNOSIS — F17210 Nicotine dependence, cigarettes, uncomplicated: Secondary | ICD-10-CM | POA: Diagnosis present

## 2018-05-03 DIAGNOSIS — Z9049 Acquired absence of other specified parts of digestive tract: Secondary | ICD-10-CM | POA: Diagnosis not present

## 2018-05-03 DIAGNOSIS — K219 Gastro-esophageal reflux disease without esophagitis: Secondary | ICD-10-CM | POA: Diagnosis present

## 2018-05-03 DIAGNOSIS — F509 Eating disorder, unspecified: Secondary | ICD-10-CM | POA: Diagnosis present

## 2018-05-03 DIAGNOSIS — Z8249 Family history of ischemic heart disease and other diseases of the circulatory system: Secondary | ICD-10-CM

## 2018-05-03 MED ORDER — MAGNESIUM HYDROXIDE 400 MG/5ML PO SUSP
30.0000 mL | Freq: Every day | ORAL | Status: DC | PRN
  Administered 2018-05-04 – 2018-05-06 (×2): 30 mL via ORAL
  Filled 2018-05-03 (×2): qty 30

## 2018-05-03 MED ORDER — VITAMIN D 1000 UNITS PO TABS
1000.0000 [IU] | ORAL_TABLET | Freq: Every day | ORAL | Status: DC
  Administered 2018-05-03 – 2018-05-08 (×6): 1000 [IU] via ORAL
  Filled 2018-05-03 (×6): qty 1

## 2018-05-03 MED ORDER — CLONIDINE HCL 0.1 MG PO TABS: 0.1000 mg | ORAL_TABLET | Freq: Two times a day (BID) | ORAL | Status: DC | PRN

## 2018-05-03 MED ORDER — ACETAMINOPHEN 325 MG PO TABS
650.0000 mg | ORAL_TABLET | Freq: Four times a day (QID) | ORAL | Status: DC | PRN
  Administered 2018-05-04: 650 mg via ORAL
  Filled 2018-05-03: qty 2

## 2018-05-03 MED ORDER — LISDEXAMFETAMINE DIMESYLATE 30 MG PO CAPS
50.0000 mg | ORAL_CAPSULE | ORAL | Status: DC
  Administered 2018-05-04: 50 mg via ORAL
  Filled 2018-05-03: qty 1

## 2018-05-03 MED ORDER — ALUM & MAG HYDROXIDE-SIMETH 200-200-20 MG/5ML PO SUSP: 30.0000 mL | ORAL | Status: DC | PRN

## 2018-05-03 MED ORDER — NICOTINE 21 MG/24HR TD PT24: 21.0000 mg | MEDICATED_PATCH | Freq: Every day | TRANSDERMAL | 0 refills | Status: DC

## 2018-05-03 MED ORDER — LORAZEPAM 1 MG PO TABS
1.0000 mg | ORAL_TABLET | Freq: Once | ORAL | Status: AC
  Administered 2018-05-03: 1 mg via ORAL
  Filled 2018-05-03: qty 1

## 2018-05-03 MED ORDER — FAMOTIDINE 20 MG PO TABS
20.0000 mg | ORAL_TABLET | Freq: Every day | ORAL | Status: DC
  Administered 2018-05-03 – 2018-05-08 (×6): 20 mg via ORAL
  Filled 2018-05-03 (×7): qty 1

## 2018-05-03 MED ORDER — LOPERAMIDE HCL 2 MG PO CAPS: 2.0000 mg | ORAL_CAPSULE | ORAL | Status: AC | PRN

## 2018-05-03 MED ORDER — ONDANSETRON 4 MG PO TBDP: 4.0000 mg | ORAL_TABLET | Freq: Four times a day (QID) | ORAL | Status: AC | PRN

## 2018-05-03 MED ORDER — HYDROXYZINE HCL 50 MG PO TABS
50.0000 mg | ORAL_TABLET | Freq: Four times a day (QID) | ORAL | Status: AC | PRN
  Administered 2018-05-03 – 2018-05-06 (×4): 50 mg via ORAL
  Filled 2018-05-03 (×4): qty 1

## 2018-05-03 MED ORDER — BUPROPION HCL ER (XL) 150 MG PO TB24
150.0000 mg | ORAL_TABLET | Freq: Every day | ORAL | Status: DC
  Administered 2018-05-03 – 2018-05-05 (×3): 150 mg via ORAL
  Filled 2018-05-03 (×3): qty 1

## 2018-05-03 MED ORDER — CHLORDIAZEPOXIDE HCL 25 MG PO CAPS
25.0000 mg | ORAL_CAPSULE | Freq: Four times a day (QID) | ORAL | Status: DC | PRN
  Administered 2018-05-03 – 2018-05-04 (×2): 25 mg via ORAL
  Filled 2018-05-03 (×2): qty 1

## 2018-05-03 MED ORDER — IBUPROFEN 400 MG PO TABS
400.0000 mg | ORAL_TABLET | Freq: Once | ORAL | Status: AC
  Administered 2018-05-03: 400 mg via ORAL
  Filled 2018-05-03: qty 1

## 2018-05-03 MED ORDER — CLONIDINE HCL 0.1 MG PO TABS
0.1000 mg | ORAL_TABLET | Freq: Once | ORAL | Status: AC
  Administered 2018-05-03: 0.1 mg via ORAL
  Filled 2018-05-03: qty 1

## 2018-05-03 NOTE — Plan of Care (Signed)
  Problem: Education: Goal: Knowledge of General Education information will improve Description Including pain rating scale, medication(s)/side effects and non-pharmacologic comfort measures Outcome: Progressing   

## 2018-05-03 NOTE — Plan of Care (Signed)
New admission.   Problem: Education: Goal: Knowledge of [facility] Education information/materials will improve Outcome: Not Progressing Goal: Emotional status will improve Outcome: Not Progressing Goal: Mental status will improve Outcome: Not Progressing Goal: Verbalization of understanding the information provided will improve Outcome: Not Progressing   Problem: Activity: Goal: Interest or engagement in activities will improve Outcome: Not Progressing Goal: Sleeping patterns will improve Outcome: Not Progressing   Problem: Safety: Goal: Periods of time without injury will increase Outcome: Not Progressing   Problem: Coping: Goal: Coping ability will improve Outcome: Not Progressing Goal: Will verbalize feelings Outcome: Not Progressing   Problem: Health Behavior/Discharge Planning: Goal: Ability to make decisions will improve Outcome: Not Progressing Goal: Compliance with therapeutic regimen will improve Outcome: Not Progressing   Problem: Health Behavior/Discharge Planning: Goal: Ability to identify changes in lifestyle to reduce recurrence of condition will improve Outcome: Not Progressing Goal: Identification of resources available to assist in meeting health care needs will improve Outcome: Not Progressing   Problem: Physical Regulation: Goal: Complications related to the disease process, condition or treatment will be avoided or minimized Outcome: Not Progressing

## 2018-05-03 NOTE — BH Assessment (Signed)
Patient is to be admitted to [facility] by Dr. Weber Cooks.  Attending Physician will be Dr. Wonda Olds.   Patient has been assigned to room 316-A, by [place]   Intake Paper Work has been signed and placed on patient chart.
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
## 2018-05-03 NOTE — Progress Notes (Signed)
Pt has bed ready on behavioral unit in room 316. This nurse discontinued IVC orders so that I could print off AVS form. MD Tressia Miners made aware. Will give report to behavioral nurse and transport person to room 316.

## 2018-05-03 NOTE — Plan of Care (Signed)
Newly admitted.Depressed but pleasant and cooperative. Attended group outside with peers.

## 2018-05-03 NOTE — Progress Notes (Signed)
Patient is newly admitted and adjusting well. Sad and depressed but cooperative. Stayed in the milieu, visited with spouse. Attended group outside with peers. Had a snack and requested Vistaril at bedtime. Currently in bed sleeping. No sign of distress. Safety and security precautions maintained.

## 2018-05-03 NOTE — Progress Notes (Signed)
Patient's blood pressure continues to be high, patient stated that she has a history of high blood pressure after having her children, but it started going down, so she hasn't been on blood pressure medication. This Probation officer gave patient PRN Librium. This Probation officer attempted to notify MD, but there was no answer. This Probation officer will report off to RN next shift.

## 2018-05-04 DIAGNOSIS — F4310 Post-traumatic stress disorder, unspecified: Secondary | ICD-10-CM

## 2018-05-04 LAB — LIPID PANEL
CHOL/HDL RATIO: 7.3 ratio
Cholesterol: 211 mg/dL — ABNORMAL HIGH (ref 0–200)
HDL: 29 mg/dL — ABNORMAL LOW (ref >40)
LDL Cholesterol: 107 mg/dL — ABNORMAL HIGH (ref 0–99)
Triglycerides: 376 mg/dL — ABNORMAL HIGH (ref <150)
VLDL: 75 mg/dL — ABNORMAL HIGH (ref 0–40)

## 2018-05-04 LAB — HEMOGLOBIN A1C
HEMOGLOBIN A1C: 5.5 % (ref 4.8–5.6)
Mean Plasma Glucose: 111.15 mg/dL

## 2018-05-04 LAB — TSH: TSH: 6.542 u[IU]/mL — AB (ref 0.350–4.500)

## 2018-05-04 LAB — T4, FREE: Free T4: 0.71 ng/dL — ABNORMAL LOW (ref 0.82–1.77)

## 2018-05-04 MED ORDER — NICOTINE 21 MG/24HR TD PT24
21.0000 mg | MEDICATED_PATCH | Freq: Every day | TRANSDERMAL | Status: DC
  Administered 2018-05-04 – 2018-05-08 (×5): 21 mg via TRANSDERMAL
  Filled 2018-05-04 (×5): qty 1

## 2018-05-04 MED ORDER — LEVOTHYROXINE SODIUM 50 MCG PO TABS
25.0000 ug | ORAL_TABLET | Freq: Every day | ORAL | Status: DC
  Administered 2018-05-05 – 2018-05-08 (×4): 25 ug via ORAL
  Filled 2018-05-04 (×4): qty 1

## 2018-05-04 MED ORDER — TRAZODONE HCL 100 MG PO TABS
100.0000 mg | ORAL_TABLET | Freq: Once | ORAL | Status: AC
  Administered 2018-05-04: 100 mg via ORAL
  Filled 2018-05-04: qty 1

## 2018-05-04 MED ORDER — LISDEXAMFETAMINE DIMESYLATE 30 MG PO CAPS
30.0000 mg | ORAL_CAPSULE | ORAL | Status: DC
  Administered 2018-05-05 – 2018-05-08 (×4): 30 mg via ORAL
  Filled 2018-05-04 (×4): qty 1

## 2018-05-04 NOTE — Plan of Care (Signed)
Pleasant and cooperative in the milieu. Compliant with treatment

## 2018-05-04 NOTE — Discharge Summary (Signed)
[facility] [place] NAME: Female Michele Rojas    MR#:  [id number]  DATE OF BIRTH:  11/15/1985  DATE OF ADMISSION:  04/30/2018   ADMITTING PHYSICIAN: Harrie Foreman, MD  DATE OF DISCHARGE: 05/03/2018 12:29 PM  PRIMARY CARE PHYSICIAN: Tower, Wynelle Fanny, MD   ADMISSION DIAGNOSIS:   Intentional drug overdose, initial encounter ([place]) [T50.902A]  DISCHARGE DIAGNOSIS:   Principal Problem:   Severe recurrent major depression without psychotic features ([place]) Active Problems:   Depression   Hypothyroid   Essential hypertension   Hyperlipidemia   GERD (gastroesophageal reflux disease)   Intentional benzodiazepine overdose ([place])   Suicide attempt ([place])   SECONDARY DIAGNOSIS:   Past Medical History:  Diagnosis Date  . ADD (attention deficit disorder)    no meds  . Anxiety   . Constipation   . Depression   . Fatigue   . Fatty liver   . Gallstones   . GERD (gastroesophageal reflux disease)   . History of chicken pox as a child  . History of preterm delivery    2015- 31wks, 2017- 35wks  . HLD (hyperlipidemia)   . HPV (human papilloma virus) infection   . HTN (hypertension)    No meds  . Hx of abnormal cervical Pap smear    ASCUS, LGSIL, CIN I, [place]  . Hypothyroidism    no meds  . Meckel's diverticulitis   . Mood swings   . Obesity   . Preeclampsia    2015 & 2017 pregnancies  . Right ovarian cyst    2.5cm  . Smoker   . Tobacco abuse   . Vitamin deficiency     HOSPITAL COURSE:   32 year old female with past medical history significant for depression anxiety, fatty liver disease, GERD presents to hospital secondary to benzodiazepine overdose  1.  Acute encephalopathy-secondary to Klonopin overdose secondary to depression -Medically stable at this time.  2.  Major depressive disorder-secondary to marital issues. -Appreciate psych consult.  Suicidal attempt.  On involuntary commitment and suicide precautions -Continue  outpatient Wellbutrin for now -Will be discharged to behavioral medicine unit for further treatment  3.  Tobacco use disorder-continue nicotine patch  4.  ADD-Vyvanse  DISCHARGE CONDITIONS:   Guarded  CONSULTS OBTAINED:   Treatment Team:  Gladstone Lighter, MD Clapacs, Madie Reno, MD  DRUG ALLERGIES:   Allergies  Allergen Reactions  . Amoxicillin Other (See Comments)    REACTION: yeast infection in mouth Has patient had a PCN reaction causing immediate rash, facial/tongue/throat swelling, SOB or lightheadedness with hypotension: No Has patient had a PCN reaction causing severe rash involving mucus membranes or skin necrosis: No Has patient had a PCN reaction that required hospitalization no Has patient had a PCN reaction occurring within the last 10 years: Yes If all of the above answers are "NO", then may proceed with Cephalosporin use.   DISCHARGE MEDICATIONS:   Allergies as of 05/03/2018      Reactions   Amoxicillin Other (See Comments)   REACTION: yeast infection in mouth Has patient had a PCN reaction causing immediate rash, facial/tongue/throat swelling, SOB or lightheadedness with hypotension: No Has patient had a PCN reaction causing severe rash involving mucus membranes or skin necrosis: No Has patient had a PCN reaction that required hospitalization no Has patient had a PCN reaction occurring within the last 10 years: Yes If all of the above answers are "NO", then may proceed with Cephalosporin use.  Medication List    STOP taking these medications   ALPRAZolam 0.5 MG tablet Commonly known as:  XANAX     TAKE these medications   buPROPion 150 MG 24 hr tablet Commonly known as:  WELLBUTRIN XL Take 1 tablet by mouth daily.   cholecalciferol 1000 units tablet Commonly known as:  VITAMIN D Take 1,000 Units by mouth daily.   nicotine 21 mg/24hr patch Commonly known as:  NICODERM CQ - dosed in mg/24 hours Place 1 patch (21 mg total) onto the skin  daily.   ranitidine 150 MG capsule Commonly known as:  ZANTAC Take 150 mg by mouth daily.   VYVANSE 50 MG capsule Generic drug:  lisdexamfetamine Take 50 mg by mouth daily.        DISCHARGE INSTRUCTIONS:   1.  Will be discharged to behavioral medicine unit  DIET:   Regular diet  ACTIVITY:   Activity as tolerated  OXYGEN:   Home Oxygen: No.  Oxygen Delivery: room air  DISCHARGE LOCATION:   Behavioural Medicine Unit   If you experience worsening of your admission symptoms, develop shortness of breath, life threatening emergency, suicidal or homicidal thoughts you must seek medical attention immediately by calling 911 or calling your MD immediately  if symptoms less severe.  You Must read complete instructions/literature along with all the possible adverse reactions/side effects for all the Medicines you take and that have been prescribed to you. Take any new Medicines after you have completely understood and accpet all the possible adverse reactions/side effects.   Please note  You were cared for by a hospitalist during your hospital stay. If you have any questions about your discharge medications or the care you received while you were in the hospital after you are discharged, you can call the unit and asked to speak with the hospitalist on call if the hospitalist that took care of you is not available. Once you are discharged, your primary care physician will handle any further medical issues. Please note that NO REFILLS for any discharge medications will be authorized once you are discharged, as it is imperative that you return to your primary care physician (or establish a relationship with a primary care physician if you do not have one) for your aftercare needs so that they can reassess your need for medications and monitor your lab values.    On the day of Discharge:  VITAL SIGNS:   Blood pressure 120/78, pulse 73, temperature 98 F (36.7 C), temperature source  Oral, resp. rate 13, height 5\' 6"  (1.676 m), weight 86.1 kg, last menstrual period 04/16/2018, SpO2 97 %.  PHYSICAL EXAMINATION:    GENERAL:  32 y.o.-year-old patient lying in the bed with no acute distress.  EYES: Pupils equal, round, reactive to light and accommodation. No scleral icterus. Extraocular muscles intact.  HEENT: Head atraumatic, normocephalic. Oropharynx and nasopharynx clear.  NECK:  Supple, no jugular venous distention. No thyroid enlargement, no tenderness.  LUNGS: Normal breath sounds bilaterally, no wheezing, rales,rhonchi or crepitation. No use of accessory muscles of respiration.  CARDIOVASCULAR: S1, S2 normal. No murmurs, rubs, or gallops.  ABDOMEN: Soft, non-tender, non-distended. Bowel sounds present. No organomegaly or mass.  EXTREMITIES: No pedal edema, cyanosis, or clubbing.  NEUROLOGIC: Cranial nerves II through XII are intact. Muscle strength 5/5 in all extremities. Sensation intact. Gait not checked.  PSYCHIATRIC: The patient is alert and oriented x 3.  SKIN: No obvious rash, lesion, or ulcer.   DATA REVIEW:   CBC Recent Labs  Lab 05/02/18 0608  WBC 9.9  HGB 15.6  HCT 45.3  PLT 161    Chemistries  Recent Labs  Lab 04/30/18 2040 05/02/18 0608  NA 139 139  K 3.9 3.6  CL 102 105  CO2 28 26  GLUCOSE 103* 112*  BUN 6 9  CREATININE 0.74 0.74  CALCIUM 9.4 8.4*  AST 37  --   ALT 55*  --   ALKPHOS 110  --   BILITOT 0.7  --      Microbiology Results  Results for orders placed or performed in visit on 04/11/18  C. trachomatis/N. gonorrhoeae RNA     Status: None   Collection Time: 04/11/18  3:08 PM  Result Value Ref Range Status   C. trachomatis RNA, TMA NOT DETECTED NOT DETECT Final   N. gonorrhoeae RNA, TMA NOT DETECTED NOT DETECT Final    Comment: This test was performed using the [place] ([place].). . The analytical performance characteristics of this  assay, when used to test SurePath specimens have been determined  by [employer]. Marland Kitchen     RADIOLOGY:  No results found.   Management plans discussed with the patient, family and they are in agreement.  CODE STATUS:  Code Status History    Date Active Date Inactive Code Status Order ID Comments User Context   05/01/2018 0136 05/03/2018 1257 Full Code [id number]  Lance Coon, MD ED   06/09/2014 1627 06/13/2014 1823 Full Code [id number]  Marcial Pacas., MD Inpatient   05/28/2014 1722 06/09/2014 1627 Full Code [id number]  Sanjuana Kava, MD Inpatient      TOTAL TIME TAKING CARE OF THIS PATIENT: 38 minutes.    Gladstone Lighter M.D on 05/04/2018 at 3:05 PM  Between 7am to 6pm - Pager - [phone]  After 6pm go to [email or web address] - Technical brewer [facility]  Office  [phone]  CC: Primary care physician; Tower, Wynelle Fanny, MD   Note: This dictation was prepared with Dragon dictation along with smaller phrase technology. Any transcriptional errors that result from this process are unintentional.

## 2018-05-04 NOTE — Progress Notes (Signed)
Michele Rojas has been in the milieu. Alert and oriented, pleasant and cooperative. Denying thoughts of self harm. Denying hallucinations. Attended group with motivation. Complained of anxiety and received vistaril. Had a snack and went to bed but complained of not being able to sleep. Received Trazodone per MD order. No other concerns. Support and encouragements provided. Safety precautions maintained on the unit.
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
## 2018-05-04 NOTE — Plan of Care (Signed)
Patient alert and oriented to person, place and time. Patient presented to nurses station complaining of increased anxiety accompanied with a headache. Denies having thoughts of harming herself and states, "I never truly wanted to harm myself, once I realized what I had done I realized that I went a little too far and I called for help." Present in the milieu with appropriate peer/staff peer, compliant with her treatment plan. Milieu remains safe with q 15 minute safety checks.

## 2018-05-04 NOTE — BHH Group Notes (Signed)
LCSW Group Therapy Note  05/04/2018 1:00 pm  Type of Therapy/Topic:  Group Therapy:  Balance in Life  Participation Level:  Active  Description of Group:    This group will address the concept of balance and how it feels and looks when one is unbalanced. Patients will be encouraged to process areas in their lives that are out of balance and identify reasons for remaining unbalanced. Facilitators will guide patients in utilizing problem-solving interventions to address and correct the stressor making their life unbalanced. Understanding and applying boundaries will be explored and addressed for obtaining and maintaining a balanced life. Patients will be encouraged to explore ways to assertively make their unbalanced needs known to significant others in their lives, using other group members and facilitator for support and feedback.  Therapeutic Goals: 1. Patient will identify two or more emotions or situations they have that consume much of in their lives. 2. Patient will identify signs/triggers that life has become out of balance:  3. Patient will identify two ways to set boundaries in order to achieve balance in their lives:  4. Patient will demonstrate ability to communicate their needs through discussion and/or role plays  Summary of Patient Progress: Asaiah actively participated in today's group discussion on balance in life.  Adaleah shared that anxiety and low self-esteem have consumed much of her life.  She shared that a trigger that her life has become out of balance is that she starts to withdrawn from everyone.  Aaralyn shared that she has struggled to find things to help her achieve consistent balance in her life.  Kyndell shared that medication has been helpful, but doesn't always help.     Therapeutic Modalities:   Cognitive Behavioral Therapy Solution-Focused Therapy Assertiveness Training  Devona Konig, [place] 05/04/2018 3:31 PM

## 2018-05-04 NOTE — Progress Notes (Signed)
Recreation Therapy Notes  INPATIENT RECREATION THERAPY ASSESSMENT  Patient Details Name: Michele Rojas MRN: [id number] DOB: 1986/07/30 Today's Date: 05/04/2018       Information Obtained From: Patient  Able to Participate in Assessment/Interview: Yes  Patient Presentation: Responsive  Reason for Admission (Per Patient): Active Symptoms  Patient Stressors:    Coping Skills:   Building control surveyor, Avoidance  Leisure Interests (2+):  Individual - Reading  Frequency of Recreation/Participation: Monthly  Awareness of Community Resources:  Yes  Community Resources:  Park  Current Use: Yes  If no, Barriers?:    Expressed Interest in [place] of Residence:  [place]  Patient Main Form of Transportation: Musician  Patient Strengths:  Persistant  Patient Identified Areas of Improvement:  Raise my self esteem  Patient Goal for Hospitalization:  Learn new coping skills  Current SI (including self-harm):  No  Current HI:  No  Current AVH: No  Staff Intervention Plan: Group Attendance, Collaborate with Interdisciplinary Treatment Team  Consent to Intern Participation: N/A  Exander Shaul 05/04/2018, 3:57 PM
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
## 2018-05-04 NOTE — BHH Suicide Risk Assessment (Signed)
[facility] Admission Suicide Risk Assessment   Nursing information obtained from:  Patient Demographic factors:  Caucasian Current Mental Status:  NA Loss Factors:  NA Historical Factors:  Family history of suicide, Family history of mental illness or substance abuse Risk Reduction Factors:  Responsible for children under 31 years of age, Sense of responsibility to family, Living with another person, especially a relative  Total Time spent with patient: 1 hour Principal Problem: PTSD (post-traumatic stress disorder) Diagnosis:   Patient Active Problem List   Diagnosis Date Noted  . PTSD (post-traumatic stress disorder) [F43.10] 05/04/2018    Priority: High  . Intentional benzodiazepine overdose ([place]) [T42.4X2A] 05/01/2018  . Severe recurrent major depression without psychotic features ([place]) [F33.2] 05/01/2018  . Suicide attempt ([place]) [T14.91XA] 05/01/2018  . GERD (gastroesophageal reflux disease) [K21.9] 02/13/2018  . Fatty liver [K76.0] 02/13/2018  . Lower abdominal pain [R10.30] 02/13/2018  . Weight gain, abnormal [R63.5] 07/28/2017  . Routine general medical examination at a health care facility [Z00.00] 05/25/2016  . Lipoma of lower extremity [D17.20] 03/08/2016  . Hyperlipidemia [E78.5] 02/22/2016  . Leukocytosis [D72.829] 02/20/2016  . Post partum depression [O99.345, F53.0] 01/08/2016  . Essential hypertension [I10] 01/07/2016  . Placental abruption [O45.90] 09/23/2015  . Encounter for biometric screening [Z01.89] 09/30/2013  . Screening for lipoid disorders [Z13.220] 09/28/2013  . Diabetes mellitus screening [Z13.1] 09/28/2013  . Hypothyroid [E03.9] 11/04/2011  . Depression [F32.9] 10/04/2011  . HEADACHE [R51] 11/18/2009  . Adjustment disorder with mixed anxiety and depressed mood [F43.23] 11/17/2009  . FATIGUE [R53.81, R53.83] 10/06/2009  . TOBACCO USE [F17.200] 02/19/2009  . CHICKENPOX, HX OF [Z91.89] 02/19/2009  . DEFICIENCY OF OTHER VITAMINS [E56.8] 04/29/2008  . OTHER  CONSTIPATION [K59.09] 04/29/2008   Subjective Data: See H&P  Continued Clinical Symptoms:  Alcohol Use Disorder Identification Test Final Score (AUDIT): 6 The "Alcohol Use Disorders Identification Test", Guidelines for Use in Primary Care, Second Edition.  World Pharmacologist [facility]). Score between 0-7:  no or low risk or alcohol related problems. Score between 8-15:  moderate risk of alcohol related problems. Score between 16-19:  high risk of alcohol related problems. Score 20 or above:  warrants further diagnostic evaluation for alcohol dependence and treatment.   CLINICAL FACTORS:   Panic Attacks Previous Psychiatric Diagnoses and Treatments        COGNITIVE FEATURES THAT CONTRIBUTE TO RISK:  None    SUICIDE RISK:   Mild:  Suicidal ideation of limited frequency, intensity, duration, and specificity.  There are no identifiable plans, no associated intent, mild dysphoria and related symptoms, good self-control (both objective and subjective assessment), few other risk factors, and identifiable protective factors, including available and accessible social support.  PLAN OF CARE: See H&P  I certify that inpatient services furnished can reasonably be expected to improve the patient's condition.   Marylin Crosby, MD 05/04/2018, 1:17 PM

## 2018-05-04 NOTE — Progress Notes (Signed)
Recreation Therapy Notes   Date: 05/04/2018  Time: 9:30 am  Location: Craft Room  Behavioral response: Appropriate   Intervention Topic: Communication  Discussion/Intervention:  Group content today was focused on communication. The group defined communication and ways to communicate with others. Individuals stated reason why communication is important and some reasons to communicate with others. Patients expressed if they thought they were good at communicating with others and ways they could improve their communication skills. The group identified important parts of communication and some experiences they have had in the past with communication. The group participated in the intervention "What is that?", where they had a chance to test out their communication skills and identify ways to improve their communication techniques.  .  Clinical Observations/Feedback:  Patient attended group but was pulled by the doctor.  Latana Colin LRT/CTRS         Elio Haden 05/04/2018 11:08 AM

## 2018-05-05 MED ORDER — TRAZODONE HCL 50 MG PO TABS: 50.0000 mg | ORAL_TABLET | Freq: Every evening | ORAL | Status: DC | PRN

## 2018-05-05 MED ORDER — BUPROPION HCL ER (XL) 150 MG PO TB24
300.0000 mg | ORAL_TABLET | Freq: Every day | ORAL | Status: DC
  Administered 2018-05-06 – 2018-05-08 (×3): 300 mg via ORAL
  Filled 2018-05-05 (×3): qty 2

## 2018-05-05 MED ORDER — ESCITALOPRAM OXALATE 10 MG PO TABS
10.0000 mg | ORAL_TABLET | Freq: Every day | ORAL | Status: DC
  Administered 2018-05-05 – 2018-05-08 (×4): 10 mg via ORAL
  Filled 2018-05-05 (×4): qty 1

## 2018-05-05 MED ORDER — TRAZODONE HCL 100 MG PO TABS
100.0000 mg | ORAL_TABLET | Freq: Every evening | ORAL | Status: DC | PRN
  Administered 2018-05-05 – 2018-05-07 (×3): 100 mg via ORAL
  Filled 2018-05-05 (×3): qty 1

## 2018-05-05 NOTE — BHH Group Notes (Signed)
05/05/2018 1PM  Type of Therapy and Topic:  Group Therapy:  Feelings around Relapse and Recovery  Participation Level:  Active   Description of Group:    Patients in this group will discuss emotions they experience before and after a relapse. They will process how experiencing these feelings, or avoidance of experiencing them, relates to having a relapse. Facilitator will guide patients to explore emotions they have related to recovery. Patients will be encouraged to process which emotions are more powerful. They will be guided to discuss the emotional reaction significant others in their lives may have to patients' relapse or recovery. Patients will be assisted in exploring ways to respond to the emotions of others without this contributing to a relapse.  Therapeutic Goals: 1. Patient will identify two or more emotions that lead to a relapse for them 2. Patient will identify two emotions that result when they relapse 3. Patient will identify two emotions related to recovery 4. Patient will demonstrate ability to communicate their needs through discussion and/or role plays   Summary of Patient Progress: Actively and appropriately engaged in the group. Patient was able to provide support and validation to other group members.Patient practiced active listening when interacting with the facilitator and other group members. Patient is still in the process of obtaining treatment goals.      Therapeutic Modalities:   Cognitive Behavioral Therapy Solution-Focused Therapy Assertiveness Training Relapse Prevention Therapy   Darin Engels, [place] 05/05/2018 2:29 PM

## 2018-05-05 NOTE — Plan of Care (Signed)
Data: Patient is appropriate and cooperative to assessment. Patient denies SI/HI/AVH. Patient has completed daily self inventory worksheet. Patient reports mold anxiety.  Patient rates depression "0/10" , feelings of hopelessness "0/10" and anxiety "3/10" Patients goal for today is "going home."  Action:  Q x 15 minute observation checks were completed for safety. Patient was provided with education on medications. Patient was offered support and encouragement. Patient was given scheduled medications. Patient  was encourage to attend groups, participate in unit activities and continue with plan of care.     Response:  Patient has no complaints at this time. Patient is receptive to treatment and safety maintained on unit.    Problem: Education: Goal: Knowledge of [facility] Education information/materials will improve Outcome: Progressing Goal: Mental status will improve Outcome: Progressing   Problem: Safety: Goal: Periods of time without injury will increase Outcome: Progressing

## 2018-05-05 NOTE — Plan of Care (Addendum)
Patient found with visitor upon my arrival. Patient is visible and social this evening. Patient mood is anxious, affect full range. Denies SI/HI/AVH. Denies pain. Reports eating and voiding adequately. Compliant with HS medications and staff direction. Given Vistaril for anxiety and Trazodone for sleep with positive results. Q 15 minute checks maintained. Will continue to monitor throughout the shift. Patient slept 6.75 hours. No apparent distress. Compliant with am Vyvanse and Synthroid. Will endorse care to oncoming shift.  Problem: Education: Goal: Knowledge of [facility] Education information/materials will improve Outcome: Progressing Goal: Emotional status will improve Outcome: Progressing Goal: Mental status will improve Outcome: Progressing Goal: Verbalization of understanding the information provided will improve Outcome: Progressing   Problem: Activity: Goal: Interest or engagement in activities will improve Outcome: Progressing Goal: Sleeping patterns will improve Outcome: Progressing

## 2018-05-05 NOTE — Tx Team (Addendum)
Interdisciplinary Treatment and Diagnostic Plan Update  05/05/2018 Time of Session: 11:10 AM Michele Rojas MRN: [id number]  Principal Diagnosis: PTSD (post-traumatic stress disorder)  Secondary Diagnoses: Principal Problem:   PTSD (post-traumatic stress disorder) Active Problems:   Severe recurrent major depression without psychotic features ([place])   Current Medications:  Current Facility-Administered Medications  Medication Dose Route Frequency Provider Last Rate Last Dose  . acetaminophen (TYLENOL) tablet 650 mg  650 mg Oral Q6H PRN Clapacs, Madie Reno, MD   650 mg at 05/04/18 1400  . alum & mag hydroxide-simeth (MAALOX/MYLANTA) 200-200-20 MG/5ML suspension 30 mL  30 mL Oral Q4H PRN Clapacs, John T, MD      . Derrill Memo ON 05/06/2018] buPROPion (WELLBUTRIN XL) 24 hr tablet 300 mg  300 mg Oral Daily McNew, Tyson Babinski, MD      . cholecalciferol (VITAMIN D) tablet 1,000 Units  1,000 Units Oral Daily Clapacs, Madie Reno, MD   1,000 Units at 05/05/18 0847  . cloNIDine (CATAPRES) tablet 0.1 mg  0.1 mg Oral BID PRN McNew, Holly R, MD      . escitalopram (LEXAPRO) tablet 10 mg  10 mg Oral Daily McNew, Tyson Babinski, MD   10 mg at 05/05/18 1105  . famotidine (PEPCID) tablet 20 mg  20 mg Oral Daily Clapacs, John T, MD   20 mg at 05/05/18 0847  . hydrOXYzine (ATARAX/VISTARIL) tablet 50 mg  50 mg Oral Q6H PRN Marylin Crosby, MD   50 mg at 05/04/18 2133  . levothyroxine (SYNTHROID, LEVOTHROID) tablet 25 mcg  25 mcg Oral QAC breakfast Marylin Crosby, MD   25 mcg at 05/05/18 0847  . lisdexamfetamine (VYVANSE) capsule 30 mg  30 mg Oral Roxy Manns, Tyson Babinski, MD   30 mg at 05/05/18 0350  . loperamide (IMODIUM) capsule 2-4 mg  2-4 mg Oral PRN McNew, Tyson Babinski, MD      . magnesium hydroxide (MILK OF MAGNESIA) suspension 30 mL  30 mL Oral Daily PRN Clapacs, John T, MD   30 mL at 05/04/18 1156  . nicotine (NICODERM CQ - dosed in mg/24 hours) patch 21 mg  21 mg Transdermal Daily McNew, Tyson Babinski, MD   21 mg at 05/05/18 0846  .  ondansetron (ZOFRAN-ODT) disintegrating tablet 4 mg  4 mg Oral Q6H PRN McNew, Tyson Babinski, MD      . traZODone (DESYREL) tablet 100 mg  100 mg Oral QHS PRN McNew, Tyson Babinski, MD       PTA Medications: Medications Prior to Admission  Medication Sig Dispense Refill Last Dose  . buPROPion (WELLBUTRIN XL) 150 MG 24 hr tablet Take 1 tablet by mouth daily.   unknown at unknown  . cholecalciferol (VITAMIN D) 1000 units tablet Take 1,000 Units by mouth daily.   unknown at unknown  . nicotine (NICODERM CQ - DOSED IN MG/24 HOURS) 21 mg/24hr patch Place 1 patch (21 mg total) onto the skin daily. 28 patch 0   . ranitidine (ZANTAC) 150 MG capsule Take 150 mg by mouth daily.   unknown at unknown  . VYVANSE 50 MG capsule Take 50 mg by mouth daily.  0 unknown at unknown    Patient Stressors: Marital or family conflict Substance abuse  Patient Strengths: Ability for insight General fund of knowledge Motivation for treatment/growth Supportive family/friends  Treatment Modalities: Medication Management, Group therapy, Case management,  1 to 1 session with clinician, Psychoeducation, Recreational therapy.   Physician Treatment Plan for Primary Diagnosis: PTSD (post-traumatic stress disorder) Long  Term Goal(s): Improvement in symptoms so as ready for discharge   Short Term Goals: Ability to disclose and discuss suicidal ideas Ability to demonstrate self-control will improve  Medication Management: Evaluate patient's response, side effects, and tolerance of medication regimen.  Therapeutic Interventions: 1 to 1 sessions, Unit Group sessions and Medication administration.  Evaluation of Outcomes: Progressing  Physician Treatment Plan for Secondary Diagnosis: Principal Problem:   PTSD (post-traumatic stress disorder) Active Problems:   Severe recurrent major depression without psychotic features ([place])  Long Term Goal(s): Improvement in symptoms so as ready for discharge   Short Term Goals: Ability to  disclose and discuss suicidal ideas Ability to demonstrate self-control will improve     Medication Management: Evaluate patient's response, side effects, and tolerance of medication regimen.  Therapeutic Interventions: 1 to 1 sessions, Unit Group sessions and Medication administration.  Evaluation of Outcomes: Progressing   RN Treatment Plan for Primary Diagnosis: PTSD (post-traumatic stress disorder) Long Term Goal(s): Knowledge of disease and therapeutic regimen to maintain health will improve  Short Term Goals: Ability to demonstrate self-control, Ability to verbalize feelings will improve, Ability to identify and develop effective coping behaviors will improve and Compliance with prescribed medications will improve  Medication Management: RN will administer medications as ordered by provider, will assess and evaluate patient's response and provide education to patient for prescribed medication. RN will report any adverse and/or side effects to prescribing provider.  Therapeutic Interventions: 1 on 1 counseling sessions, Psychoeducation, Medication administration, Evaluate responses to treatment, Monitor vital signs and CBGs as ordered, Perform/monitor CIWA, COWS, AIMS and Fall Risk screenings as ordered, Perform wound care treatments as ordered.  Evaluation of Outcomes: Progressing   LCSW Treatment Plan for Primary Diagnosis: PTSD (post-traumatic stress disorder) Long Term Goal(s): Safe transition to appropriate next level of care at discharge, Engage patient in therapeutic group addressing interpersonal concerns.  Short Term Goals: Engage patient in aftercare planning with referrals and resources and Increase skills for wellness and recovery  Therapeutic Interventions: Assess for all discharge needs, 1 to 1 time with Social worker, Explore available resources and support systems, Assess for adequacy in community support network, Educate family and significant other(s) on suicide  prevention, Complete Psychosocial Assessment, Interpersonal group therapy.  Evaluation of Outcomes: Progressing   Progress in Treatment: Attending groups: Yes. Participating in groups: Yes. Taking medication as prescribed: Yes. Toleration medication: Yes. Family/Significant other contact made: No, will contact:  CSW will contact identified support person Patient understands diagnosis: Yes. Discussing patient identified problems/goals with staff: Yes. Medical problems stabilized or resolved: Yes. Denies suicidal/homicidal ideation: Yes. Issues/concerns per patient self-inventory: No. Other: n/a  New problem(s) identified: No, Describe:  No new problems identified.  New Short Term/Long Term Goal(s):  Patient Goals:  "Focus on myself, build up my self-esteem and learn some better coping skills"  Discharge Plan or Barriers:   Reason for Continuation of Hospitalization: Anxiety Depression Medication stabilization  Estimated Length of Stay: 3-5 days  Attendees: Patient: Michele Rojas 05/05/2018 1:36 PM  Physician: Amador Cunas, MD 05/05/2018 1:36 PM  Nursing: Jerry Caras, RN 05/05/2018 1:36 PM  RN Care Manager: 05/05/2018 1:36 PM  Social Worker: Derrek Gu, LCSW 05/05/2018 1:36 PM  Recreational Therapist: Roanna Epley, LRT 05/05/2018 1:36 PM  Other: Darin Engels, [place] 05/05/2018 1:36 PM  Other:  05/05/2018 1:36 PM  Other: 05/05/2018 1:36 PM    Scribe for Treatment Team: Devona Konig, LCSW 05/05/2018 1:36 PM

## 2018-05-05 NOTE — Progress Notes (Signed)
Recreation Therapy Notes   Date: 05/05/2018  Time: 9:30 am  Location: Craft Room  Behavioral response: Appropriate  Intervention Topic: Stress  Discussion/Intervention:  Group content on today was focused on stress. The group defined stress and ways to cope with stress. Participants expressed how they know when they are stresses out. Individuals described the different ways they have to cope with stress. The group stated reasons why it is important to cope with stress. Patient explained what good stress is and some examples. The group participated in the intervention "Stress Management Jeopardy". Individuals were separated into two group and answered questions related to stress.  Clinical Observations/Feedback:  Patient came to group and stated she deals with stress by removing herself from the situation. Individual was social with peers and staff while participating in the intervention. [place] Marney LRT/CTRS        Otha Monical 05/05/2018 12:45 PM

## 2018-05-05 NOTE — Progress Notes (Signed)
[facility] MD Progress Note  05/05/2018 11:49 AM Michele Rojas  MRN:  [id number]   Subjective:  Pt states that she is feeling okay today. She did have a lot of anxiety last night. There was an agitated peer on the unit which scared her. She slept ok though. She states that her mood is improving. She is attending groups and finds them helpful. She is talking in each group and trying to open up. She denies SI or thoughts of self harm. We discussed medication options. Recommended her to be on an SSRI for anxiety and PTSD. We discussed that Wellbutrin can be helpful for depression but can worsen anxiety. Also discussed that we lowered dose of Vyvanse and did not continue Adderall because this is way too much stimulant.  Her blood pressure looks better today.   Principal Problem: PTSD (post-traumatic stress disorder) Diagnosis:   Patient Active Problem List   Diagnosis Date Noted  . PTSD (post-traumatic stress disorder) [F43.10] 05/04/2018    Priority: High  . Intentional benzodiazepine overdose ([place]) [T42.4X2A] 05/01/2018  . Severe recurrent major depression without psychotic features ([place]) [F33.2] 05/01/2018  . Suicide attempt ([place]) [T14.91XA] 05/01/2018  . GERD (gastroesophageal reflux disease) [K21.9] 02/13/2018  . Fatty liver [K76.0] 02/13/2018  . Lower abdominal pain [R10.30] 02/13/2018  . Weight gain, abnormal [R63.5] 07/28/2017  . Routine general medical examination at a health care facility [Z00.00] 05/25/2016  . Lipoma of lower extremity [D17.20] 03/08/2016  . Hyperlipidemia [E78.5] 02/22/2016  . Leukocytosis [D72.829] 02/20/2016  . Post partum depression [O99.345, F53.0] 01/08/2016  . Essential hypertension [I10] 01/07/2016  . Placental abruption [O45.90] 09/23/2015  . Encounter for biometric screening [Z01.89] 09/30/2013  . Screening for lipoid disorders [Z13.220] 09/28/2013  . Diabetes mellitus screening [Z13.1] 09/28/2013  . Hypothyroid [E03.9] 11/04/2011  . Depression [F32.9]  10/04/2011  . HEADACHE [R51] 11/18/2009  . Adjustment disorder with mixed anxiety and depressed mood [F43.23] 11/17/2009  . FATIGUE [R53.81, R53.83] 10/06/2009  . TOBACCO USE [F17.200] 02/19/2009  . CHICKENPOX, HX OF [Z91.89] 02/19/2009  . DEFICIENCY OF OTHER VITAMINS [E56.8] 04/29/2008  . OTHER CONSTIPATION [K59.09] 04/29/2008   Total Time spent with patient: 20 minutes  Past Psychiatric History: See h&P  Past Medical History:  Past Medical History:  Diagnosis Date  . ADD (attention deficit disorder)    no meds  . Anxiety   . Constipation   . Depression   . Fatigue   . Fatty liver   . Gallstones   . GERD (gastroesophageal reflux disease)   . History of chicken pox as a child  . History of preterm delivery    2015- 31wks, 2017- 35wks  . HLD (hyperlipidemia)   . HPV (human papilloma virus) infection   . HTN (hypertension)    No meds  . Hx of abnormal cervical Pap smear    ASCUS, LGSIL, CIN I, [place]  . Hypothyroidism    no meds  . Meckel's diverticulitis   . Mood swings   . Obesity   . Preeclampsia    2015 & 2017 pregnancies  . Right ovarian cyst    2.5cm  . Smoker   . Tobacco abuse   . Vitamin deficiency     Past Surgical History:  Procedure Laterality Date  . ACHILLES TENDON SURGERY Bilateral   . BONE MARROW BIOPSY  06/2017  . CESAREAN SECTION N/A 06/09/2014   Procedure: CESAREAN SECTION;  Surgeon: Farrel Gobble. Harrington Challenger, MD;  Location: [facility];  Service: Obstetrics;  Laterality: N/A;  .  CESAREAN SECTION N/A 09/22/2015   Procedure: CESAREAN SECTION;  Surgeon: Jerelyn Charles, MD;  Location: [facility];  Service: Obstetrics;  Laterality: N/A;  . CHOLECYSTECTOMY    . CHOLECYSTECTOMY, LAPAROSCOPIC    . DIAGNOSTIC LAPAROSCOPY     removal of meckels diverticulum  . LAPAROSCOPIC SMALL BOWEL RESECTION N/A 11/02/2017   Procedure: LAPAROSCOPIC REMOVAL OF MECKEL'S DIVERTICULUM ERAS PATHWAY;  Surgeon: Excell Seltzer, MD;  Location: [facility];  Service: General;  Laterality: N/A;  .  TUBAL LIGATION    . WISDOM TOOTH EXTRACTION     Family History:  Family History  Problem Relation Age of Onset  . Hypertension Father   . Hyperlipidemia Father   . Prostate cancer Father   . Aneurysm Maternal Grandfather   . Depression Mother        after a car accident  . Colon polyps Mother   . Other Mother        ? CAD /heart disease  . Depression Brother        major depression  . Graves' disease Paternal Uncle   . Alzheimer's disease Paternal Grandmother   . Alzheimer's disease Paternal Grandfather   . Celiac disease Cousin        maternal cousin   Family Psychiatric  History: See H&P Social History:  Social History   Substance and Sexual Activity  Alcohol Use No  . Alcohol/week: 0.0 standard drinks   Comment: rare     Social History   Substance and Sexual Activity  Drug Use No    Social History   Socioeconomic History  . Marital status: Married    Spouse name: Not on file  . Number of children: 2  . Years of education: Not on file  . Highest education level: Not on file  Occupational History  . Occupation: [employer]  Social Needs  . Financial resource strain: Not on file  . Food insecurity:    Worry: Not on file    Inability: Not on file  . Transportation needs:    Medical: Not on file    Non-medical: Not on file  Tobacco Use  . Smoking status: Current Every Day Smoker    Packs/day: 1.00    Types: Cigarettes  . Smokeless tobacco: Never Used  Substance and Sexual Activity  . Alcohol use: No    Alcohol/week: 0.0 standard drinks    Comment: rare  . Drug use: No  . Sexual activity: Yes  Lifestyle  . Physical activity:    Days per week: Not on file    Minutes per session: Not on file  . Stress: Not on file  Relationships  . Social connections:    Talks on phone: Not on file    Gets together: Not on file    Attends religious service: Not on file    Active member of club or organization: Not on file    Attends meetings of clubs or  organizations: Not on file    Relationship status: Not on file  Other Topics Concern  . Not on file  Social History Narrative   GYN- Dr. 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 acetaminophen (TYLENOL) tablet 650 mg  650 mg Oral Q6H PRN Clapacs, Madie Reno, MD   650 mg at 05/04/18 1400  . alum & mag hydroxide-simeth (MAALOX/MYLANTA) 200-200-20 MG/5ML suspension 30 mL  30 mL Oral Q4H PRN Clapacs, John T, MD      . Derrill Memo ON 05/06/2018] buPROPion (WELLBUTRIN XL) 24 hr tablet 300 mg  300 mg Oral Daily Srihan Brutus, Tyson Babinski, MD      . cholecalciferol (VITAMIN D) tablet 1,000 Units  1,000 Units Oral Daily Clapacs, Madie Reno, MD   1,000 Units at 05/05/18 0847  . cloNIDine (CATAPRES) tablet 0.1 mg  0.1 mg Oral BID PRN Alea Ryer R, MD      . escitalopram (LEXAPRO) tablet 10 mg  10 mg Oral Daily Jocabed Cheese, Tyson Babinski, MD   10 mg at 05/05/18 1105  . famotidine (PEPCID) tablet 20 mg  20 mg Oral Daily Clapacs, John T, MD   20 mg at 05/05/18 0847  . hydrOXYzine (ATARAX/VISTARIL) tablet 50 mg  50 mg Oral Q6H PRN Marylin Crosby, MD   50 mg at 05/04/18 2133  . levothyroxine (SYNTHROID, LEVOTHROID) tablet 25 mcg  25 mcg Oral QAC breakfast Marylin Crosby, MD   25 mcg at 05/05/18 0847  . lisdexamfetamine (VYVANSE) capsule 30 mg  30 mg Oral Roxy Manns, Tyson Babinski, MD   30 mg at 05/05/18 3704  . loperamide (IMODIUM) capsule 2-4 mg  2-4 mg Oral PRN Branston Halsted, Tyson Babinski, MD      . magnesium hydroxide (MILK OF MAGNESIA) suspension 30 mL  30 mL Oral Daily PRN Clapacs, John T, MD   30 mL at 05/04/18 1156  . nicotine (NICODERM CQ - dosed in mg/24 hours) patch 21 mg  21 mg Transdermal Daily Estelle Greenleaf, Tyson Babinski, MD   21 mg at 05/05/18  0846  . ondansetron (ZOFRAN-ODT) disintegrating tablet 4 mg  4 mg Oral Q6H PRN Rafael Quesada, Tyson Babinski, MD      . traZODone (DESYREL) tablet 100 mg  100 mg Oral QHS PRN Ostin Mathey, Tyson Babinski, MD        Lab Results:  Results for orders placed or performed during the hospital encounter of 05/03/18 (from the past 48 hour(s))  Hemoglobin A1c     Status: None   Collection Time: 05/04/18  6:48 AM  Result Value Ref Range   Hgb A1c MFr Bld 5.5 4.8 - 5.6 %    Comment: (NOTE) Pre diabetes:          5.7%-6.4% Diabetes:              >6.4% Glycemic control for   <7.0% adults with diabetes    Mean Plasma Glucose 111.15 mg/dL    Comment: Performed at [facility], [place] [address], [place], [place] [address]  Lipid panel     Status: Abnormal   Collection Time: 05/04/18  6:48 AM  Result Value Ref Range   Cholesterol 211 (H) 0 - 200 mg/dL   Triglycerides 376 (H) <150 mg/dL   HDL 29 (L) >40 mg/dL   Total CHOL/HDL Ratio 7.3 RATIO   VLDL 75 (H) 0 - 40 mg/dL   LDL Cholesterol 107 (H) 0 - 99 mg/dL    Comment:        Total Cholesterol/HDL:CHD Risk Coronary Heart Disease Risk Table  Men   Women  1/2 Average Risk   3.4   3.3  Average Risk       5.0   4.4  2 X Average Risk   9.6   7.1  3 X Average Risk  23.4   11.0        Use the calculated Patient Ratio above and the CHD Risk Table to determine the patient's CHD Risk.        ATP III CLASSIFICATION (LDL):  <100     mg/dL   Optimal  100-129  mg/dL   Near or Above                    Optimal  130-159  mg/dL   Borderline  160-189  mg/dL   High  >190     mg/dL   Very High Performed at [facility], [address] [place], [place] [address]   TSH     Status: Abnormal   Collection Time: 05/04/18  6:48 AM  Result Value Ref Range   TSH 6.542 (H) 0.350 - 4.500 uIU/mL    Comment: Performed by a 3rd Generation assay with a functional sensitivity of <=0.01 uIU/mL. Performed at [facility], [address]  [place], [place] [address]   T4, free     Status: Abnormal   Collection Time: 05/04/18  6:48 AM  Result Value Ref Range   Free T4 0.71 (L) 0.82 - 1.77 ng/dL    Comment: (NOTE) Biotin ingestion may interfere with free T4 tests. If the results are inconsistent with the TSH level, previous test results, or the clinical presentation, then consider biotin interference. If needed, order repeat testing after stopping biotin. Performed at [facility], [address] [place], [place] [address]     Blood Alcohol level:  Lab Results  Component Value Date   [facility] <10 32/20/2542    Metabolic Disorder Labs: Lab Results  Component Value Date   HGBA1C 5.5 05/04/2018   MPG 111.15 05/04/2018   MPG 111 08/12/2016   Lab Results  Component Value Date   PROLACTIN 6.4 07/13/2016   Lab Results  Component Value Date   CHOL 211 (H) 05/04/2018   TRIG 376 (H) 05/04/2018   HDL 29 (L) 05/04/2018   CHOLHDL 7.3 05/04/2018   VLDL 75 (H) 05/04/2018   LDLCALC 107 (H) 05/04/2018    Physical Findings: AIMS:  , ,  ,  ,    CIWA:  CIWA-Ar Total: 0 COWS:  COWS Total Score: 1  Musculoskeletal: Strength & Muscle Tone: within normal limits Gait & Station: normal Patient leans: N/A  Psychiatric Specialty Exam: Physical Exam  Nursing note and vitals reviewed.   Review of Systems  All other systems reviewed and are negative.   Blood pressure 115/85, pulse 94, temperature 97.6 F (36.4 C), temperature source Oral, resp. rate 18, height '5\' 6"'  (1.676 m), weight 87.1 kg, last menstrual period 04/16/2018, SpO2 98 %.Body mass index is 30.99 kg/m.  General Appearance: Casual  Eye Contact:  Good  Speech:  Clear and Coherent  Volume:  Normal  Mood:  Euthymic  Affect:  Appropriate  Thought Process:  Coherent and Goal Directed  Orientation:  Full (Time, Place, and Person)  Thought Content:  Logical  Suicidal Thoughts:  No  Homicidal Thoughts:  No  Memory:  Immediate;   Fair  Judgement:  Fair   Insight:  Good  Psychomotor Activity:  Normal  Concentration:  Concentration: Fair  Recall:  [place] of Knowledge:  Fair  Language:  Fair  Akathisia:  No      Assets:  Resilience  ADL's:  Intact  Cognition:  WNL  Sleep:  Number of Hours: 7.15     Treatment Plan Summary: 32 yo female admitted after suicide attempt by overdose on benzos. Mood is improving. She is still anxious. Discussed starting SSRI for anxiety as Wellbutrin can worsen anxiety. Also discussed high doses of stimulants which can affect BP and HR and also cause anxiety.   Plan:  PTSD/Mood -Start Lexapro 10 mg daily for anxiety and PTSD -Continue Wellbutrin and increase back to 300 mg per her request as this has been very helpful for mood  ADHD -Continue lower dose of Vyvanse 30 mg. BP better today -Recommend not restarting Adderall since being on Vyvanse  Hypothyroid -Continue Synthroid 25 mcg  HTN -Better today  Dispo -discharge home when stable. She has APRN and therapist she sees regularly  Marylin Crosby, MD 05/05/2018, 11:49 AM

## 2018-05-06 MED ORDER — HYDROXYZINE HCL 50 MG PO TABS
50.0000 mg | ORAL_TABLET | Freq: Four times a day (QID) | ORAL | Status: DC | PRN
  Administered 2018-05-06 – 2018-05-07 (×2): 50 mg via ORAL
  Filled 2018-05-06 (×2): qty 1

## 2018-05-06 MED ORDER — ONDANSETRON 4 MG PO TBDP: 4.0000 mg | ORAL_TABLET | Freq: Four times a day (QID) | ORAL | Status: DC | PRN

## 2018-05-06 NOTE — Progress Notes (Signed)
[facility] MD Progress Note  05/06/2018 5:12 PM Michele Rojas  MRN:  [id number]   Subjective:    Anxiety" Pt wanting increased dose of vyvanse for binge eating d/o, reports less depression, denies SI. Discussed meds.  Principal Problem: PTSD (post-traumatic stress disorder) Diagnosis:   Patient Active Problem List   Diagnosis Date Noted  . PTSD (post-traumatic stress disorder) [F43.10] 05/04/2018  . Intentional benzodiazepine overdose ([place]) [T42.4X2A] 05/01/2018  . Severe recurrent major depression without psychotic features ([place]) [F33.2] 05/01/2018  . Suicide attempt ([place]) [T14.91XA] 05/01/2018  . GERD (gastroesophageal reflux disease) [K21.9] 02/13/2018  . Fatty liver [K76.0] 02/13/2018  . Lower abdominal pain [R10.30] 02/13/2018  . Weight gain, abnormal [R63.5] 07/28/2017  . Routine general medical examination at a health care facility [Z00.00] 05/25/2016  . Lipoma of lower extremity [D17.20] 03/08/2016  . Hyperlipidemia [E78.5] 02/22/2016  . Leukocytosis [D72.829] 02/20/2016  . Post partum depression [O99.345, F53.0] 01/08/2016  . Essential hypertension [I10] 01/07/2016  . Placental abruption [O45.90] 09/23/2015  . Encounter for biometric screening [Z01.89] 09/30/2013  . Screening for lipoid disorders [Z13.220] 09/28/2013  . Diabetes mellitus screening [Z13.1] 09/28/2013  . Hypothyroid [E03.9] 11/04/2011  . Depression [F32.9] 10/04/2011  . HEADACHE [R51] 11/18/2009  . Adjustment disorder with mixed anxiety and depressed mood [F43.23] 11/17/2009  . FATIGUE [R53.81, R53.83] 10/06/2009  . TOBACCO USE [F17.200] 02/19/2009  . CHICKENPOX, HX OF [Z91.89] 02/19/2009  . DEFICIENCY OF OTHER VITAMINS [E56.8] 04/29/2008  . OTHER CONSTIPATION [K59.09] 04/29/2008   Total Time spent with patient: 25 min Past Psychiatric History: See h&P  Past Medical History:  Past Medical History:  Diagnosis Date  . ADD (attention deficit disorder)    no meds  . Anxiety   . Constipation   .  Depression   . Fatigue   . Fatty liver   . Gallstones   . GERD (gastroesophageal reflux disease)   . History of chicken pox as a child  . History of preterm delivery    2015- 31wks, 2017- 35wks  . HLD (hyperlipidemia)   . HPV (human papilloma virus) infection   . HTN (hypertension)    No meds  . Hx of abnormal cervical Pap smear    ASCUS, LGSIL, CIN I, [place]  . Hypothyroidism    no meds  . Meckel's diverticulitis   . Mood swings   . Obesity   . Preeclampsia    2015 & 2017 pregnancies  . Right ovarian cyst    2.5cm  . Smoker   . Tobacco abuse   . Vitamin deficiency     Past Surgical History:  Procedure Laterality Date  . ACHILLES TENDON SURGERY Bilateral   . BONE MARROW BIOPSY  06/2017  . CESAREAN SECTION N/A 06/09/2014   Procedure: CESAREAN SECTION;  Surgeon: Farrel Gobble. Harrington Challenger, MD;  Location: [facility];  Service: Obstetrics;  Laterality: N/A;  . CESAREAN SECTION N/A 09/22/2015   Procedure: CESAREAN SECTION;  Surgeon: Jerelyn Charles, MD;  Location: [facility];  Service: Obstetrics;  Laterality: N/A;  . CHOLECYSTECTOMY    . CHOLECYSTECTOMY, LAPAROSCOPIC    . DIAGNOSTIC LAPAROSCOPY     removal of meckels diverticulum  . LAPAROSCOPIC SMALL BOWEL RESECTION N/A 11/02/2017   Procedure: LAPAROSCOPIC REMOVAL OF MECKEL'S DIVERTICULUM ERAS PATHWAY;  Surgeon: Excell Seltzer, MD;  Location: [facility];  Service: General;  Laterality: N/A;  . TUBAL LIGATION    . WISDOM TOOTH EXTRACTION     Family History:  Family History  Problem Relation Age of Onset  .  Hypertension Father   . Hyperlipidemia Father   . Prostate cancer Father   . Aneurysm Maternal Grandfather   . Depression Mother        after a car accident  . Colon polyps Mother   . Other Mother        ? CAD /heart disease  . Depression Brother        major depression  . Graves' disease Paternal Uncle   . Alzheimer's disease Paternal Grandmother   . Alzheimer's disease Paternal Grandfather   . Celiac disease Cousin        maternal  cousin   Family Psychiatric  History: See H&P Social History:  Social History   Substance and Sexual Activity  Alcohol Use No  . Alcohol/week: 0.0 standard drinks   Comment: rare     Social History   Substance and Sexual Activity  Drug Use No    Social History   Socioeconomic History  . Marital status: Married    Spouse name: Not on file  . Number of children: 2  . Years of education: Not on file  . Highest education level: Not on file  Occupational History  . Occupation: [employer]  Social Needs  . Financial resource strain: Not on file  . Food insecurity:    Worry: Not on file    Inability: Not on file  . Transportation needs:    Medical: Not on file    Non-medical: Not on file  Tobacco Use  . Smoking status: Current Every Day Smoker    Packs/day: 1.00    Types: Cigarettes  . Smokeless tobacco: Never Used  Substance and Sexual Activity  . Alcohol use: No    Alcohol/week: 0.0 standard drinks    Comment: rare  . Drug use: No  . Sexual activity: Yes  Lifestyle  . Physical activity:    Days per week: Not on file    Minutes per session: Not on file  . Stress: Not on file  Relationships  . Social connections:    Talks on phone: Not on file    Gets together: Not on file    Attends religious service: Not on file    Active member of club or organization: Not on file    Attends meetings of clubs or organizations: Not on file    Relationship status: Not on file  Other Topics Concern  . Not on file  Social History Narrative   GYN- Dr. 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 acetaminophen (TYLENOL) tablet 650 mg  650 mg Oral Q6H PRN Clapacs,  Madie Reno, MD   650 mg at 05/04/18 1400  . alum & mag hydroxide-simeth (MAALOX/MYLANTA) 200-200-20 MG/5ML suspension 30 mL  30 mL Oral Q4H PRN Clapacs, John T, MD      . buPROPion (WELLBUTRIN XL) 24 hr tablet 300 mg  300 mg Oral Daily McNew, Tyson Babinski, MD   300 mg at 05/06/18 0851  . cholecalciferol (VITAMIN D) tablet 1,000 Units  1,000 Units Oral Daily Clapacs, Madie Reno, MD  1,000 Units at 05/06/18 0851  . cloNIDine (CATAPRES) tablet 0.1 mg  0.1 mg Oral BID PRN McNew, Tyson Babinski, MD      . escitalopram (LEXAPRO) tablet 10 mg  10 mg Oral Daily McNew, Tyson Babinski, MD   10 mg at 05/06/18 0852  . famotidine (PEPCID) tablet 20 mg  20 mg Oral Daily Clapacs, Madie Reno, MD   20 mg at 05/06/18 0851  . hydrOXYzine (ATARAX/VISTARIL) tablet 50 mg  50 mg Oral Q6H PRN Lenward Chancellor, MD      . levothyroxine (SYNTHROID, LEVOTHROID) tablet 25 mcg  25 mcg Oral QAC breakfast Marylin Crosby, MD   25 mcg at 05/06/18 [phone]  . lisdexamfetamine (VYVANSE) capsule 30 mg  30 mg Oral Roxy Manns, Tyson Babinski, MD   30 mg at 05/06/18 [phone]  . magnesium hydroxide (MILK OF MAGNESIA) suspension 30 mL  30 mL Oral Daily PRN Clapacs, Madie Reno, MD   30 mL at 05/06/18 1243  . nicotine (NICODERM CQ - dosed in mg/24 hours) patch 21 mg  21 mg Transdermal Daily McNew, Tyson Babinski, MD   21 mg at 05/06/18 0855  . ondansetron (ZOFRAN-ODT) disintegrating tablet 4 mg  4 mg Oral Q6H PRN Lenward Chancellor, MD      . traZODone (DESYREL) tablet 100 mg  100 mg Oral QHS PRN Marylin Crosby, MD   100 mg at 05/05/18 2135    Lab Results:  No results found for this or any previous visit (from the past 48 hour(s)).  Blood Alcohol level:  Lab Results  Component Value Date   ETH <10 35/00/9381    Metabolic Disorder Labs: Lab Results  Component Value Date   HGBA1C 5.5 05/04/2018   MPG 111.15 05/04/2018   MPG 111 08/12/2016   Lab Results  Component Value Date   PROLACTIN 6.4 07/13/2016   Lab Results  Component Value Date   CHOL 211 (H) 05/04/2018   TRIG 376 (H)  05/04/2018   HDL 29 (L) 05/04/2018   CHOLHDL 7.3 05/04/2018   VLDL 75 (H) 05/04/2018   LDLCALC 107 (H) 05/04/2018    Physical Findings: AIMS:  , ,  ,  ,    CIWA:  CIWA-Ar Total: 0 COWS:  COWS Total Score: 1  Musculoskeletal: Strength & Muscle Tone: within normal limits Gait & Station: normal Patient leans: N/A  Psychiatric Specialty Exam: Physical Exam  Nursing note and vitals reviewed.   Review of Systems  All other systems reviewed and are negative.   Blood pressure 105/63, pulse 72, temperature 98 F (36.7 C), resp. rate 18, height _0  (1.676 m), weight 87.1 kg, last menstrual period 04/16/2018, SpO2 96 %.Body mass index is 30.99 kg/m.  General Appearance: Casual  Eye Contact:  Good  Speech:  Clear and Coherent  Volume:  Normal  Mood:  anxious  Affect:  Appropriate  Thought Process:  Coherent and Goal Directed  Orientation:  Full (Time, Place, and Person)  Thought Content:  Logical, denies AVH  Suicidal Thoughts:  No  Homicidal Thoughts:  No  Memory:  Immediate;   Fair  Judgement:  Fair  Insight:  Good  Psychomotor Activity:  Normal  Concentration:  Concentration: Fair  Recall:  [employer] of Knowledge:  Fair  Language:  Fair  Akathisia:  No      Assets:  Resilience  ADL's:  Intact  Cognition:  WNL  Sleep:  Number of Hours: 6.75     Treatment Plan Summary: 32 yo female  admitted after suicide attempt by overdose on benzos. Mood is improving. She is still anxious. Discussed meds.  Plan:  PTSD/Mood -Lexapro 10 mg daily for anxiety and PTSD -Continue Wellbutrin and increase back to 300 mg per her request as this has been very helpful for mood  ADHD -Continue lower dose of Vyvanse 30 mg. Monitor  BP  -Recommend not restarting Adderall since being on Vyvanse  Hypothyroid -Continue Synthroid 25 mcg    Dispo -discharge home when stable. She has APRN and therapist she sees regularly  Lenward Chancellor, MD 05/06/2018, 5:12 PMPatient ID: Michele Rojas, female   DOB: 1985-10-05, 32 y.o.   MRN: [id number]

## 2018-05-06 NOTE — [facility] Group Notes (Signed)
LCSW Group Therapy Note  05/06/2018 1:15pm  Type of Therapy and Topic:  Group Therapy:  Cognitive Distortions  Participation Level:  Active   Description of Group:    Patients in this group will be introduced to the topic of cognitive distortions.  Patients will identify and describe cognitive distortions, describe the feelings these distortions create for them.  Patients will identify one or more situations in their personal life where they have cognitively distorted thinking and will verbalize challenging this cognitive distortion through positive thinking skills.  Patients will practice the skill of using positive affirmations to challenge cognitive distortions using affirmation cards.    Therapeutic Goals:  1. Patient will identify two or more cognitive distortions they have used 2. Patient will identify one or more emotions that stem from use of a cognitive distortion 3. Patient will demonstrate use of a positive affirmation to counter a cognitive distortion through discussion and/or role play. 4. Patient will describe one way cognitive distortions can be detrimental to wellness   Summary of Patient Progress: The patient reported that she feels "tired, no suicidal ideations, nothing major." Patients were introduced to the topic of cognitive distortions. The patient was able to identify and describe cognitive distortions, described the feelings these distortions create for her.  The patient shared that her unrealistic thinking style is personalization. Patient identified a situation in her personal life where she has cognitively distorted thinking and was able to verbalize and challenged this cognitive distortion through positive thinking skills. Patient was able to provide support and validation to other group members.     Therapeutic Modalities:   Cognitive Behavioral Therapy Motivational Interviewing   Nevaen Tredway  CUEBAS-COLON, LCSW 05/06/2018 12:35 PM

## 2018-05-06 NOTE — Plan of Care (Addendum)
Patient is alert and oriented, up smiling and pleasant this morning. Patient denies SI, HI and AVH. Patient states," I feel more mentally stable now than before. Patient rates depression 0/10; anxiety 0/10; pain 0/10. Patient attending groups, cooperative with treatment and medications. Patient states, "I feel like I am ready to be discharged but I know that is up to the doctors." Patient received supplies for showering, last BM 05/04/18 patient received milk of mag given for mild constipation. No self injurious behavior noted. Problem: Education: Goal: Knowledge of [place] [facility] Education information/materials will improve Outcome: Progressing Goal: Emotional status will improve Outcome: Progressing Goal: Mental status will improve Outcome: Progressing Goal: Verbalization of understanding the information provided will improve Outcome: Progressing   Problem: Activity: Goal: Interest or engagement in activities will improve Outcome: Progressing Goal: Sleeping patterns will improve Outcome: Progressing   Problem: Safety: Goal: Periods of time without injury will increase Outcome: Progressing   Problem: Health Behavior/Discharge Planning: Goal: Ability to make decisions will improve Outcome: Progressing Goal: Compliance with therapeutic regimen will improve Outcome: Progressing   Problem: Safety: Goal: Ability to remain free from injury will improve Outcome: Progressing

## 2018-05-07 MED ORDER — HYDROXYZINE HCL 25 MG PO TABS
25.0000 mg | ORAL_TABLET | Freq: Four times a day (QID) | ORAL | Status: DC | PRN
  Administered 2018-05-07 – 2018-05-08 (×3): 25 mg via ORAL
  Filled 2018-05-07 (×3): qty 1

## 2018-05-07 NOTE — Plan of Care (Signed)
Patient verbalizes understanding of the general information that's been provided to you and has not voiced any further questions/concerns at this time. Patient denies SI/HI/AVH as well as any depression at this time. Patient rates her anxiety a "2/10" stating to this writer that she's anxious about "going home tomorrow". Patient has attended and participated in unit social work group today without any issues. Patient stated that she slept "so-so" last night, however, she has been present in the milieu throughout the day. Patient has been free from injury on the unit thus far and has the ability to make decisions regarding her care. Patient has been in compliance with her prescribed therapeutic regimen and has the ability to identify the changes that she needs to make in order to reduce recurrence of her condition. Patient has been free from any health-related complications. Patient remains safe on the unit at this time.   Problem: Education: Goal: Knowledge of [place] [facility] Education information/materials will improve Outcome: Progressing Goal: Emotional status will improve Outcome: Progressing Goal: Mental status will improve Outcome: Progressing Goal: Verbalization of understanding the information provided will improve Outcome: Progressing   Problem: Activity: Goal: Interest or engagement in activities will improve Outcome: Progressing Goal: Sleeping patterns will improve Outcome: Progressing   Problem: Safety: Goal: Periods of time without injury will increase Outcome: Progressing   Problem: Coping: Goal: Coping ability will improve Outcome: Progressing Goal: Will verbalize feelings Outcome: Progressing   Problem: Health Behavior/Discharge Planning: Goal: Ability to make decisions will improve Outcome: Progressing Goal: Compliance with therapeutic regimen will improve Outcome: Progressing   Problem: Health Behavior/Discharge Planning: Goal: Ability to identify changes in  lifestyle to reduce recurrence of condition will improve Outcome: Progressing Goal: Identification of resources available to assist in meeting health care needs will improve Outcome: Progressing   Problem: Physical Regulation: Goal: Complications related to the disease process, condition or treatment will be avoided or minimized Outcome: Progressing   Problem: Safety: Goal: Ability to remain free from injury will improve Outcome: Progressing

## 2018-05-07 NOTE — [facility] Group Notes (Signed)
LCSW Group Therapy Note 05/07/2018 1:15pm  Type of Therapy and Topic: Group Therapy: Feelings Around Returning Home & Establishing a Supportive Framework and Supporting Oneself When Supports Not Available  Participation Level: Active  Description of Group:  Patients first processed thoughts and feelings about upcoming discharge. These included fears of upcoming changes, lack of change, new living environments, judgements and expectations from others and overall stigma of mental health issues. The group then discussed the definition of a supportive framework, what that looks and feels like, and how do to discern it from an unhealthy non-supportive network. The group identified different types of supports as well as what to do when your family/friends are less than helpful or unavailable  Therapeutic Goals  1. Patient will identify one healthy supportive network that they can use at discharge. 2. Patient will identify one factor of a supportive framework and how to tell it from an unhealthy network. 3. Patient able to identify one coping skill to use when they do not have positive supports from others. 4. Patient will demonstrate ability to communicate their needs through discussion and/or role plays.  Summary of Patient Progress:  The patient scored her mood at an 8 (10 best.) Pt engaged during group session. As patients processed their anxiety about discharge and described healthy supports patient shared she is ready to be discharge. The patient listed her family as her main support.  Patients identified at least one self-care tool they were willing to use after discharge; writing in her journal, reading, and coloring.   Therapeutic Modalities Cognitive Behavioral Therapy Motivational Interviewing   Theressa Piedra  CUEBAS-COLON, LCSW 05/07/2018 10:41 AM

## 2018-05-07 NOTE — Progress Notes (Signed)
[facility] MD Progress Note  05/07/2018 12:43 PM Michele Rojas  MRN:  [id number]   Subjective:    Tired" Pt had prn trazodone and prn vistaril 45m last eve,   reports less depression, but on and off anxiety, denies SI.  Principal Problem: PTSD (post-traumatic stress disorder) Diagnosis:   Patient Active Problem List   Diagnosis Date Noted  . PTSD (post-traumatic stress disorder) [F43.10] 05/04/2018  . Intentional benzodiazepine overdose ([place] [T42.4X2A] 05/01/2018  . Severe recurrent major depression without psychotic features ([place] [F33.2] 05/01/2018  . Suicide attempt ([place] [T14.91XA] 05/01/2018  . GERD (gastroesophageal reflux disease) [K21.9] 02/13/2018  . Fatty liver [K76.0] 02/13/2018  . Lower abdominal pain [R10.30] 02/13/2018  . Weight gain, abnormal [R63.5] 07/28/2017  . Routine general medical examination at a health care facility [Z00.00] 05/25/2016  . Lipoma of lower extremity [D17.20] 03/08/2016  . Hyperlipidemia [E78.5] 02/22/2016  . Leukocytosis [D72.829] 02/20/2016  . Post partum depression [O99.345, F53.0] 01/08/2016  . Essential hypertension [I10] 01/07/2016  . Placental abruption [O45.90] 09/23/2015  . Encounter for biometric screening [Z01.89] 09/30/2013  . Screening for lipoid disorders [Z13.220] 09/28/2013  . Diabetes mellitus screening [Z13.1] 09/28/2013  . Hypothyroid [E03.9] 11/04/2011  . Depression [F32.9] 10/04/2011  . HEADACHE [R51] 11/18/2009  . Adjustment disorder with mixed anxiety and depressed mood [F43.23] 11/17/2009  . FATIGUE [R53.81, R53.83] 10/06/2009  . TOBACCO USE [F17.200] 02/19/2009  . CHICKENPOX, HX OF [Z91.89] 02/19/2009  . DEFICIENCY OF OTHER VITAMINS [E56.8] 04/29/2008  . OTHER CONSTIPATION [K59.09] 04/29/2008   Total Time spent with patient: 25 min Past Psychiatric History: See h&P  Past Medical History:  Past Medical History:  Diagnosis Date  . ADD (attention deficit disorder)    no meds  . Anxiety   . Constipation   .  Depression   . Fatigue   . Fatty liver   . Gallstones   . GERD (gastroesophageal reflux disease)   . History of chicken pox as a child  . History of preterm delivery    2015- 31wks, 2017- 35wks  . HLD (hyperlipidemia)   . HPV (human papilloma virus) infection   . HTN (hypertension)    No meds  . Hx of abnormal cervical Pap smear    ASCUS, LGSIL, CIN I, [place]  . Hypothyroidism    no meds  . Meckel's diverticulitis   . Mood swings   . Obesity   . Preeclampsia    2015 & 2017 pregnancies  . Right ovarian cyst    2.5cm  . Smoker   . Tobacco abuse   . Vitamin deficiency     Past Surgical History:  Procedure Laterality Date  . ACHILLES TENDON SURGERY Bilateral   . BONE MARROW BIOPSY  06/2017  . CESAREAN SECTION N/A 06/09/2014   Procedure: CESAREAN SECTION;  Surgeon: KFarrel Gobble RHarrington Challenger MD;  [facility];  Service: Obstetrics;  Laterality: N/A;  . CESAREAN SECTION N/A 09/22/2015   Procedure: CESAREAN SECTION;  Surgeon: DJerelyn Charles MD;  [facility];  Service: Obstetrics;  Laterality: N/A;  . CHOLECYSTECTOMY    . CHOLECYSTECTOMY, LAPAROSCOPIC    . DIAGNOSTIC LAPAROSCOPY     removal of meckels diverticulum  . LAPAROSCOPIC SMALL BOWEL RESECTION N/A 11/02/2017   Procedure: LAPAROSCOPIC REMOVAL OF MECKEL'S DIVERTICULUM ERAS PATHWAY;  Surgeon: HExcell Seltzer MD;  Location[facility];  Service: General;  Laterality: N/A;  . TUBAL LIGATION    . WISDOM TOOTH EXTRACTION     Family History:  Family History  Problem Relation  Age of Onset  . Hypertension Father   . Hyperlipidemia Father   . Prostate cancer Father   . Aneurysm Maternal Grandfather   . Depression Mother        after a car accident  . Colon polyps Mother   . Other Mother        ? CAD /heart disease  . Depression Brother        major depression  . Graves' disease Paternal Uncle   . Alzheimer's disease Paternal Grandmother   . Alzheimer's disease Paternal Grandfather   . Celiac disease Cousin        maternal  cousin   Family Psychiatric  History: See H&P Social History:  Social History   Substance and Sexual Activity  Alcohol Use No  . Alcohol/week: 0.0 standard drinks   Comment: rare     Social History   Substance and Sexual Activity  Drug Use No    Social History   Socioeconomic History  . Marital status: Married    Spouse name: Not on file  . Number of children: 2  . Years of education: Not on file  . Highest education level: Not on file  Occupational History  . Occupation: [employer]  Social Needs  . Financial resource strain: Not on file  . Food insecurity:    Worry: Not on file    Inability: Not on file  . Transportation needs:    Medical: Not on file    Non-medical: Not on file  Tobacco Use  . Smoking status: Current Every Day Smoker    Packs/day: 1.00    Types: Cigarettes  . Smokeless tobacco: Never Used  Substance and Sexual Activity  . Alcohol use: No    Alcohol/week: 0.0 standard drinks    Comment: rare  . Drug use: No  . Sexual activity: Yes  Lifestyle  . Physical activity:    Days per week: Not on file    Minutes per session: Not on file  . Stress: Not on file  Relationships  . Social connections:    Talks on phone: Not on file    Gets together: Not on file    Attends religious service: Not on file    Active member of club or organization: Not on file    Attends meetings of clubs or organizations: Not on file    Relationship status: Not on file  Other Topics Concern  . Not on file  Social History Narrative   GYN- Dr. 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 acetaminophen (TYLENOL) tablet 650 mg  650 mg Oral Q6H PRN Clapacs,  Madie Reno, MD   650 mg at 05/04/18 1400  . alum & mag hydroxide-simeth (MAALOX/MYLANTA) 200-200-20 MG/5ML suspension 30 mL  30 mL Oral Q4H PRN Clapacs, John T, MD      . buPROPion (WELLBUTRIN XL) 24 hr tablet 300 mg  300 mg Oral Daily McNew, Tyson Babinski, MD   300 mg at 05/07/18 0853  . cholecalciferol (VITAMIN D) tablet 1,000 Units  1,000 Units Oral Daily  Clapacs, Madie Reno, MD   1,000 Units at 05/07/18 [phone]  . cloNIDine (CATAPRES) tablet 0.1 mg  0.1 mg Oral BID PRN McNew, Tyson Babinski, MD      . escitalopram (LEXAPRO) tablet 10 mg  10 mg Oral Daily McNew, Tyson Babinski, MD   10 mg at 05/07/18 0853  . famotidine (PEPCID) tablet 20 mg  20 mg Oral Daily Clapacs, Madie Reno, MD   20 mg at 05/07/18 0651  . hydrOXYzine (ATARAX/VISTARIL) tablet 25 mg  25 mg Oral Q6H PRN Lenward Chancellor, MD      . levothyroxine (SYNTHROID, LEVOTHROID) tablet 25 mcg  25 mcg Oral QAC breakfast Marylin Crosby, MD   25 mcg at 05/07/18 [phone]  . lisdexamfetamine (VYVANSE) capsule 30 mg  30 mg Oral BH-q7a McNew, Tyson Babinski, MD   30 mg at 05/07/18 0650  . magnesium hydroxide (MILK OF MAGNESIA) suspension 30 mL  30 mL Oral Daily PRN Clapacs, Madie Reno, MD   30 mL at 05/06/18 1243  . nicotine (NICODERM CQ - dosed in mg/24 hours) patch 21 mg  21 mg Transdermal Daily McNew, Tyson Babinski, MD   21 mg at 05/07/18 0855  . ondansetron (ZOFRAN-ODT) disintegrating tablet 4 mg  4 mg Oral Q6H PRN Lenward Chancellor, MD      . traZODone (DESYREL) tablet 100 mg  100 mg Oral QHS PRN Marylin Crosby, MD   100 mg at 05/06/18 2128    Lab Results:  No results found for this or any previous visit (from the past 48 hour(s)).  Blood Alcohol level:  Lab Results  Component Value Date   ETH <10 49/44/9675    Metabolic Disorder Labs: Lab Results  Component Value Date   HGBA1C 5.5 05/04/2018   MPG 111.15 05/04/2018   MPG 111 08/12/2016   Lab Results  Component Value Date   PROLACTIN 6.4 07/13/2016   Lab Results  Component Value Date   CHOL 211 (H) 05/04/2018   TRIG 376 (H)  05/04/2018   HDL 29 (L) 05/04/2018   CHOLHDL 7.3 05/04/2018   VLDL 75 (H) 05/04/2018   LDLCALC 107 (H) 05/04/2018    Physical Findings: AIMS:  , ,  ,  ,    CIWA:  CIWA-Ar Total: 0 COWS:  COWS Total Score: 1  Musculoskeletal: Strength & Muscle Tone: within normal limits Gait & Station: normal Patient leans: N/A  Psychiatric Specialty Exam: Physical Exam  Nursing note and vitals reviewed.   Review of Systems  All other systems reviewed and are negative.   Blood pressure 111/77, pulse 95, temperature 97.6 F (36.4 C), temperature source Oral, resp. rate 16, height '5\' 6"'  (1.676 m), weight 87.1 kg, last menstrual period 04/16/2018, SpO2 98 %.Body mass index is 30.99 kg/m.  General Appearance: Casual  Eye Contact:  Good  Speech:  Clear and Coherent  Volume:  Normal  Mood:  tired  Affect:  Appropriate  Thought Process:  Coherent and Goal Directed  Orientation:  Full (Time, Place, and Person)  Thought Content:  Goal directed, denies AVH  Suicidal Thoughts:  No  Homicidal Thoughts:  No  Memory:  Immediate;   Fair  Judgement:  Fair  Insight:  Good  Psychomotor Activity:  Normal  Concentration:  Concentration: Fair  Recall:  [employer] of Knowledge:  Fair  Language:  Fair  Akathisia:  No      Assets:  Resilience  ADL's:  Intact  Cognition:  WNL  Sleep:  Number of Hours: 7.25  Treatment Plan Summary: 32 yo female admitted after suicide attempt by overdose on benzos. Mood is improving. Decrease prn vistaril dose due to reported tiredness.  Plan:  PTSD/Mood -Lexapro 10 mg daily for anxiety and PTSD -Continue Wellbutrin and increase back to 300 mg per her request as this has been very helpful for mood  ADHD -Continue lower dose of Vyvanse 30 mg. Monitor  BP  -Recommend not restarting Adderall since being on Vyvanse  Hypothyroid -Continue Synthroid 25 mcg    Dispo -discharge home when stable. She has APRN and therapist she sees regularly  Lenward Chancellor, MD 05/07/2018, 12:43 PMPatient ID: Michele Rojas, female   DOB: 09/23/1985, 32 y.o.   MRN: [id number] Patient ID: Michele Rojas, female   DOB: 10-13-1985, 32 y.o.   MRN: [id number]

## 2018-05-07 NOTE — Progress Notes (Signed)
Patient has been cooperative with treatment, she spent most of the evening in the dayroom with peers, and she interacted well with peers and staff. She was medication compliant no inappropriate behaviors to report on shift at this time. In bed resting at this time.

## 2018-05-07 NOTE — Progress Notes (Signed)
D- Patient alert and oriented. Patient presents in a pleasant mood on assessment stating that she slept "on and off" last night, but did not have any complaints to voice to this Probation officer. Patient reported on her self-inventory a depression and anxiety level a "2/10", however, she denied depression to this Probation officer, but stated that she is anxious to go home. Patient denies SI, HI, AVH, and pain at this time. Patient's goal for today is to "taking time for myself", in which she will "get rest, read, color" in order to accomplish her.  A- Scheduled medications administered to patient, per MD orders. Support and encouragement provided.  Routine safety checks conducted every 15 minutes.  Patient informed to notify staff with problems or concerns.  R- No adverse drug reactions noted. Patient contracts for safety at this time. Patient compliant with medications and treatment plan. Patient receptive, calm, and cooperative. Patient interacts well with others on the unit.  Patient remains safe at this time.

## 2018-05-08 MED ORDER — TRAZODONE HCL 50 MG PO TABS: 50.0000 mg | ORAL_TABLET | Freq: Every evening | ORAL | 0 refills | Status: DC | PRN

## 2018-05-08 MED ORDER — ESCITALOPRAM OXALATE 10 MG PO TABS: 10.0000 mg | ORAL_TABLET | Freq: Every day | ORAL | 0 refills | Status: DC

## 2018-05-08 MED ORDER — BUPROPION HCL ER (XL) 300 MG PO TB24: 300.0000 mg | ORAL_TABLET | Freq: Every day | ORAL | 0 refills | Status: AC

## 2018-05-08 MED ORDER — HYDROXYZINE HCL 50 MG PO TABS: 50.0000 mg | ORAL_TABLET | Freq: Four times a day (QID) | ORAL | 0 refills | Status: DC | PRN

## 2018-05-08 MED ORDER — LEVOTHYROXINE SODIUM 25 MCG PO TABS: 25.0000 ug | ORAL_TABLET | Freq: Every day | ORAL | 0 refills | Status: DC

## 2018-05-08 NOTE — B[facility] Suicide Risk Assessment (Signed)
[facility] Discharge Suicide Risk Assessment   Principal Problem: PTSD (post-traumatic stress disorder) Discharge Diagnoses:   Mental Status Per Nursing Assessment::   On Admission:  NA  Demographic Factors:  Caucasian  Loss Factors: NA  Historical Factors: Impulsivity  Risk Reduction Factors:   Responsible for children under 31 years of age, Sense of responsibility to family, Living with another person, especially a relative, Positive social support, Positive therapeutic relationship and Positive coping skills or problem solving skills  Continued Clinical Symptoms:  Previous Psychiatric Diagnoses and Treatments  Cognitive Features That Contribute To Risk:  None    Suicide Risk:  Minimal Acute Risk: No identifiable suicidal ideation.      Plan Of Care/Follow-up recommendations:  [place], MD 05/08/2018, 9:05 AM

## 2018-05-08 NOTE — Progress Notes (Signed)
Recreation Therapy Notes  Date: 05/08/2018  Time: 9:30 am  Location: Craft Room  Behavioral response: Appropriate   Intervention Topic: Team Work  Discussion/Intervention:  Group content on today was focused on teamwork. The group identified what teamwork is. Individuals described who is a part of their team. Patients expressed why they thought teamwork is important. The group stated reasons why they thought it was easier to work with a Dance movement psychotherapist team. Individuals discussed some positives and negatives of working with a team. Patients gave examples of past experiences they had while working with a team. The group participated in the intervention "Story in a bag", patients were in groups and were able to test their skill in a team setting.  Clinical Observations/Feedback:  Patient attended group and stated an important part of team work is compromise. Individual was social with peers and staff while participating in the intervention.  Jovanny Stephanie LRT/CTRS         Shonta Phillis 05/08/2018 12:25 PM
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
## 2018-05-08 NOTE — Progress Notes (Signed)
  [facility] Adult Case Management Discharge Plan :  Will you be returning to the same living situation after discharge:  Yes,    At discharge, do you have transportation home?: Yes,    Do you have the ability to pay for your medications: Yes,     Release of information consent forms completed and in the chart;  Patient's signature needed at discharge.  Patient to Follow up at: Follow-up [place], [facility]. Go on 05/16/2018.   Specialty:  Behavioral Health Why:  10:00am for Hospital Follow up and continued medication management Contact information: [address] [place] [address] [phone]           Next level of care provider has access to [place] and Suicide Prevention discussed: Yes,     Have you used any form of tobacco in the last 30 days? (Cigarettes, Smokeless Tobacco, Cigars, and/or Pipes): Yes  Has patient been referred to the Quitline?: Patient refused referral  Patient has been referred for addiction treatment: Yes  August Saucer, LCSW 05/08/2018, 5:10 PM

## 2018-05-08 NOTE — Progress Notes (Signed)
Patient discharged home. DC instructions provided and explained. Medications reviewed. Rx given. All questions answered. Denies SI, HI, AVH. Belongings returned. Rx given. Discharge instructions, transition record and Suicidal risk assessment provided and explained. Pt stable at discharge.

## 2018-05-08 NOTE — Progress Notes (Signed)
Patient alert and oriented x 4, denies SI/HI/AVH, thoughts are organized and coherent affect is bright, noted interacting appropriately with peers, visible in the milieu no distress noted. Patient rated anxiety a 5/10 and was medicated per standing order. Support and encouragement given to patient, 15 minutes safety checks maintained will continue to monitor.

## 2018-05-08 NOTE — Plan of Care (Signed)
  Problem: Coping: Goal: Coping ability will improve Outcome: Progressing  Patient appears  less anxious.

## 2018-05-08 NOTE — [facility] Group Notes (Signed)
[facility] Group Notes:  (Nursing/MHT/Case Management/Adjunct)  Date:  05/08/2018  Time:  9:43 AM  Type of Therapy:  Psychoeducational Skills  Participation Level:  Active  Participation Quality:  Appropriate, Attentive and Supportive  Affect:  Appropriate and Excited  Cognitive:  Alert, Appropriate and Oriented  Insight:  Good  Engagement in Group:  Engaged and Supportive  Modes of Intervention:  Discussion, Education and Exploration  Summary of Progress/Problems:  Kathi Ludwig 05/08/2018, 9:43 AM

## 2018-05-08 NOTE — Progress Notes (Signed)
Recreation Therapy Notes  INPATIENT RECREATION TR PLAN  Patient Details Name: Michele TALIERCIO MRN: [id number] DOB: 1985/11/01 Today's Date: 05/08/2018  Rec Therapy Plan Is patient appropriate for Therapeutic Recreation?: Yes Treatment times per week: at least 3 Estimated Length of Stay: 5-7 days TR Treatment/Interventions: Group participation (Comment)  Discharge Criteria Pt will be discharged from therapy if:: Discharged Treatment plan/goals/alternatives discussed and agreed upon by:: Patient/family  Discharge Summary Short term goals set: N/A Short term goals met: Complete Progress toward goals comments: Groups attended Which groups?: Other (Comment), Stress management, Communication(Team work) Reason goals not met: N/A Therapeutic equipment acquired: N/A Reason patient discharged from therapy: Discharge from hospital Pt/family agrees with progress & goals achieved: Yes Date patient discharged from therapy: 05/08/18   Rache Klimaszewski 05/08/2018, 2:31 PM

## 2018-05-12 ENCOUNTER — Ambulatory Visit: Payer: Managed Care, Other (non HMO) | Admitting: Family Medicine

## 2018-05-12 ENCOUNTER — Encounter: Payer: Self-pay | Admitting: Family Medicine

## 2018-05-12 VITALS — BP 138/80 | HR 105 | Temp 98.0°F | Ht 67.0 in | Wt 191.2 lb

## 2018-05-12 DIAGNOSIS — Z23 Encounter for immunization: Secondary | ICD-10-CM

## 2018-05-12 DIAGNOSIS — T1491XA Suicide attempt, initial encounter: Secondary | ICD-10-CM

## 2018-05-12 DIAGNOSIS — F4323 Adjustment disorder with mixed anxiety and depressed mood: Secondary | ICD-10-CM

## 2018-05-12 DIAGNOSIS — F17200 Nicotine dependence, unspecified, uncomplicated: Secondary | ICD-10-CM

## 2018-05-12 DIAGNOSIS — I10 Essential (primary) hypertension: Secondary | ICD-10-CM | POA: Diagnosis not present

## 2018-05-12 DIAGNOSIS — T424X2S Poisoning by benzodiazepines, intentional self-harm, sequela: Secondary | ICD-10-CM

## 2018-05-12 DIAGNOSIS — R740 Nonspecific elevation of levels of transaminase and lactic acid dehydrogenase [LDH]: Secondary | ICD-10-CM

## 2018-05-12 DIAGNOSIS — E039 Hypothyroidism, unspecified: Secondary | ICD-10-CM | POA: Diagnosis not present

## 2018-05-12 DIAGNOSIS — R7401 Elevation of levels of liver transaminase levels: Secondary | ICD-10-CM

## 2018-05-12 NOTE — Progress Notes (Signed)
Subjective:    Patient ID: Michele Rojas, female    DOB: 04/23/1986, 32 y.o.   MRN: [id number]  HPI Here for hospital f/u  Admitted on 9/22 Was hospitalized for intentional overdose of klonopin and xanax /for severe anxiety and depression  She did develop acute encephalopathy due to klonopin overdose temporarily   Was seeing  provider at [facility] Had recently inc wellbutrin Was on vyvanse to plan on weaning adderall   Once medically stable she went to inpt psych admission  diag with PTSD Participated in groups D/c when low risk of suicide and motivated to return  Medications were adjusted   Wt Readings from Last 3 Encounters:  05/12/18 191 lb 4 oz (86.8 kg)  05/03/18 192 lb (87.1 kg)  05/01/18 189 lb 14.4 oz (86.1 kg)   29.95 kg/m   Smoking - is the same/no change  (she did get nicotine patches at [facility])- has a quit date at end of the year   BP Readings from Last 3 Encounters:  05/12/18 (!) 148/84  05/08/18 112/78  05/03/18 120/78  better on 2nd check BP: 138/80   Pulse Readings from Last 3 Encounters:  05/12/18 (!) 105  05/08/18 84  05/03/18 73   Lab Results  Component Value Date   CREATININE 0.74 05/02/2018   BUN 9 05/02/2018   NA 139 05/02/2018   K 3.6 05/02/2018   CL 105 05/02/2018   CO2 26 05/02/2018   Lab Results  Component Value Date   ALT 55 (H) 04/30/2018   AST 37 04/30/2018   ALKPHOS 110 04/30/2018   BILITOT 0.7 04/30/2018    Lab Results  Component Value Date   TSH 6.542 (H) 05/04/2018   free T4 0.7 feelt ok levothyroxine 25 mcg   Mildly high ALT  No alcohol at all now  (she had drank more right before her admission)    Today feels better In retrospect glad that they put her in beh health   Now-psychiatry ([facility]) - Marguarite Arbour , and Tobin Chad  She had appt with Lattie Haw - on 8/8   wellbutrin lexapro 10 mg  Trazodone - has px for sleep-she has not taken it (afraid to take with kids)  Sleep - is ok (going to bed later) -  just needs to go to bed earlier  No side effects  Appetite is fair   vyvanse for ADD- that is ok / thinks she may need to inc the dose    Husband and family are helpful  Coping tech - writing in journal - likes doing it /hard to find time  Likes to read  Some walking with the kids (was going to gym)- plans to go back   Patient Active Problem List   Diagnosis Date Noted  . PTSD (post-traumatic stress disorder) 05/04/2018  . Intentional benzodiazepine overdose ([place]) 05/01/2018  . Severe recurrent major depression without psychotic features ([place]) 05/01/2018  . Suicide attempt ([place]) 05/01/2018  . GERD (gastroesophageal reflux disease) 02/13/2018  . Fatty liver 02/13/2018  . Lower abdominal pain 02/13/2018  . Weight gain, abnormal 07/28/2017  . Routine general medical examination at a health care facility 05/25/2016  . Lipoma of lower extremity 03/08/2016  . Hyperlipidemia 02/22/2016  . Leukocytosis 02/20/2016  . 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 Essential hypertension 01/07/2016  . Placental abruption 09/23/2015  . Encounter for biometric screening 09/30/2013  . Screening for lipoid disorders 09/28/2013  . Diabetes mellitus screening 09/28/2013  . Hypothyroid 11/04/2011  .  Depression 10/04/2011  . HEADACHE 11/18/2009  . Adjustment disorder with mixed anxiety and depressed mood 11/17/2009  . FATIGUE 10/06/2009  . TOBACCO USE 02/19/2009  . CHICKENPOX, HX OF 02/19/2009  . DEFICIENCY OF OTHER VITAMINS 04/29/2008  . OTHER CONSTIPATION 04/29/2008   Past Medical History:  Diagnosis Date  . ADD (attention deficit disorder)    no meds  . Anxiety   . Constipation   . Depression   . Fatigue   . Fatty liver   . Gallstones   . GERD (gastroesophageal reflux disease)   . History of chicken pox as a child  . History of preterm delivery    2015- 31wks, 2017- 35wks  . HLD (hyperlipidemia)   . HPV (human papilloma virus) infection   . HTN (hypertension)    No meds  . Hx of  abnormal cervical Pap smear    ASCUS, LGSIL, CIN I, Colpo  . Hypothyroidism    no meds  . Meckel's diverticulitis   . Mood swings   . Obesity   . Preeclampsia    2015 & 2017 pregnancies  . Right ovarian cyst    2.5cm  . Smoker   . Tobacco abuse   . Vitamin deficiency    Past Surgical History:  Procedure Laterality Date  . ACHILLES TENDON SURGERY Bilateral   . BONE MARROW BIOPSY  06/2017  . CESAREAN SECTION N/A 06/09/2014   Procedure: CESAREAN SECTION;  Surgeon: Farrel Gobble. Harrington Challenger, MD;  Location: [facility];  Service: Obstetrics;  Laterality: N/A;  . CESAREAN SECTION N/A 09/22/2015   Procedure: CESAREAN SECTION;  Surgeon: Jerelyn Charles, MD;  Location: [facility];  Service: Obstetrics;  Laterality: N/A;  . CHOLECYSTECTOMY    . CHOLECYSTECTOMY, LAPAROSCOPIC    . DIAGNOSTIC LAPAROSCOPY     removal of meckels diverticulum  . LAPAROSCOPIC SMALL BOWEL RESECTION N/A 11/02/2017   Procedure: LAPAROSCOPIC REMOVAL OF MECKEL'S DIVERTICULUM ERAS PATHWAY;  Surgeon: Excell Seltzer, MD;  Location: [facility];  Service: General;  Laterality: N/A;  . TUBAL LIGATION    . WISDOM TOOTH EXTRACTION     Social History   Tobacco Use  . Smoking status: Current Every Day Smoker    Packs/day: 1.00    Types: Cigarettes  . Smokeless tobacco: Never Used  Substance Use Topics  . Alcohol use: No    Alcohol/week: 0.0 standard drinks    Comment: rare  . Drug use: No   Family History  Problem Relation Age of Onset  . Hypertension Father   . Hyperlipidemia Father   . Prostate cancer Father   . Aneurysm Maternal Grandfather   . Depression Mother        after a car accident  . Colon polyps Mother   . Other Mother        ? CAD /heart disease  . Depression Brother        major depression  . Graves' disease Paternal Uncle   . Alzheimer's disease Paternal Grandmother   . Alzheimer's disease Paternal Grandfather   . Celiac disease Cousin        maternal cousin   Allergies  Allergen Reactions  . Amoxicillin  Other (See Comments)    REACTION: yeast infection in mouth Has patient had a PCN reaction causing immediate rash, facial/tongue/throat swelling, SOB or lightheadedness with hypotension: No Has patient had a PCN reaction causing severe rash involving mucus membranes or skin necrosis: No Has patient had a PCN reaction that required hospitalization no Has patient had a PCN reaction  occurring within the last 10 years: Yes If all of the above answers are "NO", then may proceed with Cephalosporin use.   Current Outpatient Medications on File Prior to Visit  Medication Sig Dispense Refill  . buPROPion (WELLBUTRIN XL) 300 MG 24 hr tablet Take 1 tablet (300 mg total) by mouth daily. 30 tablet 0  . cholecalciferol (VITAMIN D) 1000 units tablet Take 1,000 Units by mouth daily.    Marland Kitchen escitalopram (LEXAPRO) 10 MG tablet Take 1 tablet (10 mg total) by mouth daily. 30 tablet 0  . hydrOXYzine (ATARAX/VISTARIL) 50 MG tablet Take 1 tablet (50 mg total) by mouth every 6 (six) hours as needed for anxiety. 60 tablet 0  . levothyroxine (SYNTHROID, LEVOTHROID) 25 MCG tablet Take 1 tablet (25 mcg total) by mouth daily before breakfast. 30 tablet 0  . ranitidine (ZANTAC) 150 MG capsule Take 150 mg by mouth daily.    . traZODone (DESYREL) 50 MG tablet Take 1 tablet (50 mg total) by mouth at bedtime as needed for sleep. 30 tablet 0  . VYVANSE 50 MG capsule Take 50 mg by mouth daily.  0   No current facility-administered medications on file prior to visit.      Review of Systems  Constitutional: Positive for fatigue. Negative for activity change, appetite change, fever and unexpected weight change.  HENT: Negative for congestion, ear pain, rhinorrhea, sinus pressure and sore throat.   Eyes: Negative for pain, redness and visual disturbance.  Respiratory: Negative for cough, shortness of breath and wheezing.   Cardiovascular: Negative for chest pain and palpitations.  Gastrointestinal: Negative for abdominal pain,  blood in stool, constipation and diarrhea.  Endocrine: Negative for polydipsia and polyuria.  Genitourinary: Negative for dysuria, frequency and urgency.  Musculoskeletal: Negative for arthralgias, back pain and myalgias.  Skin: Negative for pallor and rash.  Allergic/Immunologic: Negative for environmental allergies.  Neurological: Negative for dizziness, syncope and headaches.  Hematological: Negative for adenopathy. Does not bruise/bleed easily.  Psychiatric/Behavioral: Positive for dysphoric mood. Negative for decreased concentration, self-injury, sleep disturbance and suicidal ideas. The patient is nervous/anxious.        Improved depression and anxiety symptoms       Objective:   Physical Exam  Constitutional: She is oriented to person, place, and time. She appears well-developed and well-nourished. No distress.  overwt and well app   HENT:  Head: Normocephalic and atraumatic.  Mouth/Throat: Oropharynx is clear and moist.  Eyes: Pupils are equal, round, and reactive to light. Conjunctivae and EOM are normal. Right eye exhibits no discharge. Left eye exhibits no discharge. No scleral icterus.  Neck: Normal range of motion. Neck supple. No thyromegaly present.  Cardiovascular: Regular rhythm and normal heart sounds.  No murmur heard. Mild tachycardia  Pulmonary/Chest: Effort normal and breath sounds normal. No stridor. No respiratory distress.  Abdominal: Soft. Bowel sounds are normal. She exhibits no distension and no mass. There is no tenderness.  Musculoskeletal: Normal range of motion. She exhibits no edema.  Lymphadenopathy:    She has no cervical adenopathy.  Neurological: She is alert and oriented to person, place, and time. She displays no tremor and normal reflexes. No cranial nerve deficit. She exhibits normal muscle tone. Coordination normal.  Skin: Skin is warm and dry. No rash noted.  Psychiatric: Her speech is normal and behavior is normal. Thought content normal.  Her mood appears anxious. Her affect is blunt. She is not actively hallucinating. Thought content is not paranoid. Cognition and memory are normal. She  expresses no homicidal and no suicidal ideation.  Somewhat blunted affect but overall pleasant Admits to feeling mildly anxious Candid about symptoms /stressors and recent hospitalization She is attentive.          Assessment & Plan:

## 2018-05-12 NOTE — Patient Instructions (Addendum)
Follow up at [facility] as planned  Get set up with a new therapist when ready   Keep thinking about quitting smoking  minimize caffeine   Blood pressure is improved on 2nd check   Labs in 1 month for thyroid and liver   Follow up in 3 months   Flu shot today

## 2018-05-13 NOTE — Assessment & Plan Note (Signed)
With overdose of klonopin and xanax Medical and then beh health admission Doing better currently  Psychiatric f/u planned  No longer has SI

## 2018-05-13 NOTE — Assessment & Plan Note (Signed)
bp in fair control at this time  BP Readings from Last 1 Encounters:  05/12/18 138/80   No changes needed Most recent labs reviewed  Disc lifstyle change with low sodium diet and exercise

## 2018-05-13 NOTE — Assessment & Plan Note (Signed)
Newly on 25 mcg of levothyroxine Will re check [place] in a mo  Reviewed hospital records, lab results and studies in detail

## 2018-05-13 NOTE — Assessment & Plan Note (Signed)
ALT mildly up (55) in the hospital after benzo overdose Disc avoidance of etoh and acetaminophen (she was drinking etoh at time of admission) Re check 1 mo

## 2018-05-13 NOTE — Assessment & Plan Note (Signed)
Reviewed hospital records, lab results and studies in detail  Has psychiatric f/u  Doing much better

## 2018-05-13 NOTE — Assessment & Plan Note (Addendum)
Disc in detail risks of smoking and possible outcomes including copd, vascular/ heart disease, cancer , respiratory and sinus infections  Pt voices understanding  Considering cessaion- did buy nicotine patches

## 2018-05-13 NOTE — Assessment & Plan Note (Signed)
Recent hosp for overdose (? SI-pt denies) of xanax and klonopin with severe anxiety and depression  Then also behavioral health admission Reviewed hospital records, lab results and studies in detail   Was started on low dose levothyroxine for hypothyroidism  Planning psychiatry and counseling f/u  Continue wellbutrin/ lexapro and trazodone prn  Overall doing better  Reviewed stressors/ coping techniques/symptoms/ support sources/ tx options and side effects in detail today  Good support with family  No SI currently

## 2018-06-15 ENCOUNTER — Other Ambulatory Visit: Payer: Self-pay

## 2018-06-15 ENCOUNTER — Emergency Department (HOSPITAL_COMMUNITY)
Admission: EM | Admit: 2018-06-15 | Discharge: 2018-06-15 | Disposition: A | Discharge status: Home / Self Care | Payer: Managed Care, Other (non HMO) | Attending: Emergency Medicine | Admitting: Emergency Medicine

## 2018-06-15 ENCOUNTER — Ambulatory Visit (HOSPITAL_COMMUNITY)
Admission: RE | Admit: 2018-06-15 | Discharge: 2018-06-15 | Disposition: A | Discharge status: Home / Self Care | Payer: Managed Care, Other (non HMO) | Source: Home / Self Care | Attending: Psychiatry | Admitting: Psychiatry

## 2018-06-15 ENCOUNTER — Encounter (HOSPITAL_COMMUNITY): Payer: Self-pay | Admitting: *Deleted

## 2018-06-15 DIAGNOSIS — I10 Essential (primary) hypertension: Secondary | ICD-10-CM | POA: Insufficient documentation

## 2018-06-15 DIAGNOSIS — E039 Hypothyroidism, unspecified: Secondary | ICD-10-CM | POA: Insufficient documentation

## 2018-06-15 DIAGNOSIS — F17210 Nicotine dependence, cigarettes, uncomplicated: Secondary | ICD-10-CM | POA: Diagnosis not present

## 2018-06-15 DIAGNOSIS — Z79899 Other long term (current) drug therapy: Secondary | ICD-10-CM | POA: Insufficient documentation

## 2018-06-15 DIAGNOSIS — F329 Major depressive disorder, single episode, unspecified: Secondary | ICD-10-CM | POA: Diagnosis present

## 2018-06-15 DIAGNOSIS — F332 Major depressive disorder, recurrent severe without psychotic features: Secondary | ICD-10-CM | POA: Diagnosis not present

## 2018-06-15 DIAGNOSIS — E119 Type 2 diabetes mellitus without complications: Secondary | ICD-10-CM | POA: Insufficient documentation

## 2018-06-15 LAB — CBC
HCT: 48.1 % — ABNORMAL HIGH (ref 36.0–46.0)
Hemoglobin: 16.2 g/dL — ABNORMAL HIGH (ref 12.0–15.0)
MCH: 30.4 pg (ref 26.0–34.0)
MCHC: 33.7 g/dL (ref 30.0–36.0)
MCV: 90.2 fL (ref 80.0–100.0)
PLATELETS: 209 10*3/uL (ref 150–400)
RBC: 5.33 MIL/uL — ABNORMAL HIGH (ref 3.87–5.11)
RDW: 14.2 % (ref 11.5–15.5)
WBC: 15.9 10*3/uL — ABNORMAL HIGH (ref 4.0–10.5)
nRBC: 0 % (ref 0.0–0.2)

## 2018-06-15 LAB — RAPID URINE DRUG SCREEN, HOSP PERFORMED
Amphetamines: POSITIVE — AB
Barbiturates: NOT DETECTED
Benzodiazepines: NOT DETECTED
Cocaine: NOT DETECTED
OPIATES: NOT DETECTED
Tetrahydrocannabinol: NOT DETECTED

## 2018-06-15 LAB — I-STAT BETA HCG BLOOD, ED (MC, WL, AP ONLY): I-stat hCG, quantitative: <5 m[IU]/mL (ref <5)

## 2018-06-15 LAB — COMPREHENSIVE METABOLIC PANEL
ALK PHOS: 90 U/L (ref 38–126)
ALT: 51 U/L — ABNORMAL HIGH (ref 0–44)
ANION GAP: 9 (ref 5–15)
AST: 27 U/L (ref 15–41)
Albumin: 3.7 g/dL (ref 3.5–5.0)
BILIRUBIN TOTAL: 0.7 mg/dL (ref 0.3–1.2)
BUN: 10 mg/dL (ref 6–20)
CALCIUM: 9 mg/dL (ref 8.9–10.3)
CO2: 23 mmol/L (ref 22–32)
Chloride: 105 mmol/L (ref 98–111)
Creatinine, Ser: 0.64 mg/dL (ref 0.44–1.00)
GFR calc non Af Amer: >60 mL/min (ref >60)
Glucose, Bld: 123 mg/dL — ABNORMAL HIGH (ref 70–99)
Potassium: 3.6 mmol/L (ref 3.5–5.1)
Sodium: 137 mmol/L (ref 135–145)
TOTAL PROTEIN: 6.9 g/dL (ref 6.5–8.1)

## 2018-06-15 LAB — ETHANOL: Alcohol, Ethyl (B): <10 mg/dL (ref <10)

## 2018-06-15 LAB — ACETAMINOPHEN LEVEL

## 2018-06-15 LAB — SALICYLATE LEVEL

## 2018-06-15 MED ORDER — NICOTINE 21 MG/24HR TD PT24
21.0000 mg | MEDICATED_PATCH | Freq: Once | TRANSDERMAL | Status: DC
  Administered 2018-06-15: 21 mg via TRANSDERMAL
  Filled 2018-06-15: qty 1

## 2018-06-15 NOTE — ED Notes (Signed)
Mom Calvert Cantor): [phone].

## 2018-06-15 NOTE — [facility] Counselor (Signed)
Per Dr. Mariea Clonts and Jinny Blossom, NP patient does not meet in patient criteria. Discharge with OPT resources.

## 2018-06-15 NOTE — ED Triage Notes (Signed)
Pt reports SI with plan to OD.  Reports she was admitted at [facility] with IVC 3 weeks ago for same and stayed for a week.  She feels like it did not help her at all because she was focused on getting home to her children.  She is A&Ox 4, in NAD.  Has no complaints.

## 2018-06-15 NOTE — ED Provider Notes (Signed)
[facility] DEPT Provider Note   CSN: [id number] Arrival date & time: 06/15/18  0123     History   Chief Complaint Chief Complaint  Patient presents with  . Suicidal    HPI Michele Rojas is a 32 y.o. female.   32 year old female presents to the emergency department for psychiatric evaluation.  She was admitted to [facility] for psychiatric care 3 weeks ago after an intentional overdose.  She did not feel like this hospitalization helped her as she was preoccupied with returning home to her children.  She has continued to experience worsening depression with suicidal thoughts.  Has been taking her Wellbutrin as prescribed.  Denies any overdose tonight.  No homicidal thoughts, alcohol use, illicit drug use.  Is presenting today voluntarily and is open to repeat hospitalization.  Denies any other complaints.     Past Medical History:  Diagnosis Date  . ADD (attention deficit disorder)    no meds  . Anxiety   . Constipation   . Depression   . Fatigue   . Fatty liver   . Gallstones   . GERD (gastroesophageal reflux disease)   . History of chicken pox as a child  . History of preterm delivery    2015- 31wks, 2017- 35wks  . HLD (hyperlipidemia)   . HPV (human papilloma virus) infection   . HTN (hypertension)    No meds  . Hx of abnormal cervical Pap smear    ASCUS, LGSIL, CIN I, Colpo  . Hypothyroidism    no meds  . Meckel's diverticulitis   . Mood swings   . Obesity   . Preeclampsia    2015 & 2017 pregnancies  . Right ovarian cyst    2.5cm  . Smoker   . Tobacco abuse   . Vitamin deficiency     Patient Active Problem List   Diagnosis Date Noted  . PTSD (post-traumatic stress disorder) 05/04/2018  . Intentional benzodiazepine overdose ([place]) 05/01/2018  . Severe recurrent major depression without psychotic features ([place]) 05/01/2018  . Suicide attempt ([place]) 05/01/2018  . GERD (gastroesophageal reflux disease) 02/13/2018   . Fatty liver 02/13/2018  . Lower abdominal pain 02/13/2018  . Weight gain, abnormal 07/28/2017  . Routine general medical examination at a health care facility 05/25/2016  . Lipoma of lower extremity 03/08/2016  . Hyperlipidemia 02/22/2016  . Elevated transaminase level 02/20/2016  . Leukocytosis 02/20/2016  . 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 Essential hypertension 01/07/2016  . Placental abruption 09/23/2015  . Encounter for biometric screening 09/30/2013  . Screening for lipoid disorders 09/28/2013  . Diabetes mellitus screening 09/28/2013  . Hypothyroid 11/04/2011  . Depression 10/04/2011  . HEADACHE 11/18/2009  . Adjustment disorder with mixed anxiety and depressed mood 11/17/2009  . FATIGUE 10/06/2009  . TOBACCO USE 02/19/2009  . CHICKENPOX, HX OF 02/19/2009  . DEFICIENCY OF OTHER VITAMINS 04/29/2008  . OTHER CONSTIPATION 04/29/2008    Past Surgical History:  Procedure Laterality Date  . ACHILLES TENDON SURGERY Bilateral   . BONE MARROW BIOPSY  06/2017  . CESAREAN SECTION N/A 06/09/2014   Procedure: CESAREAN SECTION;  Surgeon: Farrel Gobble. Harrington Challenger, MD;  Location: [facility];  Service: Obstetrics;  Laterality: N/A;  . CESAREAN SECTION N/A 09/22/2015   Procedure: CESAREAN SECTION;  Surgeon: Jerelyn Charles, MD;  Location: [facility];  Service: Obstetrics;  Laterality: N/A;  . CHOLECYSTECTOMY    . CHOLECYSTECTOMY, LAPAROSCOPIC    . DIAGNOSTIC LAPAROSCOPY     removal of meckels  diverticulum  . LAPAROSCOPIC SMALL BOWEL RESECTION N/A 11/02/2017   Procedure: LAPAROSCOPIC REMOVAL OF MECKEL'S DIVERTICULUM ERAS PATHWAY;  Surgeon: Excell Seltzer, MD;  Location: [facility];  Service: General;  Laterality: N/A;  . TUBAL LIGATION    . WISDOM TOOTH EXTRACTION       OB History    Gravida  2   Para  2   Term      Preterm  2   AB      Living  2     SAB      TAB      Ectopic      Multiple  0   Live Births  2            Home Medications    Prior to Admission medications    Medication Sig Start Date End Date Taking? Authorizing Provider  buPROPion (WELLBUTRIN XL) 300 MG 24 hr tablet Take 1 tablet (300 mg total) by mouth daily. 05/08/18  Yes McNew, Tyson Babinski, MD  ranitidine (ZANTAC) 150 MG capsule Take 150 mg by mouth daily.   Yes [provider]  escitalopram (LEXAPRO) 10 MG tablet Take 1 tablet (10 mg total) by mouth daily. Patient not taking: Reported on 06/15/2018 05/08/18   Marylin Crosby, MD  hydrOXYzine (ATARAX/VISTARIL) 50 MG tablet Take 1 tablet (50 mg total) by mouth every 6 (six) hours as needed for anxiety. Patient not taking: Reported on 06/15/2018 05/08/18   Marylin Crosby, MD  levothyroxine (SYNTHROID, LEVOTHROID) 25 MCG tablet Take 1 tablet (25 mcg total) by mouth daily before breakfast. 05/08/18   McNew, Tyson Babinski, MD  traZODone (DESYREL) 50 MG tablet Take 1 tablet (50 mg total) by mouth at bedtime as needed for sleep. Patient not taking: Reported on 06/15/2018 05/08/18   Marylin Crosby, MD    Family History Family History  Problem Relation Age of Onset  . Hypertension Father   . Hyperlipidemia Father   . Prostate cancer Father   . Aneurysm Maternal Grandfather   . Depression Mother        after a car accident  . Colon polyps Mother   . Other Mother        ? CAD /heart disease  . Depression Brother        major depression  . Graves' disease Paternal Uncle   . Alzheimer's disease Paternal Grandmother   . Alzheimer's disease Paternal Grandfather   . Celiac disease Cousin        maternal cousin    Social History Social History   Tobacco Use  . Smoking status: Current Every Day Smoker    Packs/day: 1.00    Types: Cigarettes  . Smokeless tobacco: Never Used  Substance Use Topics  . Alcohol use: No    Alcohol/week: 0.0 standard drinks    Comment: rare  . Drug use: No     Allergies   Amoxicillin   Review of Systems Review of Systems Ten systems reviewed and are negative for acute change, except as noted in the HPI.     Physical Exam Updated Vital Signs BP 111/78 (BP Location: Left Arm)   Pulse 75   Temp 98.7 F (37.1 C) (Oral)   Resp 14   Ht '5\' 6"'  (1.676 m)   Wt 86.6 kg   SpO2 96%   BMI 30.83 kg/m   Physical Exam  Constitutional: She is oriented to person, place, and time. She appears well-developed and well-nourished. No distress.  Nontoxic appearing  and in NAD  HENT:  Head: Normocephalic and atraumatic.  Eyes: Conjunctivae and EOM are normal. No scleral icterus.  Neck: Normal range of motion.  Pulmonary/Chest: Effort normal. No respiratory distress.  Respirations even and unlabored  Musculoskeletal: Normal range of motion.  Neurological: She is alert and oriented to person, place, and time. She exhibits normal muscle tone. Coordination normal.  Skin: Skin is warm and dry. No rash noted. She is not diaphoretic. No erythema. No pallor.  Psychiatric: She has a normal mood and affect. Her behavior is normal.  SI with plans to overdose  Nursing note and vitals reviewed.    ED Treatments / Results  Labs (all labs ordered are listed, but only abnormal results are displayed) Labs Reviewed  COMPREHENSIVE METABOLIC PANEL - Abnormal; Notable for the following components:      Result Value   Glucose, Bld 123 (*)    ALT 51 (*)    All other components within normal limits  ACETAMINOPHEN LEVEL - Abnormal; Notable for the following components:   Acetaminophen (Tylenol), Serum <10 (*)    All other components within normal limits  CBC - Abnormal; Notable for the following components:   WBC 15.9 (*)    RBC 5.33 (*)    Hemoglobin 16.2 (*)    HCT 48.1 (*)    All other components within normal limits  RAPID URINE DRUG SCREEN, HOSP PERFORMED - Abnormal; Notable for the following components:   Amphetamines POSITIVE (*)    All other components within normal limits  ETHANOL  SALICYLATE LEVEL  I-STAT BETA HCG BLOOD, ED ([facility], [facility], [facility] ONLY)    EKG None  Radiology No results  found.  Procedures Procedures (including critical care time)  Medications Ordered in ED Medications  nicotine (NICODERM CQ - dosed in mg/24 hours) patch 21 mg (21 mg Transdermal Patch Applied 06/15/18 0316)     Initial Impression / Assessment and Plan / ED Course  I have reviewed the triage vital signs and the nursing notes.  Pertinent labs & imaging results that were available during my care of the patient were reviewed by me and considered in my medical decision making (see chart for details).     32 year old female presenting to the ED for medical clearance with respect to suicidal ideations with plans to overdose.  Was previously evaluated tonight at [facility].  Patient has been medically cleared and is pending placement for ongoing psychiatric care.  Disposition to be determined by oncoming ED provider.  The patient is presently in the department voluntarily.  Calm and cooperative.   Final Clinical Impressions(s) / ED Diagnoses   Final diagnoses:  Severe episode of recurrent major depressive disorder, without psychotic features [facility])    ED Discharge Orders    None       Antonietta Breach, PA-C 06/15/18 0446    Shanon Rosser, MD 06/15/18 [phone]

## 2018-06-15 NOTE — ED Notes (Signed)
Pt's mom left with most of belongings of pt. Pt has 1 bag of belongings to stay at hospital with her. Bag is labeled and at nurses station.

## 2018-06-15 NOTE — ED Notes (Signed)
Pt discharged safely with Follow up appointment.  Pt was in no distress.  Pt contracts for safety.  All belongings were returned .

## 2018-06-15 NOTE — ED Notes (Signed)
Patriciaann Clan, PA patient meets inpatient criteria. No [facility] Unit beds available per Doree Albee. Patient sent to [facility] ED for stay. TTS to secure placement. TTS Clinician spoke with Denzil Hughes Charge RN, to inform of patients arrival to [facility].

## 2018-06-15 NOTE — H&P (Signed)
Behavioral Health Medical Screening Exam  Michele Rojas is an 32 y.o. femaleaccompanied with her mother, here endorsing MDD with SI. She  Was recently admitted to [facility] 3 weeks ago, She is followed by [facility] as an out-patient.  Total Time spent with patient: 30 minutes  Psychiatric Specialty Exam: Physical Exam  Constitutional: She is oriented to person, place, and time. She appears well-developed and well-nourished. No distress.  Eyes: Pupils are equal, round, and reactive to light.  Respiratory: Effort normal and breath sounds normal. No respiratory distress.  Neurological: She is alert and oriented to person, place, and time. No cranial nerve deficit.  Skin: Skin is warm and dry. She is not diaphoretic.  Psychiatric: Her speech is normal. Judgment normal. She is withdrawn. Cognition and memory are normal. She exhibits a depressed mood. She expresses suicidal ideation. She expresses no homicidal ideation. She expresses suicidal plans. She expresses no homicidal plans.    Review of Systems  Constitutional: Negative for chills, diaphoresis, fever, malaise/fatigue and weight loss.  Psychiatric/Behavioral: Positive for depression and suicidal ideas. Negative for hallucinations and substance abuse. The patient is nervous/anxious and has insomnia.   All other systems reviewed and are negative.   There were no vitals taken for this visit.There is no height or weight on file to calculate BMI.  General Appearance: Casual  Eye Contact:  Fair  Speech:  Clear and Coherent  Volume:  Normal  Mood:  Depressed  Affect:  Congruent  Thought Process:  Goal Directed  Orientation:  Full (Time, Place, and Person)  Thought Content:  Logical  Suicidal Thoughts:  Yes.  with intent/plan  Homicidal Thoughts:  No  Memory:  Recent;   Fair  Judgement:  Fair  Insight:  Fair  Psychomotor Activity:  Normal  Concentration: Concentration: Fair  Recall:  [employer] of Knowledge:Fair   Language: Good  Akathisia:  Negative  Handed:  Right  AIMS (if indicated):     Assets:  Desire for Improvement  Sleep:       Musculoskeletal: Strength & Muscle Tone: within normal limits Gait & Station: normal Patient leans: N/A  There were no vitals taken for this visit.  Recommendations:  Based on my evaluation the patient does not appear to have an emergency medical condition.  Laverle Hobby, PA-C 06/15/2018, 5:18 AM

## 2018-06-15 NOTE — ED Notes (Addendum)
Pt. In burgundy scrubs. Pt. wanded by security. Pt. Belongings searched by NT, no contraband in socks, shoes, pant or jacket. Pt. Has 1 belongings bag. Pt. Has 1 pr. White shoes, 1 gray pant, 1 pr white socks, 1 black jacket, 1 gray/peach bra and 1 black sports bra. Pt. Belongings locked up in triage behind nurses station. No cell phone, no purse, no wallet and no money.

## 2018-06-15 NOTE — ED Notes (Signed)
Patient continues to endorse SI. Patient denies HI/AVH at this time. Plan of care discussed. Encouragement and support provided and safety maintain. Q 15 min safety check in place and video monitoring.

## 2018-06-15 NOTE — ED Notes (Signed)
Bed: WTR6 Expected date:  Expected time:  Means of arrival:  Comments: 

## 2018-06-15 NOTE — ED Notes (Signed)
Patient requesting placement locally to allow children and parents to participate in visitation.

## 2018-06-15 NOTE — ED Notes (Signed)
Patient has been calm and cooperative while on unit. Q 15 min safety checks remain in place and video monitoring.

## 2018-06-15 NOTE — BH Assessment (Signed)
[facility] Assessment Progress Note  Per Buford Dresser, DO, this pt does not require psychiatric hospitalization at this time.  Pt is to be discharged from [facility] with recommendation to follow up with the Partial Hospitalization Program at the [facility] [place].  Pt is scheduled for an intake appointment on Monday 06/19/2018 at 14:15.  This has been included in pt's discharge instructions.  Pt has been provided with printed information about the program, and with intake paperwork to complete at home and take with her to the appointment.  Pt's nurse, Nena Jordan, has been notified.  Jalene Mullet, [place] Triage Specialist [phone]

## 2018-06-15 NOTE — BH Assessment (Addendum)
Tele Assessment Note   Patient Name: Michele Rojas MRN: [id number] Referring Physician: Patriciaann Clan, PA Location of Patient: [place] Location of Provider: [place] is an 32 y.o. female presenting with SI with no present plan stating "if I was to do it I would overdose on medications". Patient reported nothing has to happen I just think about it". Patient was inpatient at [facility] 3 weeks ago for attempted overdose. Patient reporting increased depression stating she needs help with coping skills. Patient admitted to suicidal thoughts on and off since 42 year old son was born. Patient son had medical concerns after birth and had open heart surgery. Patient reported seeing Dr. Bayard Beaver, psychiatrist at [facility], along with Berniece Salines for outpatient therapy. Patient resides with husband and 2 children (2 and 4). Patient was calm and cooperative during assessment.    ---Patient requesting placement locally to allow children and parents to participate in visitation.   Diagnosis: Major depression disorder, Anxiety and PTSD  Past Medical History:  Past Medical History:  Diagnosis Date  . ADD (attention deficit disorder)    no meds  . Anxiety   . Constipation   . Depression   . Fatigue   . Fatty liver   . Gallstones   . GERD (gastroesophageal reflux disease)   . History of chicken pox as a child  . History of preterm delivery    2015- 31wks, 2017- 35wks  . HLD (hyperlipidemia)   . HPV (human papilloma virus) infection   . HTN (hypertension)    No meds  . Hx of abnormal cervical Pap smear    ASCUS, LGSIL, CIN I, [place]  . Hypothyroidism    no meds  . Meckel's diverticulitis   . Mood swings   . Obesity   . Preeclampsia    2015 & 2017 pregnancies  . Right ovarian cyst    2.5cm  . Smoker   . Tobacco abuse   . Vitamin deficiency     Past Surgical History:  Procedure Laterality Date  .  ACHILLES TENDON SURGERY Bilateral   . BONE MARROW BIOPSY  06/2017  . CESAREAN SECTION N/A 06/09/2014   Procedure: CESAREAN SECTION;  Surgeon: Farrel Gobble. Harrington Challenger, MD;  Location: [facility];  Service: Obstetrics;  Laterality: N/A;  . CESAREAN SECTION N/A 09/22/2015   Procedure: CESAREAN SECTION;  Surgeon: Jerelyn Charles, MD;  Location: [facility];  Service: Obstetrics;  Laterality: N/A;  . CHOLECYSTECTOMY    . CHOLECYSTECTOMY, LAPAROSCOPIC    . DIAGNOSTIC LAPAROSCOPY     removal of meckels diverticulum  . LAPAROSCOPIC SMALL BOWEL RESECTION N/A 11/02/2017   Procedure: LAPAROSCOPIC REMOVAL OF MECKEL'S DIVERTICULUM ERAS PATHWAY;  Surgeon: Excell Seltzer, MD;  Location: [facility];  Service: General;  Laterality: N/A;  . TUBAL LIGATION    . WISDOM TOOTH EXTRACTION      Family History:  Family History  Problem Relation Age of Onset  . Hypertension Father   . Hyperlipidemia Father   . Prostate cancer Father   . Aneurysm Maternal Grandfather   . Depression Mother        after a car accident  . Colon polyps Mother   . Other Mother        ? CAD /heart disease  . Depression Brother        major depression  . Graves' disease Paternal Uncle   . Alzheimer's disease Paternal Grandmother   . Alzheimer's disease Paternal Grandfather   .  Celiac disease Cousin        maternal cousin    Social History:  reports that she has been smoking cigarettes. She has been smoking about 1.00 pack per day. She has never used smokeless tobacco. She reports that she does not drink alcohol or use drugs.  Additional Social History:  Alcohol / Drug Use Pain Medications: see MAR Prescriptions: see MAR Over the Counter: see MAR  CIWA:   COWS:    Allergies:  Allergies  Allergen Reactions  . Amoxicillin Other (See Comments)    REACTION: yeast infection in mouth Has patient had a PCN reaction causing immediate rash, facial/tongue/throat swelling, SOB or lightheadedness with hypotension: No Has patient had a PCN reaction  causing severe rash involving mucus membranes or skin necrosis: No Has patient had a PCN reaction that required hospitalization no Has patient had a PCN reaction occurring within the last 10 years: Yes If all of the above answers are "NO", then may proceed with Cephalosporin use.    Home Medications:  (Not in a hospital admission)  OB/GYN Status:  No LMP recorded.  General Assessment Data Location of Assessment: [facility] Assessment Services TTS Assessment: In system Is this a Tele or Face-to-Face Assessment?: Face-to-Face Is this an Initial Assessment or a Re-assessment for this encounter?: Initial Assessment Patient Accompanied by:: Parent(Jannett Hassell Done, mother) Language Other than English: No Living Arrangements: (family home) What gender do you identify as?: Female Marital status: Married Pregnancy Status: Unknown Living Arrangements: Spouse/significant other, Children Can pt return to current living arrangement?: Yes Admission Status: Voluntary Is patient capable of signing voluntary admission?: Yes Referral Source: Self/Family/Friend  Medical Screening Exam [facility] Walk-in ONLY) Medical Exam completed: Yes  Crisis Care Plan Living Arrangements: Spouse/significant other, Children Legal Guardian: (self) Name of Psychiatrist: (Dr. Bayard Beaver, [facility]) Name of Therapist: Harlan Stains)  Education Status Is patient currently in school?: No Highest grade of school patient has completed: Associate's degree in [employer] Is the patient employed, unemployed or receiving disability?: Unemployed  Risk to self with the past 6 months Suicidal Ideation: Yes-Currently Present Has patient been a risk to self within the past 6 months prior to admission? : Yes Suicidal Intent: Yes-Currently Present Has patient had any suicidal intent within the past 6 months prior to admission? : Yes Is patient at risk for suicide?: Yes Suicidal Plan?: Yes-Currently Present Has patient  had any suicidal plan within the past 6 months prior to admission? : Yes Specify Current Suicidal Plan: (overdose on medication) Access to Means: Yes Specify Access to Suicidal Means: (medications in the home) What has been your use of drugs/alcohol within the last 12 months?: ("varies alcohol, marijuana, Xanax and clonipen) Previous Attempts/Gestures: Yes How many times?: (1) Triggers for Past Attempts: Unpredictable Intentional Self Injurious Behavior: None Family Suicide History: No Recent stressful life event(s): (unpredictable) Persecutory voices/beliefs?: No Depression: Yes Depression Symptoms: Feeling angry/irritable, Feeling worthless/self pity, Guilt, Isolating  Risk to Others within the past 6 months Homicidal Ideation: No Does patient have any lifetime risk of violence toward others beyond the six months prior to admission? : No Thoughts of Harm to Others: No Current Homicidal Intent: No Current Homicidal Plan: No Access to Homicidal Means: No History of harm to others?: No Assessment of Violence: None Noted Does patient have access to weapons?: No Criminal Charges Pending?: No Does patient have a court date: No Is patient on probation?: No  Psychosis Hallucinations: None noted Delusions: None noted  Mental Status Report Appearance/Hygiene: Improved Eye  Contact: Poor Motor Activity: Unremarkable Speech: Logical/coherent Level of Consciousness: Alert Mood: Depressed, Helpless, Sad Affect: Sad, Depressed Anxiety Level: Moderate Thought Processes: Coherent Judgement: Unimpaired Orientation: Person, Place, Time, Situation Obsessive Compulsive Thoughts/Behaviors: None  Cognitive Functioning Concentration: Fair Memory: Recent Intact Is patient IDD: No Insight: Fair Impulse Control: Fair Appetite: Fair Have you had any weight changes? : No Change Sleep: No Change Total Hours of Sleep: (7) Vegetative Symptoms: None  ADLScreening [facility] Assessment  Services) Patient's cognitive ability adequate to safely complete daily activities?: Yes Patient able to express need for assistance with ADLs?: Yes Independently performs ADLs?: Yes (appropriate for developmental age)  Prior Inpatient Therapy Prior Inpatient Therapy: Yes Prior Therapy Dates: (3 weeks ago) Prior Therapy Facilty/Provider(s): [facility]) Reason for Treatment: (attempted overdose)  Prior Outpatient Therapy Prior Outpatient Therapy: Yes Prior Therapy Dates: (present) Prior Therapy Facilty/Provider(s): ([facility]) Reason for Treatment: (depression, PTSD and anxiety) Does patient have an ACCT team?: No Does patient have Intensive In-House Services?  : No Does patient have [facility] services? : No Does patient have P4CC services?: No  ADL Screening (condition at time of admission) Patient's cognitive ability adequate to safely complete daily activities?: Yes Patient able to express need for assistance with ADLs?: Yes Independently performs ADLs?: Yes (appropriate for developmental age)  Disposition:  Disposition Initial Assessment Completed for this Encounter: Yes  Patriciaann Clan, PA patient meets inpatient criteria. No [facility] Unit beds available per Doree Albee. Patient sent to [facility] for stay. TTS to secure placement. TTS Clinician spoke with Denzil Hughes Charge RN, to inform of patients arrival to [facility].  This service was provided via telemedicine using a 2-way, interactive audio and video technology.  Names of all persons participating in this telemedicine service and their role in this encounter. Name: Michele Rojas Role: patient  Name: Calvert Cantor Role: mother  Name: Kirtland Bouchard Role: TTS Clinician  Name:  Role:     Venora Maples 06/15/2018 1:00 AM

## 2018-06-15 NOTE — Discharge Instructions (Signed)
For your behavioral health needs, you are advised to follow up with the Partial Hospitalization Program (PHP) at the [facility] [place].  This program meets Monday - Thursday from 9:00 am - 2:00 pm, and Friday from 8:00 am - 1:00 pm.  You are scheduled for an intake appointment on Monday June 19, 2018 at 2:15 pm.  Please be sure to complete paperwork given to you by ED staff and to take it with you.  If you have any questions, contact Jackey Loge or Loistine Chance at the phone number indicated below:       [facility]. [address] [place], [place] [address]      Contact person: xxx      [phone]

## 2018-06-15 NOTE — BHH Suicide Risk Assessment (Signed)
[facility] Discharge Suicide Risk Assessment   Principal Problem: Severe recurrent major depression without psychotic features [facility]) Discharge Diagnoses:  Patient Active Problem List   Diagnosis Date Noted  . PTSD (post-traumatic stress disorder) [F43.10] 05/04/2018  . Intentional benzodiazepine overdose ([place]) [T42.4X2A] 05/01/2018  . Severe recurrent major depression without psychotic features ([place]) [F33.2] 05/01/2018  . Suicide attempt ([place]) [T14.91XA] 05/01/2018  . GERD (gastroesophageal reflux disease) [K21.9] 02/13/2018  . Fatty liver [K76.0] 02/13/2018  . Lower abdominal pain [R10.30] 02/13/2018  . Weight gain, abnormal [R63.5] 07/28/2017  . Routine general medical examination at a health care facility [Z00.00] 05/25/2016  . Lipoma of lower extremity [D17.20] 03/08/2016  . Hyperlipidemia [E78.5] 02/22/2016  . Elevated transaminase level [R74.0] 02/20/2016  . Leukocytosis [D72.829] 02/20/2016  . Post partum depression [O99.345, F53.0] 01/08/2016  . Essential hypertension [I10] 01/07/2016  . Placental abruption [O45.90] 09/23/2015  . Encounter for biometric screening [Z01.89] 09/30/2013  . Screening for lipoid disorders [Z13.220] 09/28/2013  . Diabetes mellitus screening [Z13.1] 09/28/2013  . Hypothyroid [E03.9] 11/04/2011  . Depression [F32.9] 10/04/2011  . HEADACHE [R51] 11/18/2009  . Adjustment disorder with mixed anxiety and depressed mood [F43.23] 11/17/2009  . FATIGUE [R53.81, R53.83] 10/06/2009  . TOBACCO USE [F17.200] 02/19/2009  . CHICKENPOX, HX OF [Z91.89] 02/19/2009  . DEFICIENCY OF OTHER VITAMINS [E56.8] 04/29/2008  . OTHER CONSTIPATION [K59.09] 04/29/2008   [name] reports that she has been suicidal for a month. She denies any plan or intention to harm self. She would like to learn positive coping skills to manage her depression. She was diagnosed with depression several years ago. She reports that she was started on Lexapro while hospitalized at [facility]. She denies  attempting suicide when she ingested Xanax and Klonopin. She reports that she was self-medicating. She is seen at [facility]. She missed her therapy appointment this morning. She reports that this would be her first appointment. She denies access to guns/weapons.   Total Time spent with patient: 30 minutes  Musculoskeletal: Strength & Muscle Tone: within normal limits Gait & Station: UTA since patient is lying in bed. Patient leans: N/A  Psychiatric Specialty Exam: Review of Systems  Psychiatric/Behavioral: Positive for depression and suicidal ideas. Negative for substance abuse.  All other systems reviewed and are negative.   Blood pressure 111/78, pulse 75, temperature 98.7 F (37.1 C), temperature source Oral, resp. rate 14, height 5\' 6"  (1.676 m), weight 86.6 kg, SpO2 96 %.Body mass index is 30.83 kg/m.  General Appearance: Fairly Groomed, young, Caucasian female, wearing paper hospital scrubs and lying in bed. NAD.   Eye Contact::  Good  Speech:  Clear and Coherent and Normal Rate  Volume:  Normal  Mood:  Depressed  Affect:  Constricted  Thought Process:  Goal Directed, Linear and Descriptions of Associations: Intact  Orientation:  Full (Time, Place, and Person)  Thought Content:  Logical  Suicidal Thoughts:  Yes.  without intent/plan  Homicidal Thoughts:  No  Memory:  Immediate;   Good Recent;   Good Remote;   Good  Judgement:  Fair  Insight:  Fair  Psychomotor Activity:  Normal  Concentration:  Good  Recall:  Good  Fund of Knowledge:Good  Language: Good  Akathisia:  No  Handed:  Right  AIMS (if indicated):   N/A  Assets:  Communication Skills Desire for Improvement Financial Resources/Insurance Housing Intimacy Social Support  Sleep:   N/A  Cognition: WNL  ADL's:  Intact   Mental Status Per Nursing Assessment::  On Admission:   "Psychiatric: Her speech is normal. Judgment normal. She is withdrawn. Cognition and memory are normal.  She exhibits a depressed mood. She expresses suicidal ideation. She expresses no homicidal ideation. She expresses suicidal plans. She expresses no homicidal plans."   Demographic Factors:  Caucasian and Unemployed  Loss Factors: NA  Historical Factors: NA  Risk Reduction Factors:   Responsible for children under 76 years of age, Sense of responsibility to family, Living with another person, especially a relative, Positive social support and Positive therapeutic relationship  Continued Clinical Symptoms:  More than one psychiatric diagnosis Previous Psychiatric Diagnoses and Treatments Medical Diagnoses and Treatments/Surgeries  Cognitive Features That Contribute To Risk:  None    Suicide Risk:  Minimal: No identifiable suicidal ideation.  Patients presenting with no risk factors but with morbid ruminations; may be classified as minimal risk based on the severity of the depressive symptoms  Assessment:  Michele Rojas is a 32 y.o. female who was admitted with SI in the setting of depression. She reports SI with no plan or intention to harm self. She was discharged from [facility] hospital less than one month ago after ingesting Klonopin and Xanax (suicide attempt versus self-medicating). She would like positive coping skills to manage her stressors. She will be referred to [facility] partial hospitalization program. She is able to safety plan. She does not warrant inpatient psychiatric hospitalization at this time.   Plan Of Care/Follow-up recommendations:  -Continue psychotropic medications as prescribed. -Follow up with your outpatient mental health provider. -Patient will be referred to [facility] PHP.  -Discharge home.   Faythe Dingwall, DO 06/15/2018, 11:46 AM

## 2018-06-19 ENCOUNTER — Other Ambulatory Visit (HOSPITAL_COMMUNITY): Payer: 59 | Attending: Psychiatry | Admitting: Licensed Clinical Social Worker

## 2018-06-19 DIAGNOSIS — F419 Anxiety disorder, unspecified: Secondary | ICD-10-CM | POA: Diagnosis not present

## 2018-06-19 DIAGNOSIS — I10 Essential (primary) hypertension: Secondary | ICD-10-CM | POA: Insufficient documentation

## 2018-06-19 DIAGNOSIS — Z79890 Hormone replacement therapy (postmenopausal): Secondary | ICD-10-CM | POA: Insufficient documentation

## 2018-06-19 DIAGNOSIS — K219 Gastro-esophageal reflux disease without esophagitis: Secondary | ICD-10-CM | POA: Insufficient documentation

## 2018-06-19 DIAGNOSIS — Z8249 Family history of ischemic heart disease and other diseases of the circulatory system: Secondary | ICD-10-CM | POA: Diagnosis not present

## 2018-06-19 DIAGNOSIS — Z79899 Other long term (current) drug therapy: Secondary | ICD-10-CM | POA: Diagnosis not present

## 2018-06-19 DIAGNOSIS — E039 Hypothyroidism, unspecified: Secondary | ICD-10-CM | POA: Diagnosis not present

## 2018-06-19 DIAGNOSIS — F332 Major depressive disorder, recurrent severe without psychotic features: Secondary | ICD-10-CM | POA: Diagnosis present

## 2018-06-19 DIAGNOSIS — F17210 Nicotine dependence, cigarettes, uncomplicated: Secondary | ICD-10-CM | POA: Insufficient documentation

## 2018-06-19 DIAGNOSIS — T424X2A Poisoning by benzodiazepines, intentional self-harm, initial encounter: Secondary | ICD-10-CM | POA: Insufficient documentation

## 2018-06-19 DIAGNOSIS — Z880 Allergy status to penicillin: Secondary | ICD-10-CM | POA: Insufficient documentation

## 2018-06-19 DIAGNOSIS — E785 Hyperlipidemia, unspecified: Secondary | ICD-10-CM | POA: Diagnosis not present

## 2018-06-19 DIAGNOSIS — Z818 Family history of other mental and behavioral disorders: Secondary | ICD-10-CM | POA: Diagnosis not present

## 2018-06-20 ENCOUNTER — Other Ambulatory Visit (INDEPENDENT_AMBULATORY_CARE_PROVIDER_SITE_OTHER): Payer: Managed Care, Other (non HMO)

## 2018-06-20 DIAGNOSIS — R740 Nonspecific elevation of levels of transaminase and lactic acid dehydrogenase [LDH]: Secondary | ICD-10-CM | POA: Diagnosis not present

## 2018-06-20 DIAGNOSIS — R7401 Elevation of levels of liver transaminase levels: Secondary | ICD-10-CM

## 2018-06-20 DIAGNOSIS — E039 Hypothyroidism, unspecified: Secondary | ICD-10-CM

## 2018-06-20 LAB — HEPATIC FUNCTION PANEL
ALT: 78 U/L — AB (ref 0–35)
AST: 46 U/L — AB (ref 0–37)
Albumin: 4.2 g/dL (ref 3.5–5.2)
Alkaline Phosphatase: 92 U/L (ref 39–117)
BILIRUBIN TOTAL: 0.4 mg/dL (ref 0.2–1.2)
Bilirubin, Direct: 0.1 mg/dL (ref 0.0–0.3)
TOTAL PROTEIN: 7.2 g/dL (ref 6.0–8.3)

## 2018-06-20 LAB — TSH: TSH: 7.49 u[IU]/mL — AB (ref 0.35–4.50)

## 2018-06-21 ENCOUNTER — Telehealth (HOSPITAL_COMMUNITY): Payer: Self-pay | Admitting: Professional

## 2018-06-21 ENCOUNTER — Other Ambulatory Visit (HOSPITAL_COMMUNITY): Payer: 59 | Admitting: Licensed Clinical Social Worker

## 2018-06-21 ENCOUNTER — Telehealth: Payer: Self-pay | Admitting: *Deleted

## 2018-06-21 ENCOUNTER — Telehealth: Payer: Self-pay | Admitting: Family Medicine

## 2018-06-21 ENCOUNTER — Encounter (HOSPITAL_COMMUNITY): Payer: Self-pay | Admitting: Family

## 2018-06-21 VITALS — BP 136/84 | HR 94 | Ht 67.0 in | Wt 197.0 lb

## 2018-06-21 DIAGNOSIS — F332 Major depressive disorder, recurrent severe without psychotic features: Secondary | ICD-10-CM | POA: Diagnosis not present

## 2018-06-21 DIAGNOSIS — E039 Hypothyroidism, unspecified: Secondary | ICD-10-CM

## 2018-06-21 DIAGNOSIS — R7401 Elevation of levels of liver transaminase levels: Secondary | ICD-10-CM

## 2018-06-21 DIAGNOSIS — R740 Nonspecific elevation of levels of transaminase and lactic acid dehydrogenase [LDH]: Secondary | ICD-10-CM

## 2018-06-21 MED ORDER — TRAZODONE HCL 50 MG PO TABS: 50.0000 mg | ORAL_TABLET | Freq: Every evening | ORAL | 0 refills | Status: DC | PRN

## 2018-06-21 MED ORDER — LEVOTHYROXINE SODIUM 50 MCG PO TABS: 50.0000 ug | ORAL_TABLET | Freq: Every day | ORAL | 11 refills | Status: DC

## 2018-06-21 MED ORDER — HYDROXYZINE HCL 50 MG PO TABS: 50.0000 mg | ORAL_TABLET | Freq: Two times a day (BID) | ORAL | 0 refills | Status: DC | PRN

## 2018-06-21 NOTE — Telephone Encounter (Signed)
Left VM requesting pt to call the office back regarding her recent labs
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
## 2018-06-21 NOTE — Telephone Encounter (Signed)
-----   Message from Abner Greenspan, MD sent at 06/20/2018  5:31 PM EST ----- Increase levothyroxine from 25 to 50 mcg Please send in levothyroxine 50 mcg 1 po qd #30 11 ref  Re check TSH approx 6 wk please  Also her liver tests are still elevated  ? Any recent alcohol or tylenol or new medicines
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
## 2018-06-21 NOTE — Telephone Encounter (Signed)
-----   Message from Tammi Sou, [place] sent at 06/21/2018  3:41 PM EST ----- Pt notified of lab results and Dr. Marliss Coots comments. Rx sent to pharmacy and 6 week lab appt scheduled.  Pt said she hasn't had any recent alcohol or tylenol the only med she has taken recently is ibuprofen

## 2018-06-21 NOTE — Telephone Encounter (Signed)
Rx sent see lab results
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
## 2018-06-21 NOTE — Progress Notes (Signed)
Spoke with Ricky Ala, NP by phone later in the day regarding patient's request for a new Hydroxyzine order and agreed to send in 50 mg, one twice a day as needed along with resending in patient's Trazodone 50 mg, one at bedtime as needed for sleep order, due to this was printed by mistake.  Both orders sent in as verbally approved and provided by NP to patient's [facility] on [address].  Called patient to inform both orders had been e-scribed to her [facility] as requested and left message on her voicemail to inform these orders were confirmed received at her [facility] for her to pick up today and to call back if any problems obtaining.

## 2018-06-22 ENCOUNTER — Encounter (HOSPITAL_COMMUNITY): Payer: Self-pay

## 2018-06-22 ENCOUNTER — Other Ambulatory Visit (HOSPITAL_COMMUNITY): Payer: 59 | Admitting: Occupational Therapy

## 2018-06-22 ENCOUNTER — Other Ambulatory Visit (HOSPITAL_COMMUNITY): Payer: 59 | Admitting: Licensed Clinical Social Worker

## 2018-06-22 ENCOUNTER — Other Ambulatory Visit: Payer: Self-pay

## 2018-06-22 DIAGNOSIS — F332 Major depressive disorder, recurrent severe without psychotic features: Secondary | ICD-10-CM

## 2018-06-22 DIAGNOSIS — R4589 Other symptoms and signs involving emotional state: Secondary | ICD-10-CM

## 2018-06-22 DIAGNOSIS — F070 Personality change due to known physiological condition: Secondary | ICD-10-CM

## 2018-06-22 NOTE — Therapy (Signed)
[place], [place], [address] Phone: [phone]   Fax:  [phone]  Occupational Therapy Evaluation  Patient Details  Name: Michele Rojas MRN: [id number] Date of Birth: Jul 10, 1986 Referring Provider (OT): Ricky Ala, NP   Encounter Date: 06/22/2018  OT End of Session - 06/22/18 1306    Visit Number  1    Number of Visits  16    Date for OT Re-Evaluation  07/20/18    Authorization Type  [employer]    OT Start Time  1030    OT Stop Time  1200    OT Time Calculation (min)  90 min    Activity Tolerance  Patient tolerated treatment well    Behavior During Therapy  [facility] for tasks assessed/performed       Past Medical History:  Diagnosis Date  . ADD (attention deficit disorder)    no meds  . Anxiety   . Constipation   . Depression   . Fatigue   . Fatty liver   . Gallstones   . GERD (gastroesophageal reflux disease)   . History of chicken pox as a child  . History of preterm delivery    2015- 31wks, 2017- 35wks  . HLD (hyperlipidemia)   . HPV (human papilloma virus) infection   . HTN (hypertension)    No meds  . Hx of abnormal cervical Pap smear    ASCUS, LGSIL, CIN I, [place]  . Hypothyroidism    no meds  . Meckel's diverticulitis   . Mood swings   . Obesity   . Preeclampsia    2015 & 2017 pregnancies  . Right ovarian cyst    2.5cm  . Smoker   . Tobacco abuse   . Vitamin deficiency     Past Surgical History:  Procedure Laterality Date  . ACHILLES TENDON SURGERY Bilateral   . BONE MARROW BIOPSY  06/2017  . CESAREAN SECTION N/A 06/09/2014   Procedure: CESAREAN SECTION;  Surgeon: Farrel Gobble. Harrington Challenger, MD;  Location: [facility];  Service: Obstetrics;  Laterality: N/A;  . CESAREAN SECTION N/A 09/22/2015   Procedure: CESAREAN SECTION;  Surgeon: Jerelyn Charles, MD;  Location: [facility];  Service: Obstetrics;  Laterality: N/A;  . CHOLECYSTECTOMY    . CHOLECYSTECTOMY, LAPAROSCOPIC    . DIAGNOSTIC  LAPAROSCOPY     removal of meckels diverticulum  . LAPAROSCOPIC SMALL BOWEL RESECTION N/A 11/02/2017   Procedure: LAPAROSCOPIC REMOVAL OF MECKEL'S DIVERTICULUM ERAS PATHWAY;  Surgeon: Excell Seltzer, MD;  Location: [facility];  Service: General;  Laterality: N/A;  . TUBAL LIGATION    . WISDOM TOOTH EXTRACTION      There were no vitals filed for this visit.  Subjective Assessment - 06/22/18 1304    Currently in Pain?  No/denies        [facility] OT Assessment - 06/22/18 0001      Assessment   Medical Diagnosis  Major depressive episode, recurrent severe    Referring Provider (OT)  Ricky Ala, NP    Onset Date/Surgical Date  06/22/18      Precautions   Precautions  None      Restrictions   Weight Bearing Restrictions  No      Balance Screen   Has the patient fallen in the past 6 months  No    Has the patient had a decrease in activity level because of a fear of falling?   No    Is the patient reluctant to  leave their home because of a fear of falling?   No       OT assessment: OCAIRS  Diagnosis: Major depressive disorder, recurrent severe  Past medical history/referral information: Pt presents to [facility] after an inpatient stay in a [facility] facility about one month prior. Pt with continued worsening symptoms.  Subjective: Pt presents to PHP with mildly decreased personal hygiene and dressed very casual. 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  Living situation: Currently lives with husband and 2 children (21 and 41 years old)  ADLs/IADLs: Pt notices a decreased engagement in these areas  Sleep: Pt reports she usually averages 6-7 hours a night with difficulty falling asleep. She was recent prescribed sleep medicine that has improved her sleep at this time.  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  Leisure: Mentions reading and coloring  Social support: Pt mentions mother and husband as being supports; been isolating more as of recent.  Struggles: Coping skills, leisure, social participation   [place] Interview Summary of Client Scores:  FACILITATES PARTICIPATION IN OCCUPATION  ALLOWS PARTICIPATION IN OCCUPATION INHIBITS PARTICIPATION IN OCCUPATION RESTRICTS PARTICIPATION IN OCCUPATION COMMENTS  ROLES              X  Decreased satisfaction with roles (mother, cleans church) mentions wanting to look for "something" that gives her meaning  HABITS              X  Minimal routine, mother helps pt out with her children. Still mentions not feeling accomplished.  PERSONAL CAUSATION               X Unable to identify things done well/proud of, cannot predict anything for 6 months  VALUES              X  Loose identification   INTERESTS             X   Mentions reading and doing coloring/zen tangles when she is feeling worked up  [place]               X None stated  LONG TERM GOALS               X None stated  INTERPETATION OF PAST EXPERIENCES               X States it has always been bad, now is the worst. Unable to identify any positivity  PHYSICAL ENVIRONMENT             X   Mentions to be managing physical env (finances, appointments, etc) at baseline, just with increased stress  SOCIAL ENVIRONMENT              X             Increased isolation, estranged from best and only close friend  [place]              X  Difficulty adjusting, desires routine and structure    Need for Occupational Therapy:  4 Shows positive occupational participation, no need  for OT.   3 Need for minimal intervention/consultative participation     X 2 Need for OT intervention indicated to restore/improve participation   1 Need for extensive OT intervention indicated to improve  participation.  Referral for follow up services also recommended.    Assessment:  Patient demonstrates behavior that inhibits participation in occupation.  Patient will benefit from occupational therapy intervention in order to improve time management, financial management, stress management, job readiness skills, social skills, and health management skills in preparation to return to full time community living and to be a productive community member.    Plan:  Patient will participate in skilled occupational therapy sessions individually or in a group setting to improve coping skills, psychosocial skills, and emotional skills required to return to prior level of function.  Treatment will be 4 times per week for 4 weeks.      S: "I need to improve my posture when I am in an argument with my husband"   O: Education given on communication skills, breaking down into three parts: nonverbal, verbal, and listening. Nonverbal communication discussed in detail this date in regard to definition, appropriate nonverbals, and how to improve personal nonverbals. Video clip used to display variety of nonverbals to facilitate further discussion. Multiple role playing situations demonstrated between partners to personally experience a variety of nonverbal communication. Nonverbal communication continually described in reference to electronic communication. Pt encouraged to share personal strengths and weaknesses to improve current communication skills to increase independent community reintegration. Pt asked to name one goal to improve this date.   A: Pt presents to group with blunted affect, engaged and participatory throughout session. Education on nonverbals and how to improve current skills received, pt offering insight in how to improve current practices. Pt participatory throughout entirety of activities and describing thoughts from video/role play during session. Pt shares she has most difficulty with  conversations with her husband. She wants to work to improve her posture and body movements when in arguments with her husband.  P: Pt provided with communication skills to implement when reintegrating into community dwelling. OT will continue follow up with communication skills for successful implementation in daily life.                OT Education - 06/22/18 1305    Education Details  education given on active listening    Methods  Explanation;Handout    Comprehension  Verbalized understanding       OT Short Term Goals - 06/22/18 1309      OT SHORT TERM GOAL #1   Title  Pt will be education on strategies to improve psychosocial skills needed to participate fully in all daily, leisure, and work activities    Time  4    Period  Weeks    Status  New    Target Date  07/20/18      OT SHORT TERM GOAL #2   Title  Pt will apply psychosocial skills and coping mechanisms to daily activities in order to function independently and reintegrate into community dwelling    Time  4    Period  Weeks    Status  New    Target Date  07/20/18      OT SHORT TERM GOAL #3   Title  Pt will choose and/or engage in 1-3 socially engaging leisure activities to improve social participation upon reintegrating into community    Time  4    Period  Weeks    Status  New    Target Date  07/20/18      OT SHORT TERM GOAL #4   Title  Pt will explore 1-3 new meaningful roles to engage in upon reintegrating into community    Time  4    Period  Weeks    Status  New    Target Date  07/20/18      OT SHORT TERM GOAL #5   Title  Pt will engage in goal setting to improve funcitonal BADL/IADL routine upon reintegrating into community    Time  4    Period  Weeks    Status  New               Plan - 06/22/18 1307    Occupational performance deficits (Please refer to evaluation for details):  ADL's;IADL's;Rest and Sleep;Work;Leisure;Social Participation    Rehab Potential  Good    OT Frequency   4x / week    OT Duration  4 weeks    OT Treatment/Interventions  Psychosocial skills training;Coping strategies training;Self-care/ADL training;Other (comment)   community reintegration   Consulted and Agree with Plan of Care  Patient       Patient will benefit from skilled therapeutic intervention in order to improve the following deficits and impairments:  Decreased coping skills, Decreased psychosocial skills, Other (comment)(decreased ability to engage in BADL and reintegrate into community dwelling)  Visit Diagnosis: Organic personality disorder  Difficulty coping    Problem List Patient Active Problem List   Diagnosis Date Noted  . PTSD (post-traumatic stress disorder) 05/04/2018  . Intentional benzodiazepine overdose ([place]) 05/01/2018  . Severe recurrent major depression without psychotic features ([place]) 05/01/2018  . Suicide attempt ([place]) 05/01/2018  . GERD (gastroesophageal reflux disease) 02/13/2018  . Fatty liver 02/13/2018  . Lower abdominal pain 02/13/2018  . Weight gain, abnormal 07/28/2017  . Routine general medical examination at a health care facility 05/25/2016  . Lipoma of lower extremity 03/08/2016  . Hyperlipidemia 02/22/2016  . Elevated transaminase level 02/20/2016  . Leukocytosis 02/20/2016  . 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 Essential hypertension 01/07/2016  . Placental abruption 09/23/2015  . Encounter for biometric screening 09/30/2013  . Screening for lipoid disorders 09/28/2013  . Diabetes mellitus screening 09/28/2013  . Hypothyroid 11/04/2011  . Depression 10/04/2011  . HEADACHE 11/18/2009  . Adjustment disorder with mixed anxiety and depressed mood 11/17/2009  . FATIGUE 10/06/2009  . TOBACCO USE 02/19/2009  . CHICKENPOX, HX OF 02/19/2009  . DEFICIENCY OF OTHER VITAMINS 04/29/2008  . OTHER CONSTIPATION 04/29/2008   Zenovia Jarred, MSOT, OTR/L Behavioral Health OT/ Acute Relief OT PHP Office: [phone]  Zenovia Jarred 06/22/2018, 1:14 PM  [facility] HOSPITALIZATION PROGRAM [place], [place], [address] Phone: [phone]   Fax:  [phone]  Name: Michele Rojas MRN: [id number] Date of Birth: [birth year]

## 2018-06-23 ENCOUNTER — Telehealth (HOSPITAL_COMMUNITY): Payer: Self-pay | Admitting: Professional

## 2018-06-23 ENCOUNTER — Encounter (HOSPITAL_COMMUNITY): Payer: Self-pay | Admitting: Occupational Therapy

## 2018-06-23 ENCOUNTER — Other Ambulatory Visit (HOSPITAL_COMMUNITY): Payer: 59 | Admitting: Occupational Therapy

## 2018-06-23 ENCOUNTER — Other Ambulatory Visit (HOSPITAL_COMMUNITY): Payer: 59 | Admitting: Licensed Clinical Social Worker

## 2018-06-23 DIAGNOSIS — F332 Major depressive disorder, recurrent severe without psychotic features: Secondary | ICD-10-CM

## 2018-06-23 DIAGNOSIS — F070 Personality change due to known physiological condition: Secondary | ICD-10-CM

## 2018-06-23 DIAGNOSIS — R4589 Other symptoms and signs involving emotional state: Secondary | ICD-10-CM

## 2018-06-23 NOTE — Therapy (Signed)
[place], [place], [address] Phone: [phone]   Fax:  [phone]  Occupational Therapy Treatment  Patient Details  Name: Michele Rojas MRN: [id number] Date of Birth: 03-02-1986 Referring Provider (OT): Ricky Ala, NP   Encounter Date: 06/23/2018  OT End of Session - 06/23/18 1258    Visit Number  2    Number of Visits  16    Date for OT Re-Evaluation  07/20/18    Authorization Type  [employer]    OT Start Time  1100    OT Stop Time  1200    OT Time Calculation (min)  60 min    Activity Tolerance  Patient tolerated treatment well    Behavior During Therapy  [facility] for tasks assessed/performed       Past Medical History:  Diagnosis Date  . ADD (attention deficit disorder)    no meds  . Anxiety   . Constipation   . Depression   . Fatigue   . Fatty liver   . Gallstones   . GERD (gastroesophageal reflux disease)   . History of chicken pox as a child  . History of preterm delivery    2015- 31wks, 2017- 35wks  . HLD (hyperlipidemia)   . HPV (human papilloma virus) infection   . HTN (hypertension)    No meds  . Hx of abnormal cervical Pap smear    ASCUS, LGSIL, CIN I, [place]  . Hypothyroidism    no meds  . Meckel's diverticulitis   . Mood swings   . Obesity   . Preeclampsia    2015 & 2017 pregnancies  . Right ovarian cyst    2.5cm  . Smoker   . Tobacco abuse   . Vitamin deficiency     Past Surgical History:  Procedure Laterality Date  . ACHILLES TENDON SURGERY Bilateral   . BONE MARROW BIOPSY  06/2017  . CESAREAN SECTION N/A 06/09/2014   Procedure: CESAREAN SECTION;  Surgeon: Farrel Gobble. Harrington Challenger, MD;  Location: [facility];  Service: Obstetrics;  Laterality: N/A;  . CESAREAN SECTION N/A 09/22/2015   Procedure: CESAREAN SECTION;  Surgeon: Jerelyn Charles, MD;  Location: [facility];  Service: Obstetrics;  Laterality: N/A;  . CHOLECYSTECTOMY    . CHOLECYSTECTOMY, LAPAROSCOPIC    . DIAGNOSTIC  LAPAROSCOPY     removal of meckels diverticulum  . LAPAROSCOPIC SMALL BOWEL RESECTION N/A 11/02/2017   Procedure: LAPAROSCOPIC REMOVAL OF MECKEL'S DIVERTICULUM ERAS PATHWAY;  Surgeon: Excell Seltzer, MD;  Location: [facility];  Service: General;  Laterality: N/A;  . TUBAL LIGATION    . WISDOM TOOTH EXTRACTION      There were no vitals filed for this visit.  Subjective Assessment - 06/23/18 1258    Currently in Pain?  No/denies        S: "I have not been social at all lately"  O:Eduation given on importance of social participation when reintegrating into community. Further education given on varying types of social participation (emotional, tangible, informational, and social needs), how they can be of use, the barriers, and how to apply them to current problems. Additional information given on community options for socially engaging leisure activities. Pt to choose one area this date that will help improve social participation.  A: Pt presents to group with blunted affect, engaged and participatory throughout entirety of session. Pt sharing that husband, friend, and therapy are her forms of social support. Her barriers are communication and time constraints that  she experiences. Pt mentioning interest in psychology, and would like to take classes at a local community college to fill that interest and increase socialization.   P: OT will continue to follow up on social participation information for increased implementation into daily routine.                    OT Education - 06/23/18 1258    Education Details  education given on ways to increase social participation in community    Person(s) Educated  Patient    Methods  Explanation;Handout    Comprehension  Verbalized understanding       OT Short Term Goals - 06/23/18 1259      OT SHORT TERM GOAL #1   Title  Pt will be education on strategies to improve psychosocial skills needed to participate fully in all daily,  leisure, and work activities    Time  4    Period  Weeks    Status  On-going    Target Date  07/20/18      OT SHORT TERM GOAL #2   Title  Pt will apply psychosocial skills and coping mechanisms to daily activities in order to function independently and reintegrate into community dwelling    Time  4    Period  Weeks    Status  On-going    Target Date  07/20/18      OT SHORT TERM GOAL #3   Title  Pt will choose and/or engage in 1-3 socially engaging leisure activities to improve social participation upon reintegrating into community    Time  4    Period  Weeks    Status  On-going    Target Date  07/20/18      OT SHORT TERM GOAL #4   Title  Pt will explore 1-3 new meaningful roles to engage in upon reintegrating into community    Time  4    Period  Weeks    Status  On-going    Target Date  07/20/18      OT SHORT TERM GOAL #5   Title  Pt will engage in goal setting to improve funcitonal BADL/IADL routine upon reintegrating into community    Period  Weeks    Status  On-going    Target Date  07/20/18               Plan - 06/23/18 1258    Occupational performance deficits (Please refer to evaluation for details):  ADL's;IADL's;Rest and Sleep;Work;Leisure;Social Participation       Patient will benefit from skilled therapeutic intervention in order to improve the following deficits and impairments:  Decreased coping skills, Decreased psychosocial skills, Other (comment)(decreased ability to engage in BADL and reintegrate into community)  Visit Diagnosis: Organic personality disorder  Difficulty coping    Problem List Patient Active Problem List   Diagnosis Date Noted  . PTSD (post-traumatic stress disorder) 05/04/2018  . Intentional benzodiazepine overdose ([place]) 05/01/2018  . Severe recurrent major depression without psychotic features ([place]) 05/01/2018  . Suicide attempt ([place]) 05/01/2018  . GERD (gastroesophageal reflux disease) 02/13/2018  . Fatty liver  02/13/2018  . Lower abdominal pain 02/13/2018  . Weight gain, abnormal 07/28/2017  . Routine general medical examination at a health care facility 05/25/2016  . Lipoma of lower extremity 03/08/2016  . Hyperlipidemia 02/22/2016  . Elevated transaminase level 02/20/2016  . Leukocytosis 02/20/2016  . 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 Essential hypertension 01/07/2016  . Placental abruption 09/23/2015  .  Encounter for biometric screening 09/30/2013  . Screening for lipoid disorders 09/28/2013  . Diabetes mellitus screening 09/28/2013  . Hypothyroid 11/04/2011  . Depression 10/04/2011  . HEADACHE 11/18/2009  . Adjustment disorder with mixed anxiety and depressed mood 11/17/2009  . FATIGUE 10/06/2009  . TOBACCO USE 02/19/2009  . CHICKENPOX, HX OF 02/19/2009  . DEFICIENCY OF OTHER VITAMINS 04/29/2008  . OTHER CONSTIPATION 04/29/2008   Zenovia Jarred, MSOT, OTR/L Behavioral Health OT/ Acute Relief OT PHP Office: [phone]  Zenovia Jarred 06/23/2018, 1:00 PM  [facility] PARTIAL HOSPITALIZATION PROGRAM [place], [place], [address] Phone: [phone]   Fax:  [phone]  Name: Michele Rojas MRN: [id number] Date of Birth: 11-Jun-1986

## 2018-06-26 ENCOUNTER — Encounter (HOSPITAL_COMMUNITY): Payer: Self-pay | Admitting: Occupational Therapy

## 2018-06-26 ENCOUNTER — Other Ambulatory Visit (HOSPITAL_COMMUNITY): Payer: 59 | Admitting: Occupational Therapy

## 2018-06-26 ENCOUNTER — Other Ambulatory Visit (HOSPITAL_COMMUNITY): Payer: 59 | Admitting: Licensed Clinical Social Worker

## 2018-06-26 DIAGNOSIS — F070 Personality change due to known physiological condition: Secondary | ICD-10-CM

## 2018-06-26 DIAGNOSIS — F332 Major depressive disorder, recurrent severe without psychotic features: Secondary | ICD-10-CM

## 2018-06-26 DIAGNOSIS — R4589 Other symptoms and signs involving emotional state: Secondary | ICD-10-CM

## 2018-06-26 NOTE — Therapy (Signed)
[place], [place], [address] Phone: [phone]   Fax:  [phone]  Occupational Therapy Treatment  Patient Details  Name: Michele Rojas MRN: [id number] Date of Birth: 09/07/1985 Referring Provider (OT): Ricky Ala, NP   Encounter Date: 06/26/2018  OT End of Session - 06/26/18 1307    Visit Number  3    Number of Visits  16    Date for OT Re-Evaluation  07/20/18    Authorization Type  [employer]    OT Start Time  1100    OT Stop Time  1200    OT Time Calculation (min)  60 min    Activity Tolerance  Patient tolerated treatment well    Behavior During Therapy  [facility] for tasks assessed/performed       Past Medical History:  Diagnosis Date  . ADD (attention deficit disorder)    no meds  . Anxiety   . Constipation   . Depression   . Fatigue   . Fatty liver   . Gallstones   . GERD (gastroesophageal reflux disease)   . History of chicken pox as a child  . History of preterm delivery    2015- 31wks, 2017- 35wks  . HLD (hyperlipidemia)   . HPV (human papilloma virus) infection   . HTN (hypertension)    No meds  . Hx of abnormal cervical Pap smear    ASCUS, LGSIL, CIN I, [place]  . Hypothyroidism    no meds  . Meckel's diverticulitis   . Mood swings   . Obesity   . Preeclampsia    2015 & 2017 pregnancies  . Right ovarian cyst    2.5cm  . Smoker   . Tobacco abuse   . Vitamin deficiency     Past Surgical History:  Procedure Laterality Date  . ACHILLES TENDON SURGERY Bilateral   . BONE MARROW BIOPSY  06/2017  . CESAREAN SECTION N/A 06/09/2014   Procedure: CESAREAN SECTION;  Surgeon: Farrel Gobble. Harrington Challenger, MD;  Location: [facility];  Service: Obstetrics;  Laterality: N/A;  . CESAREAN SECTION N/A 09/22/2015   Procedure: CESAREAN SECTION;  Surgeon: Jerelyn Charles, MD;  Location: [facility];  Service: Obstetrics;  Laterality: N/A;  . CHOLECYSTECTOMY    . CHOLECYSTECTOMY, LAPAROSCOPIC    . DIAGNOSTIC  LAPAROSCOPY     removal of meckels diverticulum  . LAPAROSCOPIC SMALL BOWEL RESECTION N/A 11/02/2017   Procedure: LAPAROSCOPIC REMOVAL OF MECKEL'S DIVERTICULUM ERAS PATHWAY;  Surgeon: Excell Seltzer, MD;  Location: [facility];  Service: General;  Laterality: N/A;  . TUBAL LIGATION    . WISDOM TOOTH EXTRACTION      There were no vitals filed for this visit.  Subjective Assessment - 06/26/18 1307    Currently in Pain?  No/denies       S: "I need to work on validation with my husband"   O: OT treatment focus on communication skills, with emphasis of session on active listening. Ice breaker activity given with focus of active listening in pairs given at start of session. Pt received handout to guide understanding of definition of active listening and how to improve skills this date. Pt asked to share personal experiences and examples throughout session. Active listening activity completed for pt to watch informative video of a situation in action regarding active listening. Discussion facilitated at end of session.  A: Pt presented to group with blunted affect, engaged and participatory throughout session. Pt engaged in ice breaker, completing  activity successfully and engaging partner to complete successfully. Pt received active listening handout, in understanding of ways to improve current practices. Pt wanting to improve in the skill of validation when speaking to her husband, because she feels as if she never does that and it would be beneficial. Pt engaged in video, stating that she needs to work on this with her husband and children in these various situations.  P: Pt provided with communication skills to implement when reintegrating into community dwelling. OT will continue follow up with communication skills for successful implementation in daily life                  OT Education - 06/26/18 1307    Education Details  education given on communication skills    Person(s)  Educated  Patient    Methods  Explanation;Handout    Comprehension  Verbalized understanding       OT Short Term Goals - 06/23/18 1259      OT SHORT TERM GOAL #1   Title  Pt will be education on strategies to improve psychosocial skills needed to participate fully in all daily, leisure, and work activities    Time  4    Period  Weeks    Status  On-going    Target Date  07/20/18      OT SHORT TERM GOAL #2   Title  Pt will apply psychosocial skills and coping mechanisms to daily activities in order to function independently and reintegrate into community dwelling    Time  4    Period  Weeks    Status  On-going    Target Date  07/20/18      OT SHORT TERM GOAL #3   Title  Pt will choose and/or engage in 1-3 socially engaging leisure activities to improve social participation upon reintegrating into community    Time  4    Period  Weeks    Status  On-going    Target Date  07/20/18      OT SHORT TERM GOAL #4   Title  Pt will explore 1-3 new meaningful roles to engage in upon reintegrating into community    Time  4    Period  Weeks    Status  On-going    Target Date  07/20/18      OT SHORT TERM GOAL #5   Title  Pt will engage in goal setting to improve funcitonal BADL/IADL routine upon reintegrating into community    Period  Weeks    Status  On-going    Target Date  07/20/18               Plan - 06/26/18 1308    Occupational performance deficits (Please refer to evaluation for details):  ADL's;IADL's;Rest and Sleep;Work;Leisure;Social Participation       Patient will benefit from skilled therapeutic intervention in order to improve the following deficits and impairments:  Decreased coping skills, Decreased psychosocial skills, Other (comment)(decreased ability to engage in BADL and reintegrate into community)  Visit Diagnosis: Organic personality disorder  Difficulty coping    Problem List Patient Active Problem List   Diagnosis Date Noted  . PTSD  (post-traumatic stress disorder) 05/04/2018  . Intentional benzodiazepine overdose ([place]) 05/01/2018  . Severe recurrent major depression without psychotic features ([place]) 05/01/2018  . Suicide attempt ([place]) 05/01/2018  . GERD (gastroesophageal reflux disease) 02/13/2018  . Fatty liver 02/13/2018  . Lower abdominal pain 02/13/2018  . Weight gain, abnormal 07/28/2017  .  Routine general medical examination at a health care facility 05/25/2016  . Lipoma of lower extremity 03/08/2016  . Hyperlipidemia 02/22/2016  . Elevated transaminase level 02/20/2016  . Leukocytosis 02/20/2016  . 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 Essential hypertension 01/07/2016  . Placental abruption 09/23/2015  . Encounter for biometric screening 09/30/2013  . Screening for lipoid disorders 09/28/2013  . Diabetes mellitus screening 09/28/2013  . Hypothyroid 11/04/2011  . Depression 10/04/2011  . HEADACHE 11/18/2009  . Adjustment disorder with mixed anxiety and depressed mood 11/17/2009  . FATIGUE 10/06/2009  . TOBACCO USE 02/19/2009  . CHICKENPOX, HX OF 02/19/2009  . DEFICIENCY OF OTHER VITAMINS 04/29/2008  . OTHER CONSTIPATION 04/29/2008   Zenovia Jarred, MSOT, OTR/L Behavioral Health OT/ Acute Relief OT [facility] Office: [phone]  Zenovia Jarred 06/26/2018, 1:11 PM  [facility] HOSPITALIZATION PROGRAM [place], [place], [address] Phone: [phone]   Fax:  [phone]  Name: Michele Rojas MRN: [id number] Date of Birth: 29-Nov-1985

## 2018-06-27 ENCOUNTER — Other Ambulatory Visit (HOSPITAL_COMMUNITY): Payer: 59 | Admitting: Licensed Clinical Social Worker

## 2018-06-27 ENCOUNTER — Encounter (HOSPITAL_COMMUNITY): Payer: Self-pay

## 2018-06-27 ENCOUNTER — Other Ambulatory Visit (HOSPITAL_COMMUNITY): Payer: 59 | Admitting: Occupational Therapy

## 2018-06-27 DIAGNOSIS — F332 Major depressive disorder, recurrent severe without psychotic features: Secondary | ICD-10-CM | POA: Diagnosis not present

## 2018-06-27 DIAGNOSIS — F070 Personality change due to known physiological condition: Secondary | ICD-10-CM

## 2018-06-27 DIAGNOSIS — R4589 Other symptoms and signs involving emotional state: Secondary | ICD-10-CM

## 2018-06-27 NOTE — Therapy (Signed)
[place], [place], [address] Phone: [phone]   Fax:  [phone]  Occupational Therapy Treatment  Patient Details  Name: Michele Rojas MRN: [id number] Date of Birth: 24-May-1986 Referring Provider (OT): Ricky Ala, NP   Encounter Date: 06/27/2018  OT End of Session - 06/27/18 1446    Visit Number  4    Number of Visits  16    Date for OT Re-Evaluation  07/20/18    Authorization Type  [employer]    OT Start Time  1100    OT Stop Time  1200    OT Time Calculation (min)  60 min    Activity Tolerance  Patient tolerated treatment well    Behavior During Therapy  [facility] for tasks assessed/performed       Past Medical History:  Diagnosis Date  . ADD (attention deficit disorder)    no meds  . Anxiety   . Constipation   . Depression   . Fatigue   . Fatty liver   . Gallstones   . GERD (gastroesophageal reflux disease)   . History of chicken pox as a child  . History of preterm delivery    2015- 31wks, 2017- 35wks  . HLD (hyperlipidemia)   . HPV (human papilloma virus) infection   . HTN (hypertension)    No meds  . Hx of abnormal cervical Pap smear    ASCUS, LGSIL, CIN I, [place]  . Hypothyroidism    no meds  . Meckel's diverticulitis   . Mood swings   . Obesity   . Preeclampsia    2015 & 2017 pregnancies  . Right ovarian cyst    2.5cm  . Smoker   . Tobacco abuse   . Vitamin deficiency     Past Surgical History:  Procedure Laterality Date  . ACHILLES TENDON SURGERY Bilateral   . BONE MARROW BIOPSY  06/2017  . CESAREAN SECTION N/A 06/09/2014   Procedure: CESAREAN SECTION;  Surgeon: Farrel Gobble. Harrington Challenger, MD;  Location: [facility];  Service: Obstetrics;  Laterality: N/A;  . CESAREAN SECTION N/A 09/22/2015   Procedure: CESAREAN SECTION;  Surgeon: Jerelyn Charles, MD;  Location: [facility];  Service: Obstetrics;  Laterality: N/A;  . CHOLECYSTECTOMY    . CHOLECYSTECTOMY, LAPAROSCOPIC    . DIAGNOSTIC  LAPAROSCOPY     removal of meckels diverticulum  . LAPAROSCOPIC SMALL BOWEL RESECTION N/A 11/02/2017   Procedure: LAPAROSCOPIC REMOVAL OF MECKEL'S DIVERTICULUM ERAS PATHWAY;  Surgeon: Excell Seltzer, MD;  Location: [facility];  Service: General;  Laterality: N/A;  . TUBAL LIGATION    . WISDOM TOOTH EXTRACTION      There were no vitals filed for this visit.  Subjective Assessment - 06/27/18 1446    Currently in Pain?  No/denies        S: "I struggle more with verbal communication with people that I know"   O: Education given on verbal communication skills to improve social participation in the community. Education given on conversational skills to increase current practices. Interactive communication scenario activity given for pt to participate in group and gain insight with other members. Worksheet given for pt to take notes of appropriate communication with the varying situations. Further education and corrections given when pt verbally explaining interactive scenario. Pt encouraged to share and discuss within group to facilitate further discussions.  A: Pt presents to group with a flat affect, engaged and participatory throughout session. Pt in understanding of communication skill education  this date. Pt shares that she struggles more to maintain conversation with people close to her, knowing there is more at stake. Pt role played scenario of rekindling with a best friend whom which she has drifted apart from. Pt needing min VC's to model scenario appopriately.  P: OT will continue to follow up on communication skills to ensure successful implementation when re-engaging community dwelling.                  OT Education - 06/27/18 1446    Education Details  education given on communication skills    Person(s) Educated  Patient    Methods  Explanation;Handout    Comprehension  Verbalized understanding       OT Short Term Goals - 06/23/18 1259      OT SHORT TERM GOAL  #1   Title  Pt will be education on strategies to improve psychosocial skills needed to participate fully in all daily, leisure, and work activities    Time  4    Period  Weeks    Status  On-going    Target Date  07/20/18      OT SHORT TERM GOAL #2   Title  Pt will apply psychosocial skills and coping mechanisms to daily activities in order to function independently and reintegrate into community dwelling    Time  4    Period  Weeks    Status  On-going    Target Date  07/20/18      OT SHORT TERM GOAL #3   Title  Pt will choose and/or engage in 1-3 socially engaging leisure activities to improve social participation upon reintegrating into community    Time  4    Period  Weeks    Status  On-going    Target Date  07/20/18      OT SHORT TERM GOAL #4   Title  Pt will explore 1-3 new meaningful roles to engage in upon reintegrating into community    Time  4    Period  Weeks    Status  On-going    Target Date  07/20/18      OT SHORT TERM GOAL #5   Title  Pt will engage in goal setting to improve funcitonal BADL/IADL routine upon reintegrating into community    Period  Weeks    Status  On-going    Target Date  07/20/18               Plan - 06/27/18 1447    Occupational performance deficits (Please refer to evaluation for details):  ADL's;IADL's;Rest and Sleep;Work;Leisure;Social Participation       Patient will benefit from skilled therapeutic intervention in order to improve the following deficits and impairments:  Decreased coping skills, Decreased psychosocial skills, Other (comment)(decreased ability to engage in BADL and reintegrate into community)  Visit Diagnosis: Organic personality disorder  Difficulty coping    Problem List Patient Active Problem List   Diagnosis Date Noted  . PTSD (post-traumatic stress disorder) 05/04/2018  . Intentional benzodiazepine overdose ([place]) 05/01/2018  . Severe recurrent major depression without psychotic features ([place])  05/01/2018  . Suicide attempt ([place]) 05/01/2018  . GERD (gastroesophageal reflux disease) 02/13/2018  . Fatty liver 02/13/2018  . Lower abdominal pain 02/13/2018  . Weight gain, abnormal 07/28/2017  . Routine general medical examination at a health care facility 05/25/2016  . Lipoma of lower extremity 03/08/2016  . Hyperlipidemia 02/22/2016  . Elevated transaminase level 02/20/2016  . Leukocytosis 02/20/2016  .  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 Essential hypertension 01/07/2016  . Placental abruption 09/23/2015  . Encounter for biometric screening 09/30/2013  . Screening for lipoid disorders 09/28/2013  . Diabetes mellitus screening 09/28/2013  . Hypothyroid 11/04/2011  . Depression 10/04/2011  . HEADACHE 11/18/2009  . Adjustment disorder with mixed anxiety and depressed mood 11/17/2009  . FATIGUE 10/06/2009  . TOBACCO USE 02/19/2009  . CHICKENPOX, HX OF 02/19/2009  . DEFICIENCY OF OTHER VITAMINS 04/29/2008  . OTHER CONSTIPATION 04/29/2008   Zenovia Jarred, MSOT, OTR/L Behavioral Health OT/ Acute Relief OT [facility] Office: [phone]  Zenovia Jarred 06/27/2018, 2:52 PM  [facility] PARTIAL HOSPITALIZATION PROGRAM [place], [place], [address] Phone: [phone]   Fax:  [phone]  Name: Michele Rojas MRN: [id number] Date of Birth: 1986/02/23

## 2018-06-28 ENCOUNTER — Other Ambulatory Visit (HOSPITAL_COMMUNITY): Payer: 59 | Admitting: Licensed Clinical Social Worker

## 2018-06-28 ENCOUNTER — Encounter (HOSPITAL_COMMUNITY): Payer: Self-pay | Admitting: Family

## 2018-06-28 DIAGNOSIS — F332 Major depressive disorder, recurrent severe without psychotic features: Secondary | ICD-10-CM | POA: Diagnosis not present

## 2018-06-28 MED ORDER — TRAZODONE HCL 100 MG PO TABS: 100.0000 mg | ORAL_TABLET | Freq: Every evening | ORAL | 0 refills | Status: AC | PRN

## 2018-06-28 NOTE — Progress Notes (Signed)
[place] MD/PA/NP OP Progress Note  06/28/2018 4:11 PM Michele Rojas  MRN:  [id number]  Evaluation :Michele Rojas observed sitting in day room interacting with peers.  She is awake alert and oriented x3.  Reports she feels her depression has improved however continues to express concerns with her attention and focus as she reports she was initiated on reports she was recently initiated on Jornay 20 mg  for her reported ADHD symptoms.  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  Reports she is waiting for prior authorization for this medication. Patient continues to be focused on restarting Adderall.   [place] reports feelings of guilt related to her infidelity.  States her husband continues to be supportive doing next time.  Reports "sometimes I do not know how to feel, feels selfish because I feel like my husband is true feelings" states that she is currently doing daily group session on cognitive thinking. continues to deny suicidal or homicidal ideations.  Denies auditory or visual hallucinations.  Patient reports her sleep has been interrupted and is requesting her trazodone to be increased.  Will increase trazodone 50 to 100 mg.  P.o. at bedtime.  Support, encouragement reassurance was provided.  History: Assessment notes Michele Rojas is a 32 y.o. Caucasian female presents with attempted suicide by overdose.  Patient reports recent discharge from [facility] inpatient admission.  Patient reports multiple stressors surrounding her marriage and financial situation.  Patient reports a few years ago she had  affair and her husband found out however they are currently working through their marriage.  Patient reports any little verbal altercations cause her to feel worse about her self. State that morning was the" last straw".  Patient reports feelings of guilt which is why she attempted to overdose on Xanax and Klonopin.  Reports this is her first inpatient admission.  Denies history  of previous attempts or plan. Denies history of self injures behaviors.  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  Patient is requesting to be restarted on Adderall.  Education provided with recent suicidal attempt and initiating controlled substance.  Patient appeared to be agreeable to plan.  Discussed initiating trazodone 50 mg p.o. nightly for insomnia reported symptoms. patient was enrolled in partial psychiatric program on 06/21/18.    Visit Diagnosis:    ICD-10-CM   1. Severe recurrent major depression without psychotic features (HCC) F33.2 traZODone (DESYREL) 100 MG tablet    Past Psychiatric History:   Past Medical History:  Past Medical History:  Diagnosis Date  . ADD (attention deficit disorder)    no meds  . Anxiety   . Constipation   . Depression   . Fatigue   . Fatty liver   . Gallstones   . GERD (gastroesophageal reflux disease)   . History of chicken pox as a child  . History of preterm delivery    2015- 31wks, 2017- 35wks  . HLD (hyperlipidemia)   . HPV (human papilloma virus) infection   . HTN (hypertension)    No meds  . Hx of abnormal cervical Pap smear    ASCUS, LGSIL, CIN I, [place]  . Hypothyroidism    no meds  . Meckel's diverticulitis   . Mood swings   . Obesity   . Preeclampsia    2015 & 2017 pregnancies  . Right ovarian cyst    2.5cm  . Smoker   . Tobacco abuse   . Vitamin deficiency     Past Surgical History:  Procedure Laterality Date  . ACHILLES TENDON SURGERY Bilateral   . BONE MARROW BIOPSY  06/2017  . CESAREAN SECTION N/A 06/09/2014   Procedure: CESAREAN SECTION;  Surgeon: Farrel Gobble. Harrington Challenger, MD;  Location: [facility];  Service: Obstetrics;  Laterality: N/A;  . CESAREAN SECTION N/A 09/22/2015   Procedure: CESAREAN SECTION;  Surgeon: Jerelyn Charles, MD;  Location: [facility];  Service: Obstetrics;  Laterality: N/A;  . CHOLECYSTECTOMY    . CHOLECYSTECTOMY, LAPAROSCOPIC    . DIAGNOSTIC LAPAROSCOPY     removal of meckels diverticulum  .  LAPAROSCOPIC SMALL BOWEL RESECTION N/A 11/02/2017   Procedure: LAPAROSCOPIC REMOVAL OF MECKEL'S DIVERTICULUM ERAS PATHWAY;  Surgeon: Excell Seltzer, MD;  Location: [facility];  Service: General;  Laterality: N/A;  . TUBAL LIGATION    . WISDOM TOOTH EXTRACTION      Family Psychiatric History:   Family History:  Family History  Problem Relation Age of Onset  . Hypertension Father   . Hyperlipidemia Father   . Prostate cancer Father   . Aneurysm Maternal Grandfather   . Depression Mother        after a car accident  . Colon polyps Mother   . Other Mother        ? CAD /heart disease  . Depression Brother        major depression  . Graves' disease Paternal Uncle   . Alzheimer's disease Paternal Grandmother   . Alzheimer's disease Paternal Grandfather   . Celiac disease Cousin        maternal cousin    Social History:  Social History   Socioeconomic History  . Marital status: Married    Spouse name: Not on file  . Number of children: 2  . Years of education: Not on file  . Highest education level: Not on file  Occupational History  . Occupation: [employer]  Social Needs  . Financial resource strain: Not very hard  . Food insecurity:    Worry: Sometimes true    Inability: Sometimes true  . Transportation needs:    Medical: No    Non-medical: No  Tobacco Use  . Smoking status: Current Every Day Smoker    Packs/day: 1.00    Years: 10.00    Pack years: 10.00    Types: Cigarettes  . Smokeless tobacco: Never Used  . Tobacco comment: Contemplating trying Nicotine packets to help  Substance and Sexual Activity  . Alcohol use: Yes    Alcohol/week: 2.0 standard drinks    Types: 2 Cans of beer per week    Comment: rare  . Drug use: No  . Sexual activity: Yes    Partners: Male    Birth control/protection: Surgical  Lifestyle  . Physical activity:    Days per week: 0 days    Minutes per session: 0 min  . Stress: Rather much  Relationships  . Social connections:     Talks on phone: More than three times a week    Gets together: Three times a week    Attends religious service: Never    Active member of club or organization: No    Attends meetings of clubs or organizations: Never    Relationship status: Married  Other Topics Concern  . Not on file  Social History Narrative   GYN- Dr. 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 Amoxicillin Other (See Comments)    REACTION: yeast infection in mouth Has patient had a PCN reaction causing immediate rash, facial/tongue/throat swelling, SOB or lightheadedness with hypotension: No Has patient had a PCN reaction causing severe rash involving mucus membranes or skin necrosis: No Has patient had a PCN reaction that required hospitalization no Has patient had a PCN reaction occurring within the last 10 years: Yes If all of the above answers are "NO", then may proceed with Cephalosporin use.    Metabolic Disorder Labs: Lab Results  Component Value Date   HGBA1C 5.5 05/04/2018   MPG 111.15 05/04/2018   MPG 111 08/12/2016   Lab Results  Component Value Date   PROLACTIN 6.4 07/13/2016   Lab Results  Component Value Date   CHOL 211 (H) 05/04/2018   TRIG 376 (H) 05/04/2018   HDL 29 (L) 05/04/2018   CHOLHDL 7.3 05/04/2018   VLDL 75 (H) 05/04/2018   LDLCALC 107 (H) 05/04/2018   Lab Results  Component Value Date   TSH 7.49 (H) 06/20/2018   TSH 6.542 (H) 05/04/2018    Therapeutic Level Labs: No results found for: LITHIUM No results found for: VALPROATE No components found for:  CBMZ  Current Medications: Current Outpatient Medications  Medication Sig Dispense Refill  . buPROPion (WELLBUTRIN XL) 300 MG 24 hr tablet Take 1 tablet (300 mg total) by mouth daily. 30 tablet 0  . hydrOXYzine (ATARAX/VISTARIL) 50 MG tablet Take 1 tablet (50 mg total) by mouth 2  (two) times daily as needed for anxiety. 60 tablet 0  . levothyroxine (SYNTHROID, LEVOTHROID) 50 MCG tablet Take 1 tablet (50 mcg total) by mouth daily before breakfast. 30 tablet 11  . ranitidine (ZANTAC) 150 MG capsule Take 150 mg by mouth daily.    . traZODone (DESYREL) 100 MG tablet Take 1 tablet (100 mg total) by mouth at bedtime as needed for sleep. 60 tablet 0   No current facility-administered medications for this visit.      Musculoskeletal: Strength & Muscle Tone: within normal limits Gait & Station: normal Patient leans: N/A  Psychiatric Specialty Exam: ROS  There were no vitals taken for this visit.There is no height or weight on file to calculate BMI.  General Appearance: Casual and Guarded  Eye Contact:  Fair  Speech:  Clear and Coherent  Volume:  Normal  Mood:  Anxious and Depressed  Affect:  Congruent  Thought Process:  Coherent  Orientation:  Full (Time, Place, and Person)  Thought Content: Hallucinations: None   Suicidal Thoughts:  No  Homicidal Thoughts:  No  Memory:  Immediate;   Fair Recent;   Fair Remote;   Fair  Judgement:  Fair  Insight:  Fair  Psychomotor Activity:  Normal  Concentration:  Concentration: Fair  Recall:  [employer] of Knowledge: Fair  Language: Fair  Akathisia:  No  Handed:  Right  AIMS (if indicated):   Assets:  Communication Skills Desire for Improvement Resilience Social Support  ADL's:  Intact  Cognition: WNL  Sleep:  Fair   Screenings: AUDIT     Admission (Discharged) from 05/03/2018 in [place]  Alcohol Use Disorder Identification Test Final Score (AUDIT)  6    GAD-7     Counselor from 06/21/2018 in [facility]  Total GAD-7 Score  15    PHQ2-9     Counselor from 06/21/2018 in [facility] Office Visit from  08/12/2016 in [facility] MD EVALUATION AND MANAGEMENT from 07/08/2015 in  [facility] +14 ALL from 06/18/2015 in [facility] [place]  PHQ-2 Total Score  5  0  0  0  PHQ-9 Total Score  19  -  -  -       Assessment and Plan:   Continue partial hospitalization program Medication management  Increased Trazodone  50 mg to 100 mg po QHS  Continue Vistaril 25 mg p.o. twice daily  Continue Wellbutrin 3 mg p.o.   Treatment plan was reviewed and agreed upon by NP T.Bobby Rumpf and patient Valera Vallas need for continued group services   Derrill [place], NP 06/28/2018, 4:11 PM

## 2018-06-28 NOTE — Progress Notes (Signed)
GROUP NOTE - spiritual care group 06/28/2018 11:00 - 12:00 ?Facilitated by Simone Curia, MDiv, BCC.   Group focused on topic of strength.  Group members reflected on what thoughts and feelings emerge when they hear this topic. They then engaged in facilitated dialog around how strength is present in their lives. This dialog focused on representing what strength had been to them in their lives (images and patterns given) and what they saw as helpful in their life now (what they needed / wanted). ?  Activity drew on narrative framework  Michele Rojas was present throughout group.  Attentive to group conversation.   Engaged voluntarily early in group process.  Expressed strength as disconnected from emotions.  Explored a sense of strength looking like being rational and logical rather than emotional.  In reflecting on this, explored vulnerability of expressing emotions with others.   Related that she fears judgment, but will privately express emotions.  Stated she is unsure where this narrative comes from in her life - described that her parents were affirming.

## 2018-06-29 ENCOUNTER — Other Ambulatory Visit (HOSPITAL_COMMUNITY): Payer: 59 | Admitting: Professional

## 2018-06-29 ENCOUNTER — Other Ambulatory Visit (HOSPITAL_COMMUNITY): Payer: 59

## 2018-06-29 ENCOUNTER — Encounter: Payer: Self-pay | Admitting: Family Medicine

## 2018-06-29 ENCOUNTER — Encounter (HOSPITAL_COMMUNITY): Payer: Self-pay | Admitting: Family

## 2018-06-29 ENCOUNTER — Ambulatory Visit: Payer: Managed Care, Other (non HMO) | Admitting: Family Medicine

## 2018-06-29 VITALS — BP 130/80 | HR 93 | Temp 98.6°F | Ht 67.0 in | Wt 204.2 lb

## 2018-06-29 DIAGNOSIS — R059 Cough, unspecified: Secondary | ICD-10-CM

## 2018-06-29 DIAGNOSIS — J208 Acute bronchitis due to other specified organisms: Secondary | ICD-10-CM

## 2018-06-29 DIAGNOSIS — R05 Cough: Secondary | ICD-10-CM

## 2018-06-29 DIAGNOSIS — F17210 Nicotine dependence, cigarettes, uncomplicated: Secondary | ICD-10-CM | POA: Diagnosis not present

## 2018-06-29 DIAGNOSIS — Z716 Tobacco abuse counseling: Secondary | ICD-10-CM

## 2018-06-29 DIAGNOSIS — F332 Major depressive disorder, recurrent severe without psychotic features: Secondary | ICD-10-CM

## 2018-06-29 MED ORDER — METHYLPREDNISOLONE 4 MG PO TBPK: ORAL_TABLET | ORAL | 0 refills | Status: DC

## 2018-06-29 MED ORDER — BENZONATATE 100 MG PO CAPS: 100.0000 mg | ORAL_CAPSULE | Freq: Three times a day (TID) | ORAL | 1 refills | Status: DC | PRN

## 2018-06-29 MED ORDER — ALBUTEROL SULFATE HFA 108 (90 BASE) MCG/ACT IN AERS: 2.0000 | INHALATION_SPRAY | Freq: Four times a day (QID) | RESPIRATORY_TRACT | 1 refills | Status: DC | PRN

## 2018-06-29 NOTE — Psych (Signed)
   [facility] BH PHP THERAPIST PROGRESS NOTE  Michele Rojas [id number]  Session Time: 9:00 - 11:00  Participation Level: Active  Behavioral Response: CasualAlertDepressed  Type of Therapy: Group Therapy  Treatment Goals addressed: Coping  Interventions: CBT, DBT, Solution Focused, Supportive and Reframing  Summary: Clinician led check-in regarding current stressors and situation, and review of patient completed daily inventory. Clinician utilized active listening and empathetic response and validated patient emotions. Clinician facilitated processing group on pertinent issues.    Therapist Response: Michele Rojas is a 32 y.o. female who presents with depression symptoms. Patient arrived within time allowed and reports that she is feeling "off." Pt states passive SI and blames not having proper medication for thoughts as the reason. Pt states not knowing how she managed the thoughts. Patient rates her mood at a 3 on a scale of 1-10 with 10 being great. Pt states that she has "a lot of anxiety" and that she went to the doctor and she has to start taking thyroid medication which makes her uneasy.  Patient reports struggling with sleep. Pt will meet with provider about medications today.  Patient engaged in discussion.       Session Time: 11:00 -12:15  Participation Level: Active  Behavioral Response: CasualAlertDepressed  Type of Therapy: Group Therapy, psychotherapy  Treatment Goals addressed: Coping  Interventions: Strengths based, reframing, Supportive,   Summary:  Spiritual Care group  Therapist Response: Patient engaged in group. See chaplain note.         Session Time: 12:15 - 1:00  Participation Level: Active  Behavioral Response: CasualAlertDepressed  Type of Therapy: Group Therapy  Treatment Goals addressed: Coping  Interventions: Systems analyst, Supportive  Summary:  Reflection Group: Patients encouraged to practice skills and  interpersonal techniques or work on mindfulness and relaxation techniques. The importance of self-care and making skills part of a routine to increase usage were stressed   Therapist Response: Patient engaged and participated appropriately.       Session Time: 1:00- 2:00  Participation Level: Active  Behavioral Response: CasualAlertAnxious and Depressed  Type of Therapy: Group Therapy, Psychoeducation; Psychotherapy  Treatment Goals addressed: Coping  Interventions: CBT; Solution focused; Supportive; Reframing  Summary: 12:45 - 1:50: Cln continued topic of communication. Cln introduced "I" Statements and how to formulate them as well as common pitfalls and how to avoid them. 1:50 -2:00 Clinician led check-out. Clinician assessed for immediate needs, medication compliance and efficacy, and safety concerns   Therapist Response: Patient engaged in group. Pt reports understanding of "I" Statements and successfully formulated her own in practice.   At [place], patient rates her mood at a 3 on a scale of 1-10 with 10 being great. Patient reports no afternoon plans. Patient demonstrates some progress by participating in first group session. Patient denies SI/HI/self-harm thoughts at the end of group.     Suicidal/Homicidal: Nowithout intent/plan   Plan: Pt will continue in PHP while working to decrease depression symptoms and increase ability to manage symptoms in a healthy manner.    Diagnosis: Severe recurrent major depression without psychotic features ([place]) [F33.2]    1. Severe recurrent major depression without psychotic features [facility])       Lorin Glass, LCSW 06/29/2018

## 2018-06-29 NOTE — Progress Notes (Signed)
Subjective:    Patient ID: Michele Rojas, female    DOB: April 23, 1986, 32 y.o.   MRN: [id number]  HPI   Patient was in the clinic complaining of cough with some yellow mucus that began about a week ago.  Patient has been using DayQuil and NyQuil with some effect in helping symptoms to be better.  Patient is a everyday smoker, smokes about 1 pack/day.  Patient states cough is worse at night, will also hear some wheezing sounds.  Denies fever or chills.  Denies nausea, vomiting or diarrhea.  Patient Active Problem List   Diagnosis Date Noted  . PTSD (post-traumatic stress disorder) 05/04/2018  . Intentional benzodiazepine overdose ([place]) 05/01/2018  . Severe recurrent major depression without psychotic features ([place]) 05/01/2018  . Suicide attempt ([place]) 05/01/2018  . GERD (gastroesophageal reflux disease) 02/13/2018  . Fatty liver 02/13/2018  . Lower abdominal pain 02/13/2018  . Weight gain, abnormal 07/28/2017  . Routine general medical examination at a health care facility 05/25/2016  . Lipoma of lower extremity 03/08/2016  . Hyperlipidemia 02/22/2016  . Elevated transaminase level 02/20/2016  . Leukocytosis 02/20/2016  . 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 Essential hypertension 01/07/2016  . Placental abruption 09/23/2015  . Encounter for biometric screening 09/30/2013  . Screening for lipoid disorders 09/28/2013  . Diabetes mellitus screening 09/28/2013  . Hypothyroid 11/04/2011  . Depression 10/04/2011  . HEADACHE 11/18/2009  . Adjustment disorder with mixed anxiety and depressed mood 11/17/2009  . FATIGUE 10/06/2009  . TOBACCO USE 02/19/2009  . CHICKENPOX, HX OF 02/19/2009  . DEFICIENCY OF OTHER VITAMINS 04/29/2008  . OTHER CONSTIPATION 04/29/2008   Social History   Tobacco Use  . Smoking status: Current Every Day Smoker    Packs/day: 1.00    Years: 10.00    Pack years: 10.00    Types: Cigarettes  . Smokeless tobacco: Never Used  . Tobacco comment:  Contemplating trying Nicotine packets to help  Substance Use Topics  . Alcohol use: Yes    Alcohol/week: 2.0 standard drinks    Types: 2 Cans of beer per week    Comment: rare   Review of Systems  Constitutional: Negative for chills, fatigue and fever.  HENT: Negative for congestion, ear pain, sinus pain and sore throat.   Eyes: Negative.   Respiratory: +cough, shortness of breath and wheezing.   Cardiovascular: Negative for chest pain, palpitations and leg swelling.  Gastrointestinal: Negative for abdominal pain, diarrhea, nausea and vomiting.  Genitourinary: Negative for dysuria, frequency and urgency.  Musculoskeletal: Negative for arthralgias and myalgias.  Skin: Negative for color change, pallor and rash.  Neurological: Negative for syncope, light-headedness and headaches.  Psychiatric/Behavioral: The patient is not nervous/anxious.       Objective:   Physical Exam  Constitutional: She is oriented to person, place, and time. She appears well-nourished. No distress.  HENT:  Head: Normocephalic and atraumatic.  Eyes: Conjunctivae and EOM are normal. No scleral icterus.  Neck: Neck supple. No tracheal deviation present.  Cardiovascular: Normal rate and regular rhythm.  Pulmonary/Chest: Effort normal. She has wheezes (bilat upper lobes).  Wet sounding cough  Musculoskeletal: She exhibits no edema.  Neurological: She is alert and oriented to person, place, and time. No cranial nerve deficit.  Skin: Skin is warm and dry. She is not diaphoretic. No pallor.  Psychiatric: She has a normal mood and affect. Her behavior is normal.  Nursing note and vitals reviewed.     Vitals:   06/29/18 1521  BP:  130/80  Pulse: 93  Temp: 98.6 F (37 C)  SpO2: 98%   Assessment & Plan:    Viral bronchitis, cough- patient will take prednisone burst to reduce inflammation, and will use albuterol 2 puffs at least 2 times per day for next 5 days, then use just as needed for shortness of breath  or wheezing.  Patient will use Tessalon Perles to calm cough.  Advised to increase fluids, rest, do good handwashing.  Cigarette smoker/smoking cessation counseling-patient encouraged to wean down on her smoking in hopes of fully quitting.  Patient states she has already started to wean herself down.  Patient understands the risks of continued smoking including more susceptibility to illness, development of cancers, increased risks for vascular disease.  Greater than 3 minutes spent discussing smoking cessation.  Keep regularly scheduled follow-up with PCP as planned.  Return to clinic sooner if any issues arise or if current symptoms persist or worsen.  Patient aware that if her cough continues for longer than 2 weeks she should return to clinic for chest x-ray.

## 2018-06-30 ENCOUNTER — Other Ambulatory Visit (HOSPITAL_COMMUNITY): Payer: 59 | Admitting: Licensed Clinical Social Worker

## 2018-06-30 ENCOUNTER — Other Ambulatory Visit (HOSPITAL_COMMUNITY): Payer: 59 | Admitting: Occupational Therapy

## 2018-06-30 ENCOUNTER — Encounter (HOSPITAL_COMMUNITY): Payer: Self-pay | Admitting: Occupational Therapy

## 2018-06-30 ENCOUNTER — Encounter (HOSPITAL_COMMUNITY): Payer: Self-pay

## 2018-06-30 ENCOUNTER — Encounter: Payer: Self-pay | Admitting: Family Medicine

## 2018-06-30 VITALS — BP 116/74 | HR 100 | Ht 67.0 in | Wt 205.0 lb

## 2018-06-30 DIAGNOSIS — R4589 Other symptoms and signs involving emotional state: Secondary | ICD-10-CM

## 2018-06-30 DIAGNOSIS — F332 Major depressive disorder, recurrent severe without psychotic features: Secondary | ICD-10-CM

## 2018-06-30 DIAGNOSIS — F070 Personality change due to known physiological condition: Secondary | ICD-10-CM

## 2018-06-30 NOTE — Psych (Signed)
   [facility] PHP THERAPIST PROGRESS NOTE  Michele Rojas [id number]  Session Time: 9:00 - 11:00  Participation Level: Active  Behavioral Response: CasualAlertDepressed  Type of Therapy: Group Therapy  Treatment Goals addressed: Coping  Interventions: CBT, DBT, Solution Focused, Supportive and Reframing  Summary: Clinician led check-in regarding current stressors and situation, and review of patient completed daily inventory. Clinician utilized active listening and empathetic response and validated patient emotions. Clinician facilitated processing group on pertinent issues.    Therapist Response: Michele Rojas is a 32 y.o. female who presents with depression symptoms. Patient arrived within time allowed and reports that she is feeling ill.  Pt. states that she thinks she caught her children's cold. Patient rates her mood at a 5 on a scale of 1-10 with 10 being great. Pt reports that she had a bath when she got home, and hung out and watched T.V. Patient reports having a few alcoholic drinks last night and has awareness that this is a negative coping skill that she is relying on. Patient continues to struggle with addressing conflicts with husband and issues with husband is a large factor in her mood fluctuations. Patient engaged in discussion.        Session Time: 11:00 -12:00   Participation Level: Active   Behavioral Response: CasualAlertDepressed   Type of Therapy: Group Therapy, OT   Treatment Goals addressed: Coping   Interventions: Psychosocial skills training, Supportive,    Summary:  Occupational Therapy group   Therapist Response: Patient engaged in group. See OT note.          Session Time: 12:15 - 1:00  Participation Level: Active  Behavioral Response: CasualAlertDepressed  Type of Therapy: Group Therapy  Treatment Goals addressed: Coping  Interventions: Systems analyst, Supportive  Summary:  Reflection Group: Patients encouraged  to practice skills and interpersonal techniques or work on mindfulness and relaxation techniques. The importance of self-care and making skills part of a routine to increase usage were stressed   Therapist Response: Patient engaged and participated appropriately.       Session Time: 1:00- 2:00  Participation Level: Active  Behavioral Response: CasualAlertAnxious and Depressed  Type of Therapy: Group Therapy, Psychoeducation; Psychotherapy  Treatment Goals addressed: Coping  Interventions: CBT; Solution focused; Supportive; Reframing  Summary: 12:45 - 1:50: Clinician introduced topic of distress tolerance. Clinician educated group on what the skills are and their purpose. Cln introduced STOP and TIPP skill. Group members discussed how to incorporate this into their lives. 1:50 -2:00 Clinician led check-out. Clinician assessed for immediate needs, medication compliance and efficacy, and safety concerns   Therapist Response: Patient engaged activity and discussion. Pt reports this will be helpful when she is ruminating.   At [place], patient rates her mood at a 6 on a scale of 1-10 with 10 being great. Patient reports afternoon plans to nap, pick up kids and watch T.V. Patient demonstrates some progress as evidenced by increased insight into ways self esteem effects her ability to communicate. Patient denies SI/HI/self-harm thoughts at the end of group.      Suicidal/Homicidal: Nowithout intent/plan   Plan: Pt will continue in PHP while working to decrease depression symptoms and increase ability to manage symptoms in a healthy manner.    Diagnosis: Severe recurrent major depression without psychotic features ([place]) [F33.2]    1. Severe recurrent major depression without psychotic features [facility]       Lorin Glass, LCSW 06/30/2018

## 2018-06-30 NOTE — Therapy (Signed)
[place], [place], [address] Phone: [phone]   Fax:  [phone]  Occupational Therapy Treatment  Patient Details  Name: Michele Rojas MRN: [id number] Date of Birth: 06-Oct-1985 Referring Provider (OT): Ricky Ala, NP   Encounter Date: 06/30/2018  OT End of Session - 06/30/18 1230    Visit Number  5    Number of Visits  16    Date for OT Re-Evaluation  07/20/18    Authorization Type  [employer]    OT Start Time  1100    OT Stop Time  1200    OT Time Calculation (min)  60 min    Activity Tolerance  Patient tolerated treatment well    Behavior During Therapy  [facility] for tasks assessed/performed       Past Medical History:  Diagnosis Date  . ADD (attention deficit disorder)    no meds  . Anxiety   . Constipation   . Depression   . Fatigue   . Fatty liver   . Gallstones   . GERD (gastroesophageal reflux disease)   . History of chicken pox as a child  . History of preterm delivery    2015- 31wks, 2017- 35wks  . HLD (hyperlipidemia)   . HPV (human papilloma virus) infection   . HTN (hypertension)    No meds  . Hx of abnormal cervical Pap smear    ASCUS, LGSIL, CIN I, Colpo  . Hypothyroidism    no meds  . Meckel's diverticulitis   . Mood swings   . Obesity   . Preeclampsia    2015 & 2017 pregnancies  . Right ovarian cyst    2.5cm  . Smoker   . Tobacco abuse   . Vitamin deficiency     Past Surgical History:  Procedure Laterality Date  . ACHILLES TENDON SURGERY Bilateral   . BONE MARROW BIOPSY  06/2017  . CESAREAN SECTION N/A 06/09/2014   Procedure: CESAREAN SECTION;  Surgeon: Farrel Gobble. Harrington Challenger, MD;  Location: [facility];  Service: Obstetrics;  Laterality: N/A;  . CESAREAN SECTION N/A 09/22/2015   Procedure: CESAREAN SECTION;  Surgeon: Jerelyn Charles, MD;  Location: [facility];  Service: Obstetrics;  Laterality: N/A;  . CHOLECYSTECTOMY    . CHOLECYSTECTOMY, LAPAROSCOPIC    . DIAGNOSTIC  LAPAROSCOPY     removal of meckels diverticulum  . LAPAROSCOPIC SMALL BOWEL RESECTION N/A 11/02/2017   Procedure: LAPAROSCOPIC REMOVAL OF MECKEL'S DIVERTICULUM ERAS PATHWAY;  Surgeon: Excell Seltzer, MD;  Location: [facility];  Service: General;  Laterality: N/A;  . TUBAL LIGATION    . WISDOM TOOTH EXTRACTION      There were no vitals filed for this visit.  Subjective Assessment - 06/30/18 1230    Currently in Pain?  No/denies        S: "I have limited yoga experience, I had a hard time focusing"  O: Education and demonstration given on using yoga as a psychosocial coping strategy throughout daily routines. Yoga video used as therapeutic aide for participants to follow and incorporate into daily routine. Possible activity restrictions addressed for safety of pt in yoga practice. Opportunities for practice of yoga on the floor with a mat or in a chair per preference. Further education given on how to access various styles and types of yoga for different situations (exercise, stretch, relaxation, etc.), pt in understanding. Handout given for pt to make exercise goals to help continue to build routine.  A: Pt presents  with blunted affect, was actively engaged in treatment this date. Pt completed yoga in chair to actively engage in yoga video this date. No pain was noted at baseline. Pt in understanding of how to acquire different yoga practices for appropriate situation and routine. Pt mentions enjoying yoga, and filled out handout to continue yoga practice and zumba to get involved.  P: Pt provided with education and demonstration of yoga and using positive affirmations during meditation as an effective coping strategy. OT will continue to follow up on skills learned this date for successful implementation into daily life.                    OT Education - 06/30/18 1230    Education Details  education given on using yoga/exercise as coping mechanism    Person(s) Educated   Patient    Methods  Explanation;Handout    Comprehension  Verbalized understanding       OT Short Term Goals - 06/23/18 1259      OT SHORT TERM GOAL #1   Title  Pt will be education on strategies to improve psychosocial skills needed to participate fully in all daily, leisure, and work activities    Time  4    Period  Weeks    Status  On-going    Target Date  07/20/18      OT SHORT TERM GOAL #2   Title  Pt will apply psychosocial skills and coping mechanisms to daily activities in order to function independently and reintegrate into community dwelling    Time  4    Period  Weeks    Status  On-going    Target Date  07/20/18      OT SHORT TERM GOAL #3   Title  Pt will choose and/or engage in 1-3 socially engaging leisure activities to improve social participation upon reintegrating into community    Time  4    Period  Weeks    Status  On-going    Target Date  07/20/18      OT SHORT TERM GOAL #4   Title  Pt will explore 1-3 new meaningful roles to engage in upon reintegrating into community    Time  4    Period  Weeks    Status  On-going    Target Date  07/20/18      OT SHORT TERM GOAL #5   Title  Pt will engage in goal setting to improve funcitonal BADL/IADL routine upon reintegrating into community    Period  Weeks    Status  On-going    Target Date  07/20/18               Plan - 06/30/18 1230    Occupational performance deficits (Please refer to evaluation for details):  ADL's;IADL's;Rest and Sleep;Work;Leisure;Social Participation       Patient will benefit from skilled therapeutic intervention in order to improve the following deficits and impairments:  Decreased coping skills, Decreased psychosocial skills, Other (comment)(decreased ability to engage in BADL and reintegrate into community)  Visit Diagnosis: Organic personality disorder  Difficulty coping    Problem List Patient Active Problem List   Diagnosis Date Noted  . PTSD (post-traumatic  stress disorder) 05/04/2018  . Intentional benzodiazepine overdose ([place]) 05/01/2018  . Severe recurrent major depression without psychotic features ([place]) 05/01/2018  . Suicide attempt ([place]) 05/01/2018  . GERD (gastroesophageal reflux disease) 02/13/2018  . Fatty liver 02/13/2018  . Lower abdominal pain 02/13/2018  .  Weight gain, abnormal 07/28/2017  . Routine general medical examination at a health care facility 05/25/2016  . Lipoma of lower extremity 03/08/2016  . Hyperlipidemia 02/22/2016  . Elevated transaminase level 02/20/2016  . Leukocytosis 02/20/2016  . 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 Essential hypertension 01/07/2016  . Placental abruption 09/23/2015  . Encounter for biometric screening 09/30/2013  . Screening for lipoid disorders 09/28/2013  . Diabetes mellitus screening 09/28/2013  . Hypothyroid 11/04/2011  . Depression 10/04/2011  . HEADACHE 11/18/2009  . Adjustment disorder with mixed anxiety and depressed mood 11/17/2009  . FATIGUE 10/06/2009  . TOBACCO USE 02/19/2009  . CHICKENPOX, HX OF 02/19/2009  . DEFICIENCY OF OTHER VITAMINS 04/29/2008  . OTHER CONSTIPATION 04/29/2008   Zenovia Jarred, MSOT, OTR/L Behavioral Health OT/ Acute Relief OT PHP Office: [phone]  Zenovia Jarred 06/30/2018, 12:32 PM  [place], [place], [address] Phone: [phone]   Fax:  [phone]  Name: Michele Rojas MRN: [id number] Date of Birth: 07-23-1986

## 2018-06-30 NOTE — Psych (Signed)
   [facility] BH PHP THERAPIST PROGRESS NOTE  [name] [id number]  Session Time: 9:00 - 11:00  Participation Level: Active  Behavioral Response: CasualAlertDepressed  Type of Therapy: Group Therapy  Treatment Goals addressed: Coping  Interventions: CBT, DBT, Solution Focused, Supportive and Reframing  Summary: Clinician led check-in regarding current stressors and situation, and review of patient completed daily inventory. Clinician utilized active listening and empathetic response and validated patient emotions. Clinician facilitated processing group on pertinent issues.    Therapist Response: Michele Rojas is a 32 y.o. female who presents with depression symptoms. Patient arrived within time allowed and reports that she is "not feeling well". Patient rates her mood at a 5 on a scale of 1-10 with 10 being great. Pt reports that she is still sick from her cold and didn't sleep well. Pt states that she had a "pity party" over discussion with her husband. Pt states that she watched T.V. and removed herself from discussion. Patient continues to struggle with assertiveness. Patient engaged in discussion.  Session Time: 10:15 -11:00  Participation Level: Active  Behavioral Response: CasualAlertDepressed  Type of Therapy: Group Therapy, psychoeducation, psychotherapy  Treatment Goals addressed: Coping  Interventions: CBT, DBT, Solution Focused, Supportive and Reframing  Summary:  Cln continued discussion on distress tolerance. Cln introduced ACCEPTS skills. Group discussed "A" activities and "C" contributions and how they can utilize them.       Therapist Response: Patient engaged and participated in discussion.       Session Time: 11:00 -12:00   Participation Level: Active   Behavioral Response: CasualAlertDepressed   Type of Therapy: Group Therapy, OT   Treatment Goals addressed: Coping   Interventions: Psychosocial skills training, Supportive,     Summary:  Occupational Therapy group   Therapist Response: Patient engaged in group. See OT note.       Session Time: 12:00- 1:00  Participation Level: Active  Behavioral Response: CasualAlertAnxious and Depressed  Type of Therapy: Group Therapy, Psychoeducation; Psychotherapy  Treatment Goals addressed: Coping  Interventions: CBT; Solution focused; Supportive; Reframing  Summary: 12:00 - 12:50: Clinician continued topic of distress tolerance skills and ACCEPTS. Group learned Comparisons, different Emotion, Pushing away, Thoughts, and Sensation. Pt's discussed how they can utilize these skills. 12:50 -1:00 Clinician led check-out. Clinician assessed for immediate needs, medication compliance and efficacy, and safety concerns   Therapist Response: Patient engaged activity and discussion.  [name], patient rates her mood at a 6 on a scale of 1-10 with 10 being great. Patient reports weekend plans to clean church. Patient demonstrates some progress as evidenced by utilizing boundary skills.    Suicidal/Homicidal: Nowithout intent/plan   Plan: Pt will continue in PHP while working to decrease depression symptoms and increase ability to manage symptoms in a healthy manner.    Diagnosis: Severe recurrent major depression without psychotic features ([place]) [F33.2]    1. Severe recurrent major depression without psychotic features ([place])       Ahjanae Cassel J Garner Dullea, LPCA, LCASA 06/30/2018
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
## 2018-07-03 ENCOUNTER — Other Ambulatory Visit (HOSPITAL_COMMUNITY): Payer: 59 | Admitting: Licensed Clinical Social Worker

## 2018-07-03 ENCOUNTER — Other Ambulatory Visit (HOSPITAL_COMMUNITY): Payer: 59 | Admitting: Occupational Therapy

## 2018-07-03 ENCOUNTER — Encounter (HOSPITAL_COMMUNITY): Payer: Self-pay | Admitting: Occupational Therapy

## 2018-07-03 DIAGNOSIS — F332 Major depressive disorder, recurrent severe without psychotic features: Secondary | ICD-10-CM | POA: Diagnosis not present

## 2018-07-03 DIAGNOSIS — R4589 Other symptoms and signs involving emotional state: Secondary | ICD-10-CM

## 2018-07-03 DIAGNOSIS — F070 Personality change due to known physiological condition: Secondary | ICD-10-CM

## 2018-07-03 NOTE — Therapy (Signed)
[place], [place], [address] Phone: [phone]   Fax:  [phone]  Occupational Therapy Treatment  Patient Details  Name: Michele Rojas MRN: [id number] Date of Birth: [birth year] Referring Provider (OT): Ricky Ala, NP   Encounter Date: 07/03/2018  OT End of Session - 07/03/18 1305    Visit Number  6    Number of Visits  16    Date for OT Re-Evaluation  07/20/18    Authorization Type  [employer]    OT Start Time  1100    OT Stop Time  1200    OT Time Calculation (min)  60 min    Activity Tolerance  Patient tolerated treatment well    Behavior During Therapy  [facility] for tasks assessed/performed       Past Medical History:  Diagnosis Date  . ADD (attention deficit disorder)    no meds  . Anxiety   . Constipation   . Depression   . Fatigue   . Fatty liver   . Gallstones   . GERD (gastroesophageal reflux disease)   . History of chicken pox as a child  . History of preterm delivery    2015- 31wks, 2017- 35wks  . HLD (hyperlipidemia)   . HPV (human papilloma virus) infection   . HTN (hypertension)    No meds  . Hx of abnormal cervical Pap smear    ASCUS, LGSIL, CIN I, [place]  . Hypothyroidism    no meds  . Meckel's diverticulitis   . Mood swings   . Obesity   . Preeclampsia    2015 & 2017 pregnancies  . Right ovarian cyst    2.5cm  . Smoker   . Tobacco abuse   . Vitamin deficiency     Past Surgical History:  Procedure Laterality Date  . ACHILLES TENDON SURGERY Bilateral   . BONE MARROW BIOPSY  06/2017  . CESAREAN SECTION N/A 06/09/2014   Procedure: CESAREAN SECTION;  Surgeon: Farrel Gobble. Harrington Challenger, MD;  Location: [facility];  Service: Obstetrics;  Laterality: N/A;  . CESAREAN SECTION N/A 09/22/2015   Procedure: CESAREAN SECTION;  Surgeon: Jerelyn Charles, MD;  Location: [facility];  Service: Obstetrics;  Laterality: N/A;  . CHOLECYSTECTOMY    . CHOLECYSTECTOMY, LAPAROSCOPIC    . DIAGNOSTIC  LAPAROSCOPY     removal of meckels diverticulum  . LAPAROSCOPIC SMALL BOWEL RESECTION N/A 11/02/2017   Procedure: LAPAROSCOPIC REMOVAL OF MECKEL'S DIVERTICULUM ERAS PATHWAY;  Surgeon: Excell Seltzer, MD;  Location: [facility];  Service: General;  Laterality: N/A;  . TUBAL LIGATION    . WISDOM TOOTH EXTRACTION      There were no vitals filed for this visit.  Subjective Assessment - 07/03/18 1305    Currently in Pain?  No/denies        S: "I need to lean more on my friends when I am stressed, rather than isolating"   O: Education given on stress management as it applies to BADL/IADL function when reintegrating into community. Pt given handouts with purpose of identifying current stress and emotional responses. Pt then to identify how social support and healthy comping mechanisms can help combat this stress in a healthy way. Additionally, pt asked to identify both unhealthy and healthy coping mechanisms that they may have used in the past or can use for the future. Pt given stress diary and educated further on the option of stress management apps to use to continue to manage when reintegrating  into community. Pt to identify which means of management is preferred. Entire worksheet not finished, to finish next date.  A: Pt presents to group with blunted affect, engaged and participatory throughout session. Pt engaged and in understanding of handouts given, expressing that she often times does not utilize her social support to its fully capacity. Pt identified that isolation was an unhealthy coping mechanism, and using her friends as a more productive distraction is a more healthy option. At this time pt prefers an online/app based stress diary and committed to using starting this date and when fully reintegrating into community.   P: OT will continue to follow up with stress management education and stress diary to ensure successful implementation of coping skills as community reintegration begins.                     OT Education - 07/03/18 1305    Education Details  education given on stress management    Person(s) Educated  Patient    Methods  Explanation;Handout    Comprehension  Verbalized understanding       OT Short Term Goals - 06/23/18 1259      OT SHORT TERM GOAL #1   Title  Pt will be education on strategies to improve psychosocial skills needed to participate fully in all daily, leisure, and work activities    Time  4    Period  Weeks    Status  On-going    Target Date  07/20/18      OT SHORT TERM GOAL #2   Title  Pt will apply psychosocial skills and coping mechanisms to daily activities in order to function independently and reintegrate into community dwelling    Time  4    Period  Weeks    Status  On-going    Target Date  07/20/18      OT SHORT TERM GOAL #3   Title  Pt will choose and/or engage in 1-3 socially engaging leisure activities to improve social participation upon reintegrating into community    Time  4    Period  Weeks    Status  On-going    Target Date  07/20/18      OT SHORT TERM GOAL #4   Title  Pt will explore 1-3 new meaningful roles to engage in upon reintegrating into community    Time  4    Period  Weeks    Status  On-going    Target Date  07/20/18      OT SHORT TERM GOAL #5   Title  Pt will engage in goal setting to improve funcitonal BADL/IADL routine upon reintegrating into community    Period  Weeks    Status  On-going    Target Date  07/20/18               Plan - 07/03/18 1305    Occupational performance deficits (Please refer to evaluation for details):  ADL's;IADL's;Rest and Sleep;Work;Leisure;Social Participation       Patient will benefit from skilled therapeutic intervention in order to improve the following deficits and impairments:  Decreased coping skills, Decreased psychosocial skills, Other (comment)(decreased ability to engage in BADL and reintegrate into community)  Visit  Diagnosis: Organic personality disorder  Difficulty coping    Problem List Patient Active Problem List   Diagnosis Date Noted  . PTSD (post-traumatic stress disorder) 05/04/2018  . Intentional benzodiazepine overdose ([place]) 05/01/2018  . Severe recurrent major depression without psychotic features ([place])  05/01/2018  . Suicide attempt ([place]) 05/01/2018  . GERD (gastroesophageal reflux disease) 02/13/2018  . Fatty liver 02/13/2018  . Lower abdominal pain 02/13/2018  . Weight gain, abnormal 07/28/2017  . Routine general medical examination at a health care facility 05/25/2016  . Lipoma of lower extremity 03/08/2016  . Hyperlipidemia 02/22/2016  . Elevated transaminase level 02/20/2016  . Leukocytosis 02/20/2016  . 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 Essential hypertension 01/07/2016  . Placental abruption 09/23/2015  . Encounter for biometric screening 09/30/2013  . Screening for lipoid disorders 09/28/2013  . Diabetes mellitus screening 09/28/2013  . Hypothyroid 11/04/2011  . Depression 10/04/2011  . HEADACHE 11/18/2009  . Adjustment disorder with mixed anxiety and depressed mood 11/17/2009  . FATIGUE 10/06/2009  . TOBACCO USE 02/19/2009  . CHICKENPOX, HX OF 02/19/2009  . DEFICIENCY OF OTHER VITAMINS 04/29/2008  . OTHER CONSTIPATION 04/29/2008   Zenovia Jarred, MSOT, OTR/L Behavioral Health OT/ Acute Relief OT PHP Office: [phone]  Zenovia Jarred 07/03/2018, 1:06 PM  [facility] PARTIAL HOSPITALIZATION PROGRAM [place], [place], [address] Phone: [phone]   Fax:  [phone]  Name: Any Mcneice MRN: [id number] Date of Birth: January 08, 1986

## 2018-07-04 ENCOUNTER — Other Ambulatory Visit (HOSPITAL_COMMUNITY): Payer: 59 | Admitting: Occupational Therapy

## 2018-07-04 ENCOUNTER — Encounter (HOSPITAL_COMMUNITY): Payer: Self-pay | Admitting: Occupational Therapy

## 2018-07-04 ENCOUNTER — Telehealth: Payer: Self-pay

## 2018-07-04 ENCOUNTER — Other Ambulatory Visit (HOSPITAL_COMMUNITY): Payer: 59 | Admitting: Licensed Clinical Social Worker

## 2018-07-04 ENCOUNTER — Encounter (HOSPITAL_COMMUNITY): Payer: Self-pay

## 2018-07-04 VITALS — BP 126/76 | HR 91 | Ht 67.0 in | Wt 201.0 lb

## 2018-07-04 DIAGNOSIS — J208 Acute bronchitis due to other specified organisms: Secondary | ICD-10-CM

## 2018-07-04 DIAGNOSIS — F070 Personality change due to known physiological condition: Secondary | ICD-10-CM

## 2018-07-04 DIAGNOSIS — F332 Major depressive disorder, recurrent severe without psychotic features: Secondary | ICD-10-CM

## 2018-07-04 DIAGNOSIS — R4589 Other symptoms and signs involving emotional state: Secondary | ICD-10-CM

## 2018-07-04 DIAGNOSIS — J988 Other specified respiratory disorders: Secondary | ICD-10-CM

## 2018-07-04 NOTE — Psych (Signed)
[facility] BH PHP THERAPIST PROGRESS NOTE  Michele Rojas [id number]  Session Time: 9:00 - 11:00  Participation Level: Active  Behavioral Response: CasualAlertDepressed  Type of Therapy: Group Therapy  Treatment Goals addressed: Coping  Interventions: CBT, DBT, Solution Focused, Supportive and Reframing  Summary: Clinician led check-in regarding current stressors and situation, and review of patient completed daily inventory. Clinician utilized active listening and empathetic response and validated patient emotions. Clinician facilitated processing group on pertinent issues.    Therapist Response: Michele Rojas is a 32 y.o. female who presents with depression symptoms. Patient arrived within time allowed and reports that she is feeling "tired" Patient rates her mood at a 6 on a scale of 1-10 with 10 being great. Pt reports she is still fighting illness and feels that while the medicine helped initially she has stopped improving. Pt states she cleaned the church on Saturday and the house on Sunday and that it took longer than normal due to her low energy. Pt shares that she and her mom had a conversation about their conflict last week and pt felt it went well and they are on good terms.  Patient engaged in discussion.        Session Time: 11:00 -12:00   Participation Level: Active   Behavioral Response: CasualAlertDepressed   Type of Therapy: Group Therapy, OT   Treatment Goals addressed: Coping   Interventions: Psychosocial skills training, Supportive,    Summary:  Occupational Therapy group   Therapist Response: Patient engaged in group. See OT note.          Session Time: 12:00 - 12:45  Participation Level: Active  Behavioral Response: CasualAlertDepressed  Type of Therapy: Group Therapy  Treatment Goals addressed: Coping  Interventions: Systems analyst, Supportive  Summary:  Reflection Group: Patients encouraged to practice skills and  interpersonal techniques or work on mindfulness and relaxation techniques. The importance of self-care and making skills part of a routine to increase usage were stressed   Therapist Response: Patient engaged and participated appropriately.       Session Time: 12:45- 2:00  Participation Level: Active  Behavioral Response: CasualAlertDepressed  Type of Therapy: Group Therapy, Psychoeducation; Psychotherapy  Treatment Goals addressed: Coping  Interventions: CBT; Solution focused; Supportive; Reframing  Summary: 12:45 - 1:50: Group participated in relationship workshop. Pt's took  inventory of current relationships and discussed what it means to maintain a positive relationship. Boundaries, communication, trust building, and healthy/unhealthy traits were discussed as pertained to pt's relationship struggles.  1:50 -2:00 Clinician led check-out. Clinician assessed for immediate needs, medication compliance and efficacy, and safety concerns   Therapist Response: Patient engaged in activity and discussion. Patient identified main issues with current relationships being the "quality" of the relationships. Pt engaged in discussion on trust and how to build the relationship you want.  At [place], patient rates her mood at a 3 on a scale of 1-10 with 10 being great. Pt reports afternoon plans to pick up her children and rest.  Pt demonstrates some progress as evidenced by using improved communication skills and boundaries when having the conversation with her mom. Pt denies SI/HI/self-harm at the end of group.      Suicidal/Homicidal: Nowithout intent/plan   Plan: Pt will continue in PHP while working to decrease depression symptoms and increase ability to manage symptoms in a healthy manner.    Diagnosis: Severe recurrent major depression without psychotic features ([place]) [F33.2]    1. Severe recurrent major depression without psychotic features ([place])  Lorin Glass,  LCSW 07/04/2018

## 2018-07-04 NOTE — Psych (Signed)
[facility] BH PHP THERAPIST PROGRESS NOTE  Michele Rojas [id number]  Session Time: 9:00 - 10:15  Participation Level: Active  Behavioral Response: CasualAlertDepressed  Type of Therapy: Group Therapy  Treatment Goals addressed: Coping  Interventions: CBT, DBT, Solution Focused, Supportive and Reframing  Summary: Clinician led check-in regarding current stressors and situation, and review of patient completed daily inventory. Clinician utilized active listening and empathetic response and validated patient emotions. Clinician facilitated processing group on pertinent issues.    Therapist Response: Michele Rojas is a 32 y.o. female who presents with depression symptoms. Patient arrived within time allowed and reports that she is feeling "okay." Patient rates her mood at a 6 on a scale of 1-10 with 10 being great. Pt reports she did go to the doctor yesterday and she has a bronchial infection and was given medicine. Pt reports sleeping better yesterday because of it. Pt states having family time last night and it went well. Pt reports continued struggles with energy.  Patient engaged in discussion.        Session Time: 10:15 -11:00  Participation Level: Active  Behavioral Response: CasualAlertDepressed  Type of Therapy: Group Therapy, psychoeducation, psychotherapy  Treatment Goals addressed: Coping  Interventions: CBT, DBT, Solution Focused, Supportive and Reframing  Summary:  Clinician led discussion on guilt and how it can affect and shape our thinking. Group discussed ways guilt plays a role in their lives and their struggles with the feeling. Cln brought in emotional reasoning and discussed how a feeling does not have to equal truth.    Therapist Response: Patient engaged and participated in discussion.  Pt reports struggling with guilt because she feels she "should" feel guilty for some things. Intellectually, pt reports some understanding of how negative core  beliefs and self worth may be at play, however reports still struggling to believe it.        Session Time: 11:00 -12:00   Participation Level: Active   Behavioral Response: CasualAlertDepressed   Type of Therapy: Group Therapy, OT   Treatment Goals addressed: Coping   Interventions: Psychosocial skills training, Supportive,    Summary:  Occupational Therapy group   Therapist Response: Patient engaged in group. See OT note.        Session Time: 12:00- 1:00  Participation Level: Active  Behavioral Response: CasualAlertDepressed  Type of Therapy: Group Therapy, Psychoeducation; Psychotherapy  Treatment Goals addressed: Coping  Interventions: CBT; Solution focused; Supportive; Reframing Summary: 12:00 - 12:50: Cln continued topic of cognitive distortions. Group discussed how to "challenge" the unhealthy thought patterns once recognized. Group reviewed "Socratic Questions" handout and went over how to challenge irrational thoughts using that handout. 12:50 -1:00 Clinician led check-out. Clinician assessed for immediate needs, medication compliance and efficacy, and safety concerns  Therapist Response:  Patient engaged in activity. Pt states understanding of how to challenge unhealthy thoughts and successfully reframed examples in discussion.  At [place], patient rates her mood at a 6 on a scale of 1-10 with 10 being great. Patient reports weekend plans of resting so she can feel better.   Patient demonstrates some progress as evidenced by increased sharing of her feelings. Patient denies SI/HI/self-harm at the end of group.      Suicidal/Homicidal: Nowithout intent/plan   Plan: Pt will continue in PHP while working to decrease depression symptoms and increase ability to manage symptoms in a healthy manner.    Diagnosis: Severe recurrent major depression without psychotic features ([place]) [F33.2]    1. Severe recurrent major depression  without psychotic features  [facility])       Lorin Glass, LCSW 07/04/2018
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
## 2018-07-04 NOTE — Telephone Encounter (Signed)
Copied from [place] [id number]. Topic: General - Other >> Jul 04, 2018 11:05 AM Janace Aris A wrote: Reason for CRM: Pt called in wanting to advise Philis Nettle that she is still experiencing the same symptoms but worse, she is coughing up discolored sputum and is inquiring on getting a medication sent to the pharmacy. She says she just came in to see her on 06/29/18

## 2018-07-04 NOTE — Psych (Signed)
   [facility] BH PHP THERAPIST PROGRESS NOTE  Michele Rojas [id number]  Session Time: 9:00 - 11:00  Participation Level: Active  Behavioral Response: CasualAlertDepressed  Type of Therapy: Group Therapy  Treatment Goals addressed: Coping  Interventions: CBT, DBT, Solution Focused, Supportive and Reframing  Summary: Clinician led check-in regarding current stressors and situation, and review of patient completed daily inventory. Clinician utilized active listening and empathetic response and validated patient emotions. Clinician facilitated processing group on pertinent issues.    Therapist Response: [name] is a 32 y.o. female who presents with depression symptoms. Patient arrived within time allowed and reports that she is feeling "exhausted". Patient rates her mood at a 5 on a scale of 1-10 with 10 being great. Pt states that she is not sleeping well due to coughing throughout the night. Pt states that she briefly saw her mother and there was some tension, however it was not as bad as anticiapted. Patient continues to struggle with feelings of worthlessness. Patient engaged in discussion.        Session Time: 11:00 -12:15  Participation Level: Active  Behavioral Response: CasualAlertDepressed  Type of Therapy: Group Therapy, psychotherapy  Treatment Goals addressed: Coping  Interventions: Strengths based, reframing, Supportive,   Summary:  Spiritual Care group  Therapist Response: Patient engaged in group. See chaplain note.         Session Time: 12:15 - 1:00  Participation Level: Active  Behavioral Response: CasualAlertDepressed  Type of Therapy: Group Therapy  Treatment Goals addressed: Coping  Interventions: Systems analyst, Supportive  Summary:  Reflection Group: Patients encouraged to practice skills and interpersonal techniques or work on mindfulness and relaxation techniques. The importance of self-care and making skills part of a  routine to increase usage were stressed   Therapist Response: Patient engaged and participated appropriately.       Session Time: 1:00- 2:00  Participation Level: Active  Behavioral Response: CasualAlertDepressed  Type of Therapy: Group Therapy, Psychoeducation, Activity therapy  Treatment Goals addressed: Coping  Interventions: CBT, DBT, Solution Focused, Supportive and Reframing  Summary: 1:00 - 1:50: Clinician introduced topic of cognitive distortions. Cln educated on what cognitive distortions are and how they affect [place]. Cln introduced "Catch, Challenge, Change" and group began to review cognitive distortion handout and came up with examples to work on "catch." 1:50 -2:00 Clinician led check-out. Clinician assessed for immediate needs, medication compliance and efficacy, and safety concerns   Therapist Response: Patient engaged activity and discussion. Pt was able to identify real life examples of cognitive distortions discussed.  At [place], patient rates her mood at a 4 on a scale of 1-10 with 10 being great. Patient reports afternoon plans of going to the store then home. Patient demonstrates some progress as evidenced by continued utilization of communication skills with spouse. Patient denies SI/HI/self-harm thoughts at the end of group.      Suicidal/Homicidal: Nowithout intent/plan   Plan: Pt will continue in PHP while working to decrease depression symptoms and increase ability to manage symptoms in a healthy manner.    Diagnosis: Severe recurrent major depression without psychotic features ([place]) [F33.2]    1. Severe recurrent major depression without psychotic features [facility])       Lorin Glass, LCSW 07/04/2018

## 2018-07-04 NOTE — Psych (Signed)
   [facility] BH PHP THERAPIST PROGRESS NOTE  Michele Rojas [id number]  Session Time: 9:00 - 11:00  Participation Level: Active  Behavioral Response: CasualAlertDepressed  Type of Therapy: Group Therapy  Treatment Goals addressed: Coping  Interventions: CBT, DBT, Solution Focused, Supportive and Reframing  Summary: Clinician led check-in regarding current stressors and situation, and review of patient completed daily inventory. Clinician utilized active listening and empathetic response and validated patient emotions. Clinician facilitated processing group on pertinent issues.    Therapist Response: Michele Rojas is a 32 y.o. female who presents with depression symptoms. Patient arrived within time allowed and reports that she is feeling "gross" Patient rates her mood at a 6 on a scale of 1-10 with 10 being great. Pt reports her cold is persisting as is her children's which made for a rough weekend. Pt reports "not really" taking cold medication and is encouraged to. Pt reports poor sleep due, about 3-4 hours, due to being up coughing. Patient is struggling with energy and concentration today. Patient engaged in discussion.        Session Time: 11:00 -12:00   Participation Level: Active   Behavioral Response: CasualAlertDepressed   Type of Therapy: Group Therapy, OT   Treatment Goals addressed: Coping   Interventions: Psychosocial skills training, Supportive,    Summary:  Occupational Therapy group   Therapist Response: Patient engaged in group. See OT note.          Session Time: 12:00 - 12:45  Participation Level: Active  Behavioral Response: CasualAlertDepressed  Type of Therapy: Group Therapy  Treatment Goals addressed: Coping  Interventions: Systems analyst, Supportive  Summary:  Reflection Group: Patients encouraged to practice skills and interpersonal techniques or work on mindfulness and relaxation techniques. The importance of  self-care and making skills part of a routine to increase usage were stressed   Therapist Response: Patient engaged and participated appropriately.       Session Time: 12:45- 2:00  Participation Level: Active  Behavioral Response: CasualAlertDepressed  Type of Therapy: Group Therapy, Psychoeducation; Psychotherapy  Treatment Goals addressed: Coping  Interventions: CBT; Solution focused; Supportive; Reframing  Summary: 12:45 - 1:50: Clinician continued topic of ways to cope with symptoms and discussed grounding techniques. Cln provided education on the purpose and application of grounding. Group practiced different techniques.  1:50 -2:00 Clinician led check-out. Clinician assessed for immediate needs, medication compliance and efficacy, and safety concerns   Therapist Response: Patient engaged activity and discussion. Pt able to recognize strategies for application. At [place], patient rates her mood at a 6 on a scale of 1-10 with 10 being great. Patient reports afternoon plans of "the normal family stuff." Patient demonstrates some progress as evidenced by coming to group despite wanting to stay in bed. Patient denies SI/HI/self-harm at the end of group.      Suicidal/Homicidal: Nowithout intent/plan   Plan: Pt will continue in PHP while working to decrease depression symptoms and increase ability to manage symptoms in a healthy manner.    Diagnosis: Severe recurrent major depression without psychotic features ([place]) [F33.2]    1. Severe recurrent major depression without psychotic features [facility])       Lorin Glass, LCSW 07/04/2018

## 2018-07-04 NOTE — Therapy (Signed)
[place], [place], [address] Phone: [phone]   Fax:  [phone]  Occupational Therapy Treatment  Patient Details  Name: Michele Rojas MRN: [id number] Date of Birth: 14-Jul-1986 Referring Provider (OT): Ricky Ala, NP   Encounter Date: 07/04/2018  OT End of Session - 07/04/18 1252    Visit Number  7    Number of Visits  16    Date for OT Re-Evaluation  07/20/18    Authorization Type  [employer]    OT Start Time  1100    OT Stop Time  1200    OT Time Calculation (min)  60 min    Activity Tolerance  Patient tolerated treatment well    Behavior During Therapy  [facility] for tasks assessed/performed       Past Medical History:  Diagnosis Date  . ADD (attention deficit disorder)    no meds  . Anxiety   . Constipation   . Depression   . Fatigue   . Fatty liver   . Gallstones   . GERD (gastroesophageal reflux disease)   . History of chicken pox as a child  . History of preterm delivery    2015- 31wks, 2017- 35wks  . HLD (hyperlipidemia)   . HPV (human papilloma virus) infection   . HTN (hypertension)    No meds  . Hx of abnormal cervical Pap smear    ASCUS, LGSIL, CIN I, [place]  . Hypothyroidism    no meds  . Meckel's diverticulitis   . Mood swings   . Obesity   . Preeclampsia    2015 & 2017 pregnancies  . Right ovarian cyst    2.5cm  . Smoker   . Tobacco abuse   . Vitamin deficiency     Past Surgical History:  Procedure Laterality Date  . ACHILLES TENDON SURGERY Bilateral   . BONE MARROW BIOPSY  06/2017  . CESAREAN SECTION N/A 06/09/2014   Procedure: CESAREAN SECTION;  Surgeon: Farrel Gobble. Harrington Challenger, MD;  Location: [facility];  Service: Obstetrics;  Laterality: N/A;  . CESAREAN SECTION N/A 09/22/2015   Procedure: CESAREAN SECTION;  Surgeon: Jerelyn Charles, MD;  Location: [facility];  Service: Obstetrics;  Laterality: N/A;  . CHOLECYSTECTOMY    . CHOLECYSTECTOMY, LAPAROSCOPIC    . DIAGNOSTIC  LAPAROSCOPY     removal of meckels diverticulum  . LAPAROSCOPIC SMALL BOWEL RESECTION N/A 11/02/2017   Procedure: LAPAROSCOPIC REMOVAL OF MECKEL'S DIVERTICULUM ERAS PATHWAY;  Surgeon: Excell Seltzer, MD;  Location: [facility];  Service: General;  Laterality: N/A;  . TUBAL LIGATION    . WISDOM TOOTH EXTRACTION      There were no vitals filed for this visit.  Subjective Assessment - 07/04/18 1251    Currently in Pain?  No/denies      S: "I will sometimes drink to manage my stress"  O: Continued education given on stress management this date. Pt given handouts to identify personal unhealthy vs healthy coping strategies and the outcomes of each. Further education given on stress management skills to use as pt reintegrates into BADL routine. Coping strategies taught include: relaxation based- deep breathing, counting to 10, taking a 1 minute vacation, acceptance, stress balls, relaxation audio/video, visual/mental imagery. Positive mental attitude- gratitude, acceptance, cognitive reframing, positive self talk, anger management.   A: Pt presents to group with blunted affect, engaged and participatory throughout session. Pt states that drinking and socially isolating are her unhealthy coping mechanisms. Pt is in  understanding of stress management tools worksheet, stating that she would like to practice cognitive reframing to help form positive and healthy thoughts.  P: OT will continue to follow up on stress management strategies to ensure successful implementation.                      OT Education - 07/04/18 1251    Education Details  continued education given on stress management    Person(s) Educated  Patient    Methods  Explanation;Handout    Comprehension  Verbalized understanding       OT Short Term Goals - 06/23/18 1259      OT SHORT TERM GOAL #1   Title  Pt will be education on strategies to improve psychosocial skills needed to participate fully in all daily,  leisure, and work activities    Time  4    Period  Weeks    Status  On-going    Target Date  07/20/18      OT SHORT TERM GOAL #2   Title  Pt will apply psychosocial skills and coping mechanisms to daily activities in order to function independently and reintegrate into community dwelling    Time  4    Period  Weeks    Status  On-going    Target Date  07/20/18      OT SHORT TERM GOAL #3   Title  Pt will choose and/or engage in 1-3 socially engaging leisure activities to improve social participation upon reintegrating into community    Time  4    Period  Weeks    Status  On-going    Target Date  07/20/18      OT SHORT TERM GOAL #4   Title  Pt will explore 1-3 new meaningful roles to engage in upon reintegrating into community    Time  4    Period  Weeks    Status  On-going    Target Date  07/20/18      OT SHORT TERM GOAL #5   Title  Pt will engage in goal setting to improve funcitonal BADL/IADL routine upon reintegrating into community    Period  Weeks    Status  On-going    Target Date  07/20/18               Plan - 07/04/18 1252    Occupational performance deficits (Please refer to evaluation for details):  ADL's;IADL's;Rest and Sleep;Work;Leisure;Social Participation       Patient will benefit from skilled therapeutic intervention in order to improve the following deficits and impairments:  Decreased coping skills, Decreased psychosocial skills, Other (comment)(decreased ability to engage in BADL and reintegrate into community)  Visit Diagnosis: Organic personality disorder  Difficulty coping    Problem List Patient Active Problem List   Diagnosis Date Noted  . PTSD (post-traumatic stress disorder) 05/04/2018  . Intentional benzodiazepine overdose ([place]) 05/01/2018  . Severe recurrent major depression without psychotic features ([place]) 05/01/2018  . Suicide attempt ([place]) 05/01/2018  . GERD (gastroesophageal reflux disease) 02/13/2018  . Fatty liver  02/13/2018  . Lower abdominal pain 02/13/2018  . Weight gain, abnormal 07/28/2017  . Routine general medical examination at a health care facility 05/25/2016  . Lipoma of lower extremity 03/08/2016  . Hyperlipidemia 02/22/2016  . Elevated transaminase level 02/20/2016  . Leukocytosis 02/20/2016  . 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 Essential hypertension 01/07/2016  . Placental abruption 09/23/2015  . Encounter for biometric screening 09/30/2013  .  Screening for lipoid disorders 09/28/2013  . Diabetes mellitus screening 09/28/2013  . Hypothyroid 11/04/2011  . Depression 10/04/2011  . HEADACHE 11/18/2009  . Adjustment disorder with mixed anxiety and depressed mood 11/17/2009  . FATIGUE 10/06/2009  . TOBACCO USE 02/19/2009  . CHICKENPOX, HX OF 02/19/2009  . DEFICIENCY OF OTHER VITAMINS 04/29/2008  . OTHER CONSTIPATION 04/29/2008   Zenovia Jarred, MSOT, OTR/L Behavioral Health OT/ Acute Relief OT PHP Office: [phone]  Zenovia Jarred 07/04/2018, 12:54 PM  [facility] PARTIAL HOSPITALIZATION PROGRAM [place], [place], [address] Phone: [phone]   Fax:  [phone]  Name: Michele Rojas MRN: [id number] Date of Birth: 1986/02/07

## 2018-07-04 NOTE — Progress Notes (Signed)
Patient presented with sad affect, depressed mood but denied any suicidal or homicidal ideations, no auditory or visual hallucinations and no plan or intent to want to harm self or others at this time.  Patient reported her husband and her were doing a little bit better this week, that she was trying to spend more time talking to him about the past to help validate his hurt, and that they had had several "good days". Patient is still physically sick and coughing quite a bit.  Reports she is using the prescribed Tessalon pears 3 times a day to help with coughing and almost done with her recently prescribed prednisone pack.  Patient agreed with plan to call the provider's office she saw the previous week to inform of continued symptoms and now admitting to coughing up yellowish phlegm.  Patient reported she does not think she has had a temperature but has woken up several times wet with sweat over the past few days and not sleeping well due to coughing bouts.  Patient rated her current level of depression a 4, anxiety a 6, and hopelessness a 3 on a scale of 0-10 with 0 being none and 10 the worst she could manage.  Patient reported feeling PHP has been helpful with opening up and with developing coping skills.  Patient is considering looking into couple's counseling upon completion of PHP and denied any problems with current medications.  Patient stable at this time with PHP group but will follow up with PCP regarding possible upper respiratory issues and potential infection.  Agreed to call their office today and will go there if needed and requested.

## 2018-07-05 ENCOUNTER — Other Ambulatory Visit (HOSPITAL_COMMUNITY): Payer: 59 | Admitting: Licensed Clinical Social Worker

## 2018-07-05 DIAGNOSIS — F332 Major depressive disorder, recurrent severe without psychotic features: Secondary | ICD-10-CM

## 2018-07-05 MED ORDER — METHYLPREDNISOLONE 4 MG PO TBPK: ORAL_TABLET | ORAL | 0 refills | Status: DC

## 2018-07-05 MED ORDER — AZITHROMYCIN 250 MG PO TABS: ORAL_TABLET | ORAL | 0 refills | Status: DC

## 2018-07-05 NOTE — Telephone Encounter (Signed)
Zpak sent in and also another round of steroid taper down sent to pharmacy

## 2018-07-05 NOTE — Addendum Note (Signed)
Addended by: Philis Nettle on: 07/05/2018 10:25 AM   Modules accepted: Orders

## 2018-07-05 NOTE — Telephone Encounter (Signed)
Called Pt to tell her that NP Philis Nettle sent a Zpak  and also another round of steroid taper to pharmacy. No answer left a message. Okay for Pec to speak to Pt.

## 2018-07-05 NOTE — Telephone Encounter (Signed)
Recommendations per notes of Lauren,NP on 07/05/18 relayed to the pt.  Pt verbalized understanding.

## 2018-07-05 NOTE — Telephone Encounter (Signed)
Reason for CRM: Pt called in wanting to advise Lauren Guse that she is still experiencing the same symptoms but worse, she is coughing up discolored sputum and is inquiring on getting a medication sent to the pharmacy. She says she just came in to see her on 06/29/18

## 2018-07-06 ENCOUNTER — Ambulatory Visit (HOSPITAL_COMMUNITY): Payer: Self-pay

## 2018-07-07 ENCOUNTER — Ambulatory Visit (HOSPITAL_COMMUNITY): Payer: Self-pay

## 2018-07-09 ENCOUNTER — Encounter (HOSPITAL_COMMUNITY): Payer: Self-pay

## 2018-07-09 NOTE — Progress Notes (Signed)
BH MD/PA/NP OP Progress Note  07/09/2018 5:29 PM Michele Rojas  MRN:  [id number]   Evaluation: Michele Rojas observed attending daily group session.  She reports feeling better overall however reports she continues to struggle with boundaries and communication with her husband.  Patient reports feelings of guilt with any disagreements and/or argument between she and her husband.  Patient was encouraged to seek marital counseling patient appeared to be receptive to plan.  Reports taking her medication as prescribed.  Reports she still waiting authorization for Jornay 20 mg for ADHD reported symptoms.  Patient continues to express difficulty staying asleep discussed titrating trazodone from 50 mg to 100 mg x 1 repeat.  Patient reports worsening upper respiratory infection.  Discussed interrupted sleep could be due to cough and URI infection.  We will continue to monitor. Michele Rojas continues to deny suicidal or homicidal ideations.  Denies auditory visual hallucinations.  Rates her depression 8 out of 10 with 10 being the worst.  Support encouragement reassurance was provided.  History:Assessment notes [address] [place] is [name] presents with attempted suicide by overdose.Patient reports recent discharge from [facility] inpatient admission. Patient reports multiple stressors surrounding her marriage and financial situation. Patient reports a few years ago she had affair and her husband found out however they are currently working through their marriage.Patient reports any little verbal altercations cause her to feel worse about her self. State that morning was the" last straw".Patient reports feelings of guilt which is why she attempted to overdose on Xanax and Klonopin.Reports this is her first inpatient admission. Denies history ofprevious attempts or plan. Denies history of selfinjuresbehaviors. 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 Patient is requesting to be restarted  on Adderall. Education provided with recent suicidal attempt and initiating controlled substance.Patient appeared to be agreeable to plan.Discussed initiating trazodone 50 mg p.o. nightly for insomnia reported symptoms.patient was enrolled in partial psychiatric program on 06/21/18.   Visit Diagnosis:    ICD-10-CM   1. Severe recurrent major depression without psychotic features ([place]) F33.2     Past Psychiatric History:  Past Medical History:  Past Medical History:  Diagnosis Date  . ADD (attention deficit disorder)    no meds  . Anxiety   . Constipation   . Depression   . Fatigue   . Fatty liver   . Gallstones   . GERD (gastroesophageal reflux disease)   . History of chicken pox as a child  . History of preterm delivery    2015- 31wks, 2017- 35wks  . HLD (hyperlipidemia)   . HPV (human papilloma virus) infection   . HTN (hypertension)    No meds  . Hx of abnormal cervical Pap smear    ASCUS, LGSIL, CIN I, Colpo  . Hypothyroidism    no meds  . Meckel's diverticulitis   . Mood swings   . Obesity   . Preeclampsia    2015 & 2017 pregnancies  . Right ovarian cyst    2.5cm  . Smoker   . Tobacco abuse   . Vitamin deficiency     Past Surgical History:  Procedure Laterality Date  . ACHILLES TENDON SURGERY Bilateral   . BONE MARROW BIOPSY  06/2017  . CESAREAN SECTION N/A 06/09/2014   Procedure: CESAREAN SECTION;  Surgeon: Michele Rojas. Harrington Challenger, MD;  Location: [facility];  Service: Obstetrics;  Laterality: N/A;  . CESAREAN SECTION N/A 09/22/2015   Procedure: CESAREAN SECTION;  Surgeon: Michele Charles, MD;  Location: [facility];  Service: Obstetrics;  Laterality: N/A;  . CHOLECYSTECTOMY    . CHOLECYSTECTOMY, LAPAROSCOPIC    . DIAGNOSTIC LAPAROSCOPY     removal of meckels diverticulum  . LAPAROSCOPIC SMALL BOWEL RESECTION N/A 11/02/2017   Procedure: LAPAROSCOPIC REMOVAL OF MECKEL'S DIVERTICULUM ERAS PATHWAY;  Surgeon: Michele Seltzer, MD;  Location: [facility];  Service: General;   Laterality: N/A;  . TUBAL LIGATION    . WISDOM TOOTH EXTRACTION      Family Psychiatric History:   Family History:  Family History  Problem Relation Age of Onset  . Hypertension Father   . Hyperlipidemia Father   . Prostate cancer Father   . Aneurysm Maternal Grandfather   . Depression Mother        after a car accident  . Colon polyps Mother   . Other Mother        ? CAD /heart disease  . Depression Brother        major depression  . Graves' disease Paternal Uncle   . Alzheimer's disease Paternal Grandmother   . Alzheimer's disease Paternal Grandfather   . Celiac disease Cousin        maternal cousin    Social History:  Social History   Socioeconomic History  . Marital status: Married    Spouse name: Not on file  . Number of children: 2  . Years of education: Not on file  . Highest education level: Not on file  Occupational History  . Occupation: [employer]  Social Needs  . Financial resource strain: Not very hard  . Food insecurity:    Worry: Sometimes true    Inability: Sometimes true  . Transportation needs:    Medical: No    Non-medical: No  Tobacco Use  . Smoking status: Current Every Day Smoker    Packs/day: 1.00    Years: 10.00    Pack years: 10.00    Types: Cigarettes  . Smokeless tobacco: Never Used  . Tobacco comment: Has cut back with upper respiratory viral infection to 1/2 pack a day  Substance and Sexual Activity  . Alcohol use: Yes    Alcohol/week: 2.0 standard drinks    Types: 2 Cans of beer per week    Comment: rare  . Drug use: No  . Sexual activity: Yes    Partners: Male    Birth control/protection: Surgical  Lifestyle  . Physical activity:    Days per week: 0 days    Minutes per session: 0 min  . Stress: Rather much  Relationships  . Social connections:    Talks on phone: More than three times a week    Gets together: Three times a week    Attends religious service: Never    Active member of club or organization: No     Attends meetings of clubs or organizations: Never    Relationship status: Married  Other Topics Concern  . 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 Amoxicillin Other (See Comments)    REACTION: yeast infection in mouth Has patient had a PCN reaction causing immediate rash, facial/tongue/throat swelling, SOB or lightheadedness with hypotension: No Has patient had a PCN reaction causing severe rash involving mucus membranes or skin necrosis: No Has patient had a PCN reaction that  required hospitalization no Has patient had a PCN reaction occurring within the last 10 years: Yes If all of the above answers are "NO", then may proceed with Cephalosporin use.    Metabolic Disorder Labs: Lab Results  Component Value Date   HGBA1C 5.5 05/04/2018   MPG 111.15 05/04/2018   MPG 111 08/12/2016   Lab Results  Component Value Date   PROLACTIN 6.4 07/13/2016   Lab Results  Component Value Date   CHOL 211 (H) 05/04/2018   TRIG 376 (H) 05/04/2018   HDL 29 (L) 05/04/2018   CHOLHDL 7.3 05/04/2018   VLDL 75 (H) 05/04/2018   LDLCALC 107 (H) 05/04/2018   Lab Results  Component Value Date   TSH 7.49 (H) 06/20/2018   TSH 6.542 (H) 05/04/2018    Therapeutic Level Labs: No results found for: LITHIUM No results found for: VALPROATE No components found for:  CBMZ  Current Medications: Current Outpatient Medications  Medication Sig Dispense Refill  . albuterol (PROVENTIL HFA;VENTOLIN HFA) 108 (90 Base) MCG/ACT inhaler Inhale 2 puffs into the lungs every 6 (six) hours as needed for wheezing or shortness of breath. 1 Inhaler 1  . azithromycin (ZITHROMAX) 250 MG tablet Take 2 tablets on day 1 and take 1 tablet on days 2-5 6 tablet 0  . benzonatate (TESSALON) 100 MG capsule Take 1 capsule (100 mg total)  by mouth 3 (three) times daily as needed for cough. 30 capsule 1  . buPROPion (WELLBUTRIN XL) 300 MG 24 hr tablet Take 1 tablet (300 mg total) by mouth daily. 30 tablet 0  . hydrOXYzine (ATARAX/VISTARIL) 50 MG tablet Take 1 tablet (50 mg total) by mouth 2 (two) times daily as needed for anxiety. 60 tablet 0  . levothyroxine (SYNTHROID, LEVOTHROID) 50 MCG tablet Take 1 tablet (50 mcg total) by mouth daily before breakfast. 30 tablet 11  . methylPREDNISolone (MEDROL DOSEPAK) 4 MG TBPK tablet Take according to pack instructions 21 tablet 0  . ranitidine (ZANTAC) 150 MG capsule Take 150 mg by mouth daily.    . traZODone (DESYREL) 100 MG tablet Take 1 tablet (100 mg total) by mouth at bedtime as needed for sleep. 60 tablet 0   No current facility-administered medications for this visit.      Musculoskeletal: Strength & Muscle Tone: within normal limits Gait & Station: normal Patient leans: N/A  Psychiatric Specialty Exam: ROS  Blood pressure 126/76, pulse 91, height '5\' 7"'  (1.702 m), weight 201 lb (91.2 kg), SpO2 98 %.Body mass index is 31.48 kg/m.  General Appearance: Casual and Guarded  Eye Contact:  Fair  Speech:  Clear and Coherent  Volume:  Normal  Mood:  Anxious and Depressed  Affect:  Congruent  Thought Process:  Coherent  Orientation:  Full (Time, Place, and Person)  Thought Content: Logical   Suicidal Thoughts:  No  Homicidal Thoughts:  No  Memory:  Immediate;   Fair Recent;   Fair Remote;   Fair  Judgement:  Fair  Insight:  Fair  Psychomotor Activity:  Normal  Concentration:  Concentration: Fair  Recall:  [employer] of Knowledge: Fair  Language: Fair  Akathisia:  No  Handed:  Right  AIMS (if indicated):   Assets:  Communication Skills Desire for Improvement Social Support  ADL's:  Intact  Cognition: WNL  Sleep:  Fair interrupted due to reported upper respiratory infection (URI)   Screenings: AUDIT     Admission (Discharged) from 05/03/2018 in [facility]  Alcohol Use Disorder  Identification Test Final Score (AUDIT)  6    GAD-7     Counselor from 06/21/2018 in [facility]  Total GAD-7 Score  15    PHQ2-9     Counselor from 06/21/2018 in [facility] Office Visit from [facility]/12/2016 in [facility] MD EVALUATION AND MANAGEMENT from 07/08/2015 in [facility] +14 ALL from 06/18/2015 in [facility]  PHQ-2 Total Score  5  0  0  0  PHQ-9 Total Score  19  -  -  -       Assessment and Plan:  Continue partial hospitalization program Medication management             Continue  100 mg po QHS             Continue Vistaril 25 mg p.o. twice daily             Continue Wellbutrin 3 mg p.o.   Treatment plan was reviewed and agreed upon by NP T.Bobby Rumpf and patient Michele Rojas need for continued group services   Derrill Center, NP 07/09/2018, 5:29 PM

## 2018-07-10 ENCOUNTER — Other Ambulatory Visit (HOSPITAL_COMMUNITY): Payer: 59 | Admitting: Occupational Therapy

## 2018-07-10 ENCOUNTER — Encounter (HOSPITAL_COMMUNITY): Payer: Self-pay

## 2018-07-10 ENCOUNTER — Other Ambulatory Visit (HOSPITAL_COMMUNITY): Payer: 59 | Attending: Psychiatry | Admitting: Licensed Clinical Social Worker

## 2018-07-10 DIAGNOSIS — Z79899 Other long term (current) drug therapy: Secondary | ICD-10-CM | POA: Insufficient documentation

## 2018-07-10 DIAGNOSIS — F419 Anxiety disorder, unspecified: Secondary | ICD-10-CM | POA: Insufficient documentation

## 2018-07-10 DIAGNOSIS — E785 Hyperlipidemia, unspecified: Secondary | ICD-10-CM | POA: Insufficient documentation

## 2018-07-10 DIAGNOSIS — E039 Hypothyroidism, unspecified: Secondary | ICD-10-CM | POA: Diagnosis not present

## 2018-07-10 DIAGNOSIS — F332 Major depressive disorder, recurrent severe without psychotic features: Secondary | ICD-10-CM | POA: Insufficient documentation

## 2018-07-10 DIAGNOSIS — K219 Gastro-esophageal reflux disease without esophagitis: Secondary | ICD-10-CM | POA: Diagnosis not present

## 2018-07-10 DIAGNOSIS — E669 Obesity, unspecified: Secondary | ICD-10-CM | POA: Diagnosis not present

## 2018-07-10 DIAGNOSIS — F909 Attention-deficit hyperactivity disorder, unspecified type: Secondary | ICD-10-CM | POA: Diagnosis not present

## 2018-07-10 DIAGNOSIS — I10 Essential (primary) hypertension: Secondary | ICD-10-CM | POA: Diagnosis not present

## 2018-07-10 DIAGNOSIS — F17210 Nicotine dependence, cigarettes, uncomplicated: Secondary | ICD-10-CM | POA: Diagnosis not present

## 2018-07-10 DIAGNOSIS — R4589 Other symptoms and signs involving emotional state: Secondary | ICD-10-CM

## 2018-07-10 DIAGNOSIS — F070 Personality change due to known physiological condition: Secondary | ICD-10-CM

## 2018-07-10 NOTE — Therapy (Signed)
[place], [place], [address] Phone: [phone]   Fax:  [phone]  Occupational Therapy Treatment  Patient Details  Name: Michele Rojas MRN: [id number] Date of Birth: March 02, 1986 Referring Provider (OT): Ricky Ala, NP   Encounter Date: 07/10/2018  OT End of Session - 07/10/18 1435    Visit Number  8    Number of Visits  16    Date for OT Re-Evaluation  07/20/18    Authorization Type  [employer]    OT Start Time  1100    OT Stop Time  1200    OT Time Calculation (min)  60 min    Activity Tolerance  Patient tolerated treatment well    Behavior During Therapy  [facility] for tasks assessed/performed       Past Medical History:  Diagnosis Date  . ADD (attention deficit disorder)    no meds  . Anxiety   . Constipation   . Depression   . Fatigue   . Fatty liver   . Gallstones   . GERD (gastroesophageal reflux disease)   . History of chicken pox as a child  . History of preterm delivery    2015- 31wks, 2017- 35wks  . HLD (hyperlipidemia)   . HPV (human papilloma virus) infection   . HTN (hypertension)    No meds  . Hx of abnormal cervical Pap smear    ASCUS, LGSIL, CIN I, Colpo  . Hypothyroidism    no meds  . Meckel's diverticulitis   . Mood swings   . Obesity   . Preeclampsia    2015 & 2017 pregnancies  . Right ovarian cyst    2.5cm  . Smoker   . Tobacco abuse   . Vitamin deficiency     Past Surgical History:  Procedure Laterality Date  . ACHILLES TENDON SURGERY Bilateral   . BONE MARROW BIOPSY  06/2017  . CESAREAN SECTION N/A 06/09/2014   Procedure: CESAREAN SECTION;  Surgeon: Farrel Gobble. Harrington Challenger, MD;  Location: [facility];  Service: Obstetrics;  Laterality: N/A;  . CESAREAN SECTION N/A 09/22/2015   Procedure: CESAREAN SECTION;  Surgeon: Jerelyn Charles, MD;  Location: [facility];  Service: Obstetrics;  Laterality: N/A;  . CHOLECYSTECTOMY    . CHOLECYSTECTOMY, LAPAROSCOPIC    . DIAGNOSTIC  LAPAROSCOPY     removal of meckels diverticulum  . LAPAROSCOPIC SMALL BOWEL RESECTION N/A 11/02/2017   Procedure: LAPAROSCOPIC REMOVAL OF MECKEL'S DIVERTICULUM ERAS PATHWAY;  Surgeon: Excell Seltzer, MD;  Location: [facility];  Service: General;  Laterality: N/A;  . TUBAL LIGATION    . WISDOM TOOTH EXTRACTION      There were no vitals filed for this visit.  Subjective Assessment - 07/10/18 1434    Currently in Pain?  No/denies        S: "My schedule is so unpredictable with the kids"   O: Education received on time management to help increase occupational balance and quality of life. Time management activity given, with teams of 2 listing names of states on blank map in set amount of time. Reflection with group given on strategy, purpose, and feelings on activity. Activities wheel activity administered with pt to identify hours devoted to: work/obligations, leisure/relaxation, self-care/caregiving, and sleep/rest. Further education given to allow pt to better balance activity wheel for increased occupational balance. Weekly planner handouts given at end of session with education on different modalities of organization (planner,?apps, computer, to do lists, etc.) so pt could choose  preferred avenue. Further education given on the importance of time management in community reintegration from [facility] and how to continue using skills. Additional information given on medication management, from the aspect of lifestyle/organizational skills. Pt given medication chart and various apps to use to help increase compliance of medication while implementing it into BADL routine.   A:?Pt presents to group with blunted affect, engaged and participatory throughout session. Pt engaged in activity wheel, stating that her schedule is unpredictable considering her young children. She plans to continue to build a schedule as her children age, considering it is mostly filled with caregiving. She plans to download apps to  help manage routine and medications after this date.  P: Pt provided with education on time management skills to increase occupational balance and quality of life. OT will continue to follow up with pt for successful implementation into daily life.?                     OT Education - 07/10/18 1434    Education Details  education given on time management skills    Person(s) Educated  Patient    Methods  Explanation;Handout    Comprehension  Verbalized understanding       OT Short Term Goals - 06/23/18 1259      OT SHORT TERM GOAL #1   Title  Pt will be education on strategies to improve psychosocial skills needed to participate fully in all daily, leisure, and work activities    Time  4    Period  Weeks    Status  On-going    Target Date  07/20/18      OT SHORT TERM GOAL #2   Title  Pt will apply psychosocial skills and coping mechanisms to daily activities in order to function independently and reintegrate into community dwelling    Time  4    Period  Weeks    Status  On-going    Target Date  07/20/18      OT SHORT TERM GOAL #3   Title  Pt will choose and/or engage in 1-3 socially engaging leisure activities to improve social participation upon reintegrating into community    Time  4    Period  Weeks    Status  On-going    Target Date  07/20/18      OT SHORT TERM GOAL #4   Title  Pt will explore 1-3 new meaningful roles to engage in upon reintegrating into community    Time  4    Period  Weeks    Status  On-going    Target Date  07/20/18      OT SHORT TERM GOAL #5   Title  Pt will engage in goal setting to improve funcitonal BADL/IADL routine upon reintegrating into community    Period  Weeks    Status  On-going    Target Date  07/20/18               Plan - 07/10/18 1435    Occupational performance deficits (Please refer to evaluation for details):  ADL's;IADL's;Rest and Sleep;Work;Leisure;Social Participation       Patient will benefit  from skilled therapeutic intervention in order to improve the following deficits and impairments:  Decreased coping skills, Decreased psychosocial skills, Other (comment)(decreased ability to engage in BADL and reintegrate into community)  Visit Diagnosis: Organic personality disorder  Difficulty coping    Problem List Patient Active Problem List   Diagnosis Date Noted  .  PTSD (post-traumatic stress disorder) 05/04/2018  . Intentional benzodiazepine overdose ([place]) 05/01/2018  . Severe recurrent major depression without psychotic features ([place]) 05/01/2018  . Suicide attempt ([place]) 05/01/2018  . GERD (gastroesophageal reflux disease) 02/13/2018  . Fatty liver 02/13/2018  . Lower abdominal pain 02/13/2018  . Weight gain, abnormal 07/28/2017  . Routine general medical examination at a health care facility 05/25/2016  . Lipoma of lower extremity 03/08/2016  . Hyperlipidemia 02/22/2016  . Elevated transaminase level 02/20/2016  . Leukocytosis 02/20/2016  . 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 Essential hypertension 01/07/2016  . Placental abruption 09/23/2015  . Encounter for biometric screening 09/30/2013  . Screening for lipoid disorders 09/28/2013  . Diabetes mellitus screening 09/28/2013  . Hypothyroid 11/04/2011  . Depression 10/04/2011  . HEADACHE 11/18/2009  . Adjustment disorder with mixed anxiety and depressed mood 11/17/2009  . FATIGUE 10/06/2009  . TOBACCO USE 02/19/2009  . CHICKENPOX, HX OF 02/19/2009  . DEFICIENCY OF OTHER VITAMINS 04/29/2008  . OTHER CONSTIPATION 04/29/2008   Zenovia Jarred, MSOT, OTR/L Behavioral Health OT/ Acute Relief OT PHP Office: [phone]  Zenovia Jarred 07/10/2018, 2:36 PM  [place] [place] [place], [place], [address] Phone: [phone]   Fax:  [phone]  Name: Michele Rojas MRN: [id number] Date of Birth: 11/15/1985

## 2018-07-11 ENCOUNTER — Other Ambulatory Visit (HOSPITAL_COMMUNITY): Payer: 59 | Admitting: Occupational Therapy

## 2018-07-11 ENCOUNTER — Encounter (HOSPITAL_COMMUNITY): Payer: Self-pay | Admitting: Occupational Therapy

## 2018-07-11 ENCOUNTER — Encounter (HOSPITAL_COMMUNITY): Payer: Self-pay

## 2018-07-11 ENCOUNTER — Other Ambulatory Visit (HOSPITAL_COMMUNITY): Payer: Self-pay | Admitting: Family

## 2018-07-11 ENCOUNTER — Other Ambulatory Visit (HOSPITAL_COMMUNITY): Payer: 59 | Admitting: Licensed Clinical Social Worker

## 2018-07-11 VITALS — BP 124/84 | HR 94 | Ht 67.0 in | Wt 200.0 lb

## 2018-07-11 DIAGNOSIS — F332 Major depressive disorder, recurrent severe without psychotic features: Secondary | ICD-10-CM

## 2018-07-11 DIAGNOSIS — F070 Personality change due to known physiological condition: Secondary | ICD-10-CM

## 2018-07-11 DIAGNOSIS — R4589 Other symptoms and signs involving emotional state: Secondary | ICD-10-CM

## 2018-07-11 MED ORDER — QUETIAPINE FUMARATE 25 MG PO TABS: 25.0000 mg | ORAL_TABLET | Freq: Two times a day (BID) | ORAL | 0 refills | Status: DC | PRN

## 2018-07-11 NOTE — Progress Notes (Signed)
  [facility] Partial Hospitlization Outpatient Program Discharge Summary  Jaymes Revels [id number]  Admission date: 06/28/2018 Discharge date: 07/11/2018  Reason for admission: Per admission assessment :Michele Rojas Szczesny is [name] presents with attempted suicide by overdose.Patient reports recent discharge from [place] inpatient admission. Patient reports multiple stressors surrounding her marriage and financial situation. Patient reports a few years ago she had affair and her husband found out however they are currently working through their marriage.Patient reports any little verbal altercations cause her to feel worse about her self. State that morning was the" last straw".Patient reports feelings of guilt which is why she attempted to overdose on Xanax and Klonopin.Reports this is her first inpatient admission. Denies history ofprevious attempts or plan. Denies history of selfinjuresbehaviors. 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 Patient is requesting to be restarted on Adderall. Education provided with recent suicidal attempt and initiating controlled substance.Patient appeared to be agreeable to plan.Discussed initiating trazodone 50 mg p.o. nightly for insomnia reported symptoms.patient was enrolled in partial psychiatric program on 06/21/18.  Chemical Use History: Denied  Family of Origin Issues: Ongoing, Patient was encouraged to seek family/ martial counseling   Progress in Program Toward Treatment Goals: Patient attended and participated with daily group session. Patient was initiated on Seroquel 25 mg for worsening.  Anxiety to discaharge. Reported she started take [place] 2 days prior and feels this medication is helping with her [facility] symptoms.   Progress (rationale): Step down to IOP   Take all medications as prescribed. Keep all follow-up appointments as scheduled.  Do not consume alcohol or use illegal drugs  while on prescription medications. Report any adverse effects from your medications to your primary care provider promptly.  In the event of recurrent symptoms or worsening symptoms, call 911, a crisis hotline, or go to the nearest emergency department for evaluation.   Derrill Center, NP 07/11/2018

## 2018-07-11 NOTE — Therapy (Addendum)
[place], [place], [address] Phone: [phone]   Fax:  [phone]  Occupational Therapy Treatment  Patient Details  Name: Michele Rojas MRN: [id number] Date of Birth: March 13, 1986 Referring Provider (OT): Ricky Ala, NP   Encounter Date: 07/11/2018  OT End of Session - 07/11/18 1332    Visit Number  9    Number of Visits  16    Date for OT Re-Evaluation  07/20/18    Authorization Type  [employer]    OT Start Time  1100    OT Stop Time  1200    OT Time Calculation (min)  60 min    Activity Tolerance  Patient tolerated treatment well    Behavior During Therapy  [facility] for tasks assessed/performed       Past Medical History:  Diagnosis Date  . ADD (attention deficit disorder)    no meds  . Anxiety   . Constipation   . Depression   . Fatigue   . Fatty liver   . Gallstones   . GERD (gastroesophageal reflux disease)   . History of chicken pox as a child  . History of preterm delivery    2015- 31wks, 2017- 35wks  . HLD (hyperlipidemia)   . HPV (human papilloma virus) infection   . HTN (hypertension)    No meds  . Hx of abnormal cervical Pap smear    ASCUS, LGSIL, CIN I, Colpo  . Hypothyroidism    no meds  . Meckel's diverticulitis   . Mood swings   . Obesity   . Preeclampsia    2015 & 2017 pregnancies  . Right ovarian cyst    2.5cm  . Smoker   . Tobacco abuse   . Vitamin deficiency     Past Surgical History:  Procedure Laterality Date  . ACHILLES TENDON SURGERY Bilateral   . BONE MARROW BIOPSY  06/2017  . CESAREAN SECTION N/A 06/09/2014   Procedure: CESAREAN SECTION;  Surgeon: Farrel Gobble. Harrington Challenger, MD;  Location: [facility];  Service: Obstetrics;  Laterality: N/A;  . CESAREAN SECTION N/A 09/22/2015   Procedure: CESAREAN SECTION;  Surgeon: Jerelyn Charles, MD;  Location: [facility];  Service: Obstetrics;  Laterality: N/A;  . CHOLECYSTECTOMY    . CHOLECYSTECTOMY, LAPAROSCOPIC    . DIAGNOSTIC  LAPAROSCOPY     removal of meckels diverticulum  . LAPAROSCOPIC SMALL BOWEL RESECTION N/A 11/02/2017   Procedure: LAPAROSCOPIC REMOVAL OF MECKEL'S DIVERTICULUM ERAS PATHWAY;  Surgeon: Excell Seltzer, MD;  Location: [facility];  Service: General;  Laterality: N/A;  . TUBAL LIGATION    . WISDOM TOOTH EXTRACTION      There were no vitals filed for this visit.  Subjective Assessment - 07/11/18 1332    Currently in Pain?  No/denies        S: "I drink a lot of caffeine in the evenings and smoke"  O: Pt educated on sleep hygiene as it pertains to daily life/routines this date. Education given on appropriate sleep routines, sleep disorders, detriments of too much/too little sleep with encouraged feedback of personal Experiences. Sleep diary handout given to challenge pt to track current habits and identify area for change. Further education given on relaxation techniques to implement before bed. Pt asked to identify one STG in relation to sleep hygiene to create better daily sleep habits. Educational video on common sleep myths shown to help increase understanding.  A: Pt presents to group with appropriate affect, engaged and participatory  throughout session. Education received in a positive manner to help improve sleep hygiene in daily life. Pt agreeable to use sleep diary this date. Pt identified goal of limiting TV exposure before bed and limiting caffeine in the afternoon. Pt in understanding of video, stating she was shocked that you cannot effectively "catch up" on lost sleep.   P: Pt provided with skills to increase sleep hygiene habits into daily routine. OT will continue to follow up with communication skills for successful implementation into daily life.                      OT Education - 07/11/18 1332    Education Details  education given on sleep hygiene    Person(s) Educated  Patient    Methods  Explanation;Handout    Comprehension  Verbalized understanding        OT Short Term Goals - 07/11/18 1333      OT SHORT TERM GOAL #1   Title  Pt will be education on strategies to improve psychosocial skills needed to participate fully in all daily, leisure, and work activities    Time  4    Period  Weeks    Status  Achieved    Target Date  07/20/18      OT SHORT TERM GOAL #2   Title  Pt will apply psychosocial skills and coping mechanisms to daily activities in order to function independently and reintegrate into community dwelling    Time  4    Period  Weeks    Status  Achieved    Target Date  07/20/18      OT SHORT TERM GOAL #3   Title  Pt will choose and/or engage in 1-3 socially engaging leisure activities to improve social participation upon reintegrating into community    Time  4    Period  Weeks    Status  Achieved    Target Date  07/20/18      OT SHORT TERM GOAL #4   Title  Pt will explore 1-3 new meaningful roles to engage in upon reintegrating into community    Time  4    Period  Weeks    Status  Partially Met    Target Date  07/20/18      OT SHORT TERM GOAL #5   Title  Pt will engage in goal setting to improve funcitonal BADL/IADL routine upon reintegrating into community    Time  4    Period  Weeks    Status  Partially Met    Target Date  07/20/18               Plan - 07/11/18 1332    Occupational performance deficits (Please refer to evaluation for details):  ADL's;IADL's;Rest and Sleep;Work;Leisure;Social Participation       Patient will benefit from skilled therapeutic intervention in order to improve the following deficits and impairments:  Decreased coping skills, Decreased psychosocial skills, Other (comment)(decreased ability to engage in BADL and reintegrate into community)  Visit Diagnosis: Organic personality disorder  Difficulty coping    Problem List Patient Active Problem List   Diagnosis Date Noted  . PTSD (post-traumatic stress disorder) 05/04/2018  . Intentional benzodiazepine overdose  ([place]) 05/01/2018  . Severe recurrent major depression without psychotic features ([place]) 05/01/2018  . Suicide attempt ([place]) 05/01/2018  . GERD (gastroesophageal reflux disease) 02/13/2018  . Fatty liver 02/13/2018  . Lower abdominal pain 02/13/2018  . Weight gain, abnormal 07/28/2017  .  Routine general medical examination at a health care facility 05/25/2016  . Lipoma of lower extremity 03/08/2016  . Hyperlipidemia 02/22/2016  . Elevated transaminase level 02/20/2016  . Leukocytosis 02/20/2016  . 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 Essential hypertension 01/07/2016  . Placental abruption 09/23/2015  . Encounter for biometric screening 09/30/2013  . Screening for lipoid disorders 09/28/2013  . Diabetes mellitus screening 09/28/2013  . Hypothyroid 11/04/2011  . Depression 10/04/2011  . HEADACHE 11/18/2009  . Adjustment disorder with mixed anxiety and depressed mood 11/17/2009  . FATIGUE 10/06/2009  . TOBACCO USE 02/19/2009  . CHICKENPOX, HX OF 02/19/2009  . DEFICIENCY OF OTHER VITAMINS 04/29/2008  . OTHER CONSTIPATION 04/29/2008    OCCUPATIONAL THERAPY DISCHARGE SUMMARY  Visits from Start of Care: 9  Current functional level related to goals / functional outcomes: Independent, pt is stepping down to IOP level of care   Remaining deficits: Continuing to implement coping skills/psychosocial skills   Education / Equipment: Education given on psychosocial and coping mechanisms as it applies to increasing function in BADL/IADL routine upon reintegrating into community. Plan: Patient agrees to discharge.  Patient goals were partially met. Patient is being discharged due to being pleased with the current functional level.  ?????        Zenovia Jarred, MSOT, OTR/L Behavioral Health OT/ Acute Relief OT PHP Office: [place] 07/11/2018, 1:37 PM  [facility] HOSPITALIZATION PROGRAM [place], [place],  [address] Phone: [phone]   Fax:  [phone]  Name: Michele Rojas MRN: [id number] Date of Birth: August 19, 1985

## 2018-07-11 NOTE — Progress Notes (Signed)
Met with patient today as she presented with brighter affect, continued depressed mood but admits this has improved and no current suicidal or homicidal ideations, and no plans or intent to want to harm self or others.  Patient reported her husband and her are having better discussions and communicating more effectively so this has been a positive he has acknowledged noticing from her attendance in [place] too.  Patient reported at times, when things are not going good, such as then her kids are acting up, she has an occasional suicidal thought, but acknowledges now these are much less often and there is no thought of intent or plan behind them.  Patient rated her current level of depression a 4, anxiety a 7-8, and hopelessness a 2- 3 on a scale of 0-10 with 0 being none and 10 the worst she could manage.  Patient reports plans to move to IOP on tomorrow and ready for transition.   States she still thinks she needs something different for anxiety and discussed with her and Ricky Ala, NP who will start patient on Seroquel 25 mg, twice a day as needed and stop Hydroxyzine.  Patient to follow-up with NP when attending IOP if any problems with this medication change and reported no other concerns at this time. Patient also admitted feeling much better physically now and finished antibiotics for recent upper respiratory infection. Patient returned to [facility] group to finish her last day and will try medication change later this date.

## 2018-07-12 ENCOUNTER — Other Ambulatory Visit (HOSPITAL_COMMUNITY): Payer: 59 | Admitting: Family

## 2018-07-12 ENCOUNTER — Encounter (HOSPITAL_COMMUNITY): Payer: Self-pay | Admitting: Psychiatry

## 2018-07-12 ENCOUNTER — Encounter (HOSPITAL_COMMUNITY): Payer: Self-pay | Admitting: Family

## 2018-07-12 DIAGNOSIS — F332 Major depressive disorder, recurrent severe without psychotic features: Secondary | ICD-10-CM | POA: Diagnosis not present

## 2018-07-12 NOTE — Progress Notes (Signed)
    Daily Group Progress Note  Program: IOP  Group Time: 9am-12pm  Participation Level: Active  Behavioral Response: Appropriate, Sharing and Motivated  Type of Therapy:  Group Therapy; psycho-educational group, process group  Summary of Progress:  The purpose of this group is to utilize CBT and DBT skills in a group setting to increase knowledge and utilization of healthy coping skills and decrease intensity of active mental health symptoms.  9am-10:30am Clinician provided psycho-education on Mindfulness. Clinician and group members discussed components and benefits of practicing mindfulness. Clinician provided clients group activity where remaining mindful and in the moment was necessary. Clinician praised client participated and identifying thoughts which caused distractions. Clinician and group members discussed ways to add mindful moments into the day, such as mindful walking and eating. Clinician and group members practiced 5 senses grounding skill in session. Clinicians and group members discussed differences in rational, emotional, and wise mind. Clinician and group members discussed the importance of positive self talk and being mindful of when thoughts are causing uncomfortable feelings.  10:30am-12pm Clinician provided clients the opportunity to practice mindfulness using guided visualization. Clinician and group members discussed thoughts and feelings related to this activity. Clinician praised clients honesty in identifying specific thoughts and feelings. Clinician presented WHAT and HOW skills and discussed with clients. Clinician and group members processed some of the pros and cons of adding mindfulness into the day. Clinician and group members practiced identifying the differences in stating facts vs non-judgmental stances. Clinician and group members completed FIT activity focused on using mindfulness in once specific activity. Clients were able to create fidgets and add scents and  were encouraged to use these in their grounding activities. Client met with NP and Case manager, see note for details. Client participated in all group discussions and activities. Client was able to identify postive self traits and planned activity for self care.  Olegario Messier, LCSW

## 2018-07-12 NOTE — Progress Notes (Addendum)
Psychiatric Initial Adult Assessment   Patient Identification: Michele Rojas MRN:  [id number] Date of Evaluation:  07/12/2018 Referral Source: Partial Hospitalization Program.   Step down  Chief Complaint:   Chief Complaint    Depression; Anxiety; Trauma; ADHD; Stress     Visit Diagnosis:    ICD-10-CM   1. Severe recurrent major depression without psychotic features ([place]) F33.2     Evaluation: Morning recently completed partial hospitalization program after intentional overdose.  Patient presents with a brighter affect she continues to deny suicidal or homicidal ideations.  Denies auditory visual hallucinations.  States she would like to continue working on coping skills that she is learned during the [facility] admission.  Patient reports she has attempted to set up marital counseling between she and her husband as a work through her infidelities.  Patient continues to request benzodiazepines education was provided will initiate Seroquel for mood stabilization.  Patient to be admitted to IOP 07/12/2018  Per admission assessment :[place] [name] presents with attempted suicide by overdose.Patient reports recent discharge from [facility] inpatient admission. Patient reports multiple stressors surrounding her marriage and financial situation. Patient reports a few years ago she had affair and her husband found out however they are currently working through their marriage.Patient reports any little verbal altercations cause her to feel worse about her self. State that morning was the" last straw".Patient reports feelings of guilt which is why she attempted to overdose on Xanax and Klonopin.Reports this is her first inpatient admission. Denies history ofprevious attempts or plan. Denies history of selfinjuresbehaviors. 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 Patient is requesting to be restarted on Adderall. Education provided with recent suicidal  attempt and initiating controlled substance.Patient appeared to be agreeable to plan.Discussed initiating trazodone 50 mg p.o. nightly for insomnia reported symptoms.patient was enrolled in partial psychiatric program on 06/21/18.  Associated Signs/Symptoms: Depression Symptoms:  depressed mood, suicidal thoughts with specific plan, suicidal attempt, (Hypo) Manic Symptoms:  Impulsivity, Irritable Mood, Anxiety Symptoms:  Excessive Worry, Psychotic Symptoms:  Hallucinations: None PTSD Symptoms: NA  Past Psychiatric History: previous  inpatient admission. PHP completion, Depression/ Anxiety, history of self injurours behaviors. Suicidal  ideations/attempts  Previous Psychotropic Medications: No   Substance Abuse History in the last 12 months:  No.  Consequences of Substance Abuse: NA  Past Medical History:  Past Medical History:  Diagnosis Date  . ADD (attention deficit disorder)    no meds  . Anxiety   . Constipation   . Depression   . Fatigue   . Fatty liver   . Gallstones   . GERD (gastroesophageal reflux disease)   . History of chicken pox as a child  . History of preterm delivery    2015- 31wks, 2017- 35wks  . HLD (hyperlipidemia)   . HPV (human papilloma virus) infection   . HTN (hypertension)    No meds  . Hx of abnormal cervical Pap smear    ASCUS, LGSIL, CIN I, Colpo  . Hypothyroidism    no meds  . Meckel's diverticulitis   . Mood swings   . Obesity   . Preeclampsia    2015 & 2017 pregnancies  . Right ovarian cyst    2.5cm  . Smoker   . Tobacco abuse   . Vitamin deficiency     Past Surgical History:  Procedure Laterality Date  . ACHILLES TENDON SURGERY Bilateral   . BONE MARROW BIOPSY  06/2017  . CESAREAN SECTION N/A 06/09/2014   Procedure: CESAREAN  SECTION;  Surgeon: Farrel Gobble. Harrington Challenger, MD;  Location: [facility];  Service: Obstetrics;  Laterality: N/A;  . CESAREAN SECTION N/A 09/22/2015   Procedure: CESAREAN SECTION;  Surgeon: Jerelyn Charles, MD;   Location: [facility];  Service: Obstetrics;  Laterality: N/A;  . CHOLECYSTECTOMY    . CHOLECYSTECTOMY, LAPAROSCOPIC    . DIAGNOSTIC LAPAROSCOPY     removal of meckels diverticulum  . LAPAROSCOPIC SMALL BOWEL RESECTION N/A 11/02/2017   Procedure: LAPAROSCOPIC REMOVAL OF MECKEL'S DIVERTICULUM ERAS PATHWAY;  Surgeon: Excell Seltzer, MD;  Location: [facility];  Service: General;  Laterality: N/A;  . TUBAL LIGATION    . WISDOM TOOTH EXTRACTION      Family Psychiatric History:   Family History:  Family History  Problem Relation Age of Onset  . Hypertension Father   . Hyperlipidemia Father   . Prostate cancer Father   . Aneurysm Maternal Grandfather   . Depression Mother        after a car accident  . Colon polyps Mother   . Other Mother        ? CAD /heart disease  . Depression Brother        major depression  . Graves' disease Paternal Uncle   . Alzheimer's disease Paternal Grandmother   . Alzheimer's disease Paternal Grandfather   . Celiac disease Cousin        maternal cousin    Social History:   Social History   Socioeconomic History  . Marital status: Married    Spouse name: Not on file  . Number of children: 2  . Years of education: Not on file  . Highest education level: Not on file  Occupational History  . Occupation: [employer]  Social Needs  . Financial resource strain: Not very hard  . Food insecurity:    Worry: Sometimes true    Inability: Sometimes true  . Transportation needs:    Medical: No    Non-medical: No  Tobacco Use  . Smoking status: Current Every Day Smoker    Packs/day: 0.50    Years: 10.00    Pack years: 5.00    Types: Cigarettes  . Smokeless tobacco: Never Used  . Tobacco comment: Has cut back to 1/2 a pack and trying to quit  Substance and Sexual Activity  . Alcohol use: Yes    Alcohol/week: 2.0 standard drinks    Types: 2 Cans of beer per week    Comment: rare  . Drug use: No  . Sexual activity: Yes    Partners: Male    Birth  control/protection: Surgical  Lifestyle  . Physical activity:    Days per week: 0 days    Minutes per session: 0 min  . Stress: Rather much  Relationships  . Social connections:    Talks on phone: More than three times a week    Gets together: Three times a week    Attends religious service: Never    Active member of club or organization: No    Attends meetings of clubs or organizations: Never    Relationship status: Married  Other Topics Concern  . Not on file  Social History Narrative   GYN- Dr. 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 Amoxicillin Other (See Comments)  REACTION: yeast infection in mouth Has patient had a PCN reaction causing immediate rash, facial/tongue/throat swelling, SOB or lightheadedness with hypotension: No Has patient had a PCN reaction causing severe rash involving mucus membranes or skin necrosis: No Has patient had a PCN reaction that required hospitalization no Has patient had a PCN reaction occurring within the last 10 years: Yes If all of the above answers are "NO", then may proceed with Cephalosporin use.    Metabolic Disorder Labs: Lab Results  Component Value Date   HGBA1C 5.5 05/04/2018   MPG 111.15 05/04/2018   MPG 111 08/12/2016   Lab Results  Component Value Date   PROLACTIN 6.4 07/13/2016   Lab Results  Component Value Date   CHOL 211 (H) 05/04/2018   TRIG 376 (H) 05/04/2018   HDL 29 (L) 05/04/2018   CHOLHDL 7.3 05/04/2018   VLDL 75 (H) 05/04/2018   LDLCALC 107 (H) 05/04/2018   Lab Results  Component Value Date   TSH 7.49 (H) 06/20/2018    Therapeutic Level Labs: No results found for: LITHIUM No results found for: CBMZ No results found for: VALPROATE  Current Medications: Current Outpatient Medications  Medication Sig Dispense Refill  .  buPROPion (WELLBUTRIN XL) 300 MG 24 hr tablet Take 1 tablet (300 mg total) by mouth daily. 30 tablet 0  . JORNAY PM 60 MG CP24 Take 60 mg by mouth daily.  0  . levothyroxine (SYNTHROID, LEVOTHROID) 50 MCG tablet Take 1 tablet (50 mcg total) by mouth daily before breakfast. 30 tablet 11  . QUEtiapine (SEROQUEL) 25 MG tablet Take 1 tablet (25 mg total) by mouth 2 (two) times daily as needed. 30 tablet 0  . ranitidine (ZANTAC) 150 MG capsule Take 150 mg by mouth daily.    . traZODone (DESYREL) 100 MG tablet Take 1 tablet (100 mg total) by mouth at bedtime as needed for sleep. 60 tablet 0   No current facility-administered medications for this visit.     Musculoskeletal: Strength & Muscle Tone: within normal limits Gait & Station: normal Patient leans: N/A  Psychiatric Specialty Exam: Review of Systems  Psychiatric/Behavioral: Positive for depression (improving ). Negative for hallucinations and suicidal ideas. The patient is nervous/anxious.   All other systems reviewed and are negative.   There were no vitals taken for this visit.There is no height or weight on file to calculate BMI.  General Appearance: Casual  Eye Contact:  Fair  Speech:  Clear and Coherent  Volume:  Normal  Mood:  Anxious and Depressed  Affect:  Congruent  Thought Process:  Coherent  Orientation:  Full (Time, Place, and Person)  Thought Content:  Hallucinations: None  Suicidal Thoughts:  No  Homicidal Thoughts:  No  Memory:  Immediate;   Fair Recent;   Fair Remote;   Fair  Judgement:  Fair  Insight:  Fair  Psychomotor Activity:  Normal  Concentration:  Concentration: Fair  Recall:  [employer] of Knowledge:Fair  Language: Fair  Akathisia:  No  Handed:  Right  AIMS (if indicated):    Assets:  Communication Skills Desire for Improvement Resilience Social Support  ADL's:  Intact  Cognition: WNL  Sleep:  Fair   Screenings: AUDIT     Admission (Discharged) from 05/03/2018 in [facility]  Alcohol Use Disorder Identification Test Final Score (AUDIT)  6    GAD-7     Counselor from 07/11/2018 in [facility] Counselor from 06/21/2018 in [facility]  PROGRAM  Total GAD-7 Score  10  15    PHQ2-9     Counselor from 07/11/2018 in [facility] Counselor from 06/21/2018 in [facility] Office Visit from 08/12/2016 in [facility] for [facility]ious Disease MD EVALUATION AND MANAGEMENT from 07/08/2015 in [facility] +14 ALL from 06/18/2015 in [facility] [facility]  PHQ-2 Total Score  2  5  0  0  0  PHQ-9 Total Score  10  19  -  -  -      Assessment and Plan:  Admitted to IOP  Continue current medication as prescribed.   - Start Seroquel 25 mg QHS  for mood stablizaiton   Treatment plan was reviewed and agreed upon by NP T.Bobby Rumpf and patient Kyley Solow need for goup services.   Derrill [place], NP 12/4/20192:01 PM
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
## 2018-07-13 ENCOUNTER — Ambulatory Visit (HOSPITAL_COMMUNITY): Payer: 59

## 2018-07-14 ENCOUNTER — Other Ambulatory Visit (HOSPITAL_COMMUNITY): Payer: 59

## 2018-07-17 ENCOUNTER — Other Ambulatory Visit (HOSPITAL_COMMUNITY): Payer: 59 | Admitting: Licensed Clinical Social Worker

## 2018-07-17 DIAGNOSIS — F332 Major depressive disorder, recurrent severe without psychotic features: Secondary | ICD-10-CM | POA: Diagnosis not present

## 2018-07-17 NOTE — Psych (Signed)
[facility] BH PHP THERAPIST PROGRESS NOTE  Michele Rojas [id number]  Session Time: 9:00 - 11:00  Participation Level: Active  Behavioral Response: CasualAlertDepressed  Type of Therapy: Group Therapy  Treatment Goals addressed: Coping  Interventions: CBT, DBT, Solution Focused, Supportive and Reframing  Summary: Clinician led check-in regarding current stressors and situation, and review of patient completed daily inventory. Clinician utilized active listening and empathetic response and validated patient emotions. Clinician facilitated processing group on pertinent issues.    Therapist Response: Michele Rojas is a 32 y.o. female who presents with depression symptoms. Patient arrived within time allowed and reports that she is feeling "better than normal." Patient rates her mood at a 6 on a scale of 1-10 with 10 being great. Pt reports her anxiety is heightened because she woke up late and felt harried getting here/ Pt reports she had a positive talk with her husband last night and that she feels they are accumulating more positive/neutral interactions recently. Pt reports continued issues with sleep due to being ill and shares she has been having night sweats that are troubling to her. Patient engaged in discussion.        Session Time: 11:00 -12:00   Participation Level: Active   Behavioral Response: CasualAlertDepressed   Type of Therapy: Group Therapy, OT   Treatment Goals addressed: Coping   Interventions: Psychosocial skills training, Supportive,    Summary:  Occupational Therapy group   Therapist Response: Patient engaged in group. See OT note.          Session Time: 12:00 - 12:45  Participation Level: Active  Behavioral Response: CasualAlertDepressed  Type of Therapy: Group Therapy  Treatment Goals addressed: Coping  Interventions: Systems analyst, Supportive  Summary:  Reflection Group: Patients encouraged to practice skills and  interpersonal techniques or work on mindfulness and relaxation techniques. The importance of self-care and making skills part of a routine to increase usage were stressed   Therapist Response: Patient engaged and participated appropriately.       Session Time: 12:45- 2:00  Participation Level: Active  Behavioral Response: CasualAlertDepressed  Type of Therapy: Group Therapy, Psychoeducation; Psychotherapy  Treatment Goals addressed: Coping  Interventions: CBT; Solution focused; Supportive; Reframing  Summary: 12:45 - 1:50: Clinician introduced topic of feelings and emotions. Clinician discussed the way to contextualize feelings as things that come and go and the ability to choose which feelings we attach to. Cln provided education on the difference between feelings and reactions to feelings, highlighting that our reactions we can alter. Clinician utilized hand out "Myths about Emotions" and pt's discussed ways in which the myths felts true and how to challenge them. 1:50 -2:00 Clinician led check-out. Clinician assessed for immediate needs, medication compliance and efficacy, and safety concerns   Therapist Response: Pt reports understanding of feelings and topics discussed. Pt is able to recognize ways in which this framework can affect how they view feelings.  At [place], patient rates her mood at a 6 on a scale of 1-10 with 10 being great. Pt reports afternoon plans to do chores around the house.  Pt demonstrates some progress as evidenced by reporting some improvement in appetite. Pt denies SI/HI/self-harm at the end of group.      Suicidal/Homicidal: Nowithout intent/plan   Plan: Pt will continue in PHP while working to decrease depression symptoms and increase ability to manage symptoms in a healthy manner.    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 Severe  recurrent major depression without psychotic features [facility])        Lorin Glass, LCSW 07/17/2018

## 2018-07-17 NOTE — Progress Notes (Signed)
    Daily Group Progress Note  Program: IOP  Group Time: 9am-12pm  Participation Level: Active  Behavioral Response: Appropriate  Type of Therapy:  Group Therapy; psycho-educational group, process group  Summary of Progress:  The purpose of this group is to utilize CBT and DBT skills in a group setting to increase use of healthy coping skills and decrease intensity of active mental health symptoms. 9am-10:30am Clinician checked in with group members, reviewing daily inventory and assessing for SI/HI/psychosis and overall level of functioning. Clinician inquired about skills practiced over the weekend and praised clients use of skills. Clinician presented psycho-education on neuroplasticity and it's function on behaviors. Clinician played talk from Dr Kennith Gain related to neuroplasticity and ways to improve holding onto moments of joy. Clinician facilitated discussion with group members about using mindfulness to make and hold onto happy moments. Clinician provided information on Growth Mindset vs Fixed Mindset. Clinician and group members processed differences in types of mindset and how these thoughts shape behaviors.  10:30am-12pm Clinician presented Secondary school teacher, providing a review of coping skills including meditation, focused breathing, positive self affirmations, visualization, exercise, reading, and journaling. Clinician and group members participated in watercolor activity and discussed the importance of not having expectations of perfection in order to enjoy everyday moments of joy. Clinician prompted clients to complete parts of Positive Self-Talk Journal as clients previously noted concern with low self esteem. Clinician actively listened to clients, providing reflective statements and validating feelings. Clinician requested group members identify a self care activity for the day, and an overall goal for the week. Client participated in group activities and discussions. Client was  able to identify and describe a positive moment from over the weekend while interacting with her husband. Client would like to work on managing negative thoughts and focusing conversations of topics rather than blaming self.  Olegario Messier, LCSW

## 2018-07-18 ENCOUNTER — Other Ambulatory Visit (HOSPITAL_COMMUNITY): Payer: 59 | Admitting: Licensed Clinical Social Worker

## 2018-07-18 DIAGNOSIS — F332 Major depressive disorder, recurrent severe without psychotic features: Secondary | ICD-10-CM | POA: Diagnosis not present

## 2018-07-18 NOTE — Progress Notes (Signed)
    Daily Group Progress Note  Program: IOP  Group Time: 9am-12pm  Participation Level: Active  Behavioral Response: Appropriate  Type of Therapy:  Group Therapy; psycho-educational group, process group  Summary of Progress:  The purpose of this group is to utilize CBT and DBT skills in a group setting to increase knowledge of and utilization of healthy coping skills to manager active mental health symptoms.  9am-11am Clinician presented the topic of 'Feelings, Thoughts, and Mind Traps' from [employer]. Clinician processed with group members different mind traps and role played how to challenge each type of thought. Clinician challenged group members to identify distorted thoughts, the feeling which came with the thought, and create an alternative thoughts. Clinician and group members reviewed steps for challenging mind traps, starting with identifying uncomfortable feeling and related thought and disrupting thoughts with positive self talk and alternative, helpful thoughts. Clinician praised clients on going open to new thoughts, even if they did not agree. Clinician presented 'Roadblocks to Healthy Thinking: Ways of Thinking That Keep You Stuck." Clinician challenged group members to identify a time where they were caught in these, or others were caught in them during an interaction. Clinician and group members reviewed 'Developing New Thinking Habits' in response to distorted thinking. Clinician validated that skill is difficult and does not come overnight.  11am-12pm Second group co-facilitated by [employer]. Processing related to grief and losses and coping with life changes. Client participated in group discussions. Client noted she often 'guilt trips' herself however is improving at asking questions and fact checking her assumptions. Client noted during second group grief related to the start of motherhood not being as she planned and feeling that she did not bond as well with her children as she  would have wanted. Client notes her relationships with her husband and friends are improving.  Olegario Messier, LCSW

## 2018-07-19 ENCOUNTER — Other Ambulatory Visit (HOSPITAL_COMMUNITY): Payer: 59 | Admitting: Licensed Clinical Social Worker

## 2018-07-19 DIAGNOSIS — F332 Major depressive disorder, recurrent severe without psychotic features: Secondary | ICD-10-CM

## 2018-07-19 NOTE — Psych (Signed)
   [facility] BH PHP THERAPIST PROGRESS NOTE  Michele Rojas [id number]  Session Time: 9:00 - 11:00  Participation Level: Active  Behavioral Response: CasualAlertDepressed  Type of Therapy: Group Therapy  Treatment Goals addressed: Coping  Interventions: CBT, DBT, Solution Focused, Supportive and Reframing  Summary: Clinician led check-in regarding current stressors and situation, and review of patient completed daily inventory. Clinician utilized active listening and empathetic response and validated patient emotions. Clinician facilitated processing group on pertinent issues.    Therapist Response: Michele Rojas is a 32 y.o. female who presents with depression symptoms. Patient arrived within time allowed and reports that she is feeling "pretty goodl." Patient rates her mood at a 7 on a scale of 1-10 with 10 being great. Pt reports she feels her meds are helping which is providing hope. Pt reports her holiday went well without the tension she expected. Pt reports she continues to reach out to her supports for help. Patient engaged in discussion.    Session Time: 11:00 -12:00   Participation Level: Active   Behavioral Response: CasualAlertDepressed   Type of Therapy: Group Therapy, OT   Treatment Goals addressed: Coping   Interventions: Psychosocial skills training, Supportive,    Summary:  Occupational Therapy group   Therapist Response: Patient engaged in group. See OT note.          Session Time: 12:00 - 12:45  Participation Level: Active  Behavioral Response: CasualAlertDepressed  Type of Therapy: Group Therapy  Treatment Goals addressed: Coping  Interventions: Systems analyst, Supportive  Summary:  Reflection Group: Patients encouraged to practice skills and interpersonal techniques or work on mindfulness and relaxation techniques. The importance of self-care and making skills part of a routine to increase usage were stressed   Therapist  Response: Patient engaged and participated appropriately.       Session Time: 12:45- 2:00  Participation Level: Active  Behavioral Response: CasualAlertDepressed  Type of Therapy: Group Therapy, Psychoeducation; Psychotherapy  Treatment Goals addressed: Coping  Interventions: CBT; Solution focused; Supportive; Reframing  Summary: 12:45 - 1:50: Clinician led group on The Five Love Languages and how they can aid relationships. Group members discussed the importance of each language and took the [employer] quiz. Cln discussed how looking at love languages can counteract unhealthy thought patterns and improve interpersonal relationships. 1:50 -2:00 Clinician led check-out. Clinician assessed for immediate needs, medication compliance and efficacy, and safety concerns  Therapist Response: Patient engaged in activity and discussion. Patient identified her love language as quality time and words of affirmation. Pt reports she is going to work on communicating with her husband and friends how she feels love.   At [place], patient rates her mood at a 7 on a scale of 1-10 with 10 being great. Pt reports afternoon plans of spending time with her kids.  Pt demonstrates some progress as evidenced by being able to reframe some thoughts. Pt denies SI/HI/self-harm at the end of group.   Suicidal/Homicidal: Nowithout intent/plan   Plan: Pt will continue in PHP while working to decrease depression symptoms and increase ability to manage symptoms in a healthy manner.    Diagnosis: Severe recurrent major depression without psychotic features ([place]) [F33.2]    1. Severe recurrent major depression without psychotic features ([place])     Lenorris Karger J Bronco Mcgrory, LPCA, LCASA 07/19/2018
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
## 2018-07-19 NOTE — Progress Notes (Signed)
    Daily Group Progress Note  Program: IOP  Group Time: 9am-12pm  Participation Level: Active  Behavioral Response: Appropriate and Motivated  Type of Therapy:  Group Therapy; psycho-educational group, process group  Summary of Progress:  Clinician checked in with group, assessing for SI/HI/psychosis.  9am-11am Clinician presented the topic of Vulnerability. Clinician provided Video by Almond [employer]int. Clinician facilitated discussion with group members related to the topic of vulnerability. Clinician inquired about thoughts and feelings related to topic. Clinician and group members discussed pros of showing vulnerability and barriers to opening up. Clinician presented DBT Distress Tolerance Skills. Clinician and group members review Distraction skill with ACCEPTS, Self-Soothe with Senses, and Radical Acceptance. Clinician and group members practiced 5-4-3-2-1 mindfulness senses skill as well as square breathing.  Clinician and group members discussed the importance with the ACCEPT skill to be mindful of only using distraction skills to lessen intensity of emotion, not numb the emotion and return to isolating. Clinician and group members also reviewed TIPP skill.  11am-12pm Clinician facilitated a discussions on self esteem utilizing gratitude and positive self trait exercise [place]ika. Activity focused on using open-ended questions intended to promote discussions and processing about self-confidence, setting and achieving goals, role models, motivation, personal success and happiness. Client participated in group discussions and activities. Client demonstrated deep breathing skill and encouraging self talk when prompted during Totika activity.  Olegario Messier, LCSW

## 2018-07-20 ENCOUNTER — Other Ambulatory Visit (HOSPITAL_COMMUNITY): Payer: 59 | Admitting: Psychiatry

## 2018-07-20 ENCOUNTER — Telehealth (HOSPITAL_COMMUNITY): Payer: Self-pay | Admitting: Psychiatry

## 2018-07-21 ENCOUNTER — Other Ambulatory Visit (HOSPITAL_COMMUNITY): Payer: 59 | Admitting: Licensed Clinical Social Worker

## 2018-07-21 DIAGNOSIS — F332 Major depressive disorder, recurrent severe without psychotic features: Secondary | ICD-10-CM | POA: Diagnosis not present
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
## 2018-07-24 ENCOUNTER — Other Ambulatory Visit (HOSPITAL_COMMUNITY): Payer: 59 | Admitting: Licensed Clinical Social Worker

## 2018-07-24 DIAGNOSIS — F332 Major depressive disorder, recurrent severe without psychotic features: Secondary | ICD-10-CM

## 2018-07-24 NOTE — Psych (Signed)
   [facility] BH PHP THERAPIST PROGRESS NOTE  Michele Rojas [id number]  Session Time: 9:00 - 11:00  Participation Level: Active  Behavioral Response: CasualAlertDepressed  Type of Therapy: Group Therapy  Treatment Goals addressed: Coping  Interventions: CBT, DBT, Solution Focused, Supportive and Reframing  Summary: Clinician led check-in regarding current stressors and situation, and review of patient completed daily inventory. Clinician utilized active listening and empathetic response and validated patient emotions. Clinician facilitated processing group on pertinent issues.    Therapist Response: Michele Rojas is a 32 y.o. female who presents with depression symptoms. Patient arrived within time allowed and reports that she is feeling "decent." Patient rates her mood at a 6 on a scale of 1-10 with 10 being great. Pt reports her afternoon was "normal." Pt states she "got real down on myself" and felt that she was not doing enough. Pt shares she talked to her husband and he helped improve her mood. Pt able to process. Patient engaged in discussion.    Session Time: 11:00 -12:00   Participation Level: Active   Behavioral Response: CasualAlertDepressed   Type of Therapy: Group Therapy, OT   Treatment Goals addressed: Coping   Interventions: Psychosocial skills training, Supportive,    Summary:  Occupational Therapy group   Therapist Response: Patient engaged in group. See OT note.          Session Time: 12:00 - 12:45  Participation Level: Active  Behavioral Response: CasualAlertDepressed  Type of Therapy: Group Therapy  Treatment Goals addressed: Coping  Interventions: Systems analyst, Supportive  Summary:  Reflection Group: Patients encouraged to practice skills and interpersonal techniques or work on mindfulness and relaxation techniques. The importance of self-care and making skills part of a routine to increase usage were stressed   Therapist  Response: Patient engaged and participated appropriately.       Session Time: 12:45- 2:00  Participation Level: Active  Behavioral Response: CasualAlertDepressed  Type of Therapy: Group Therapy, Psychoeducation; Psychotherapy  Treatment Goals addressed: Coping  Interventions: CBT; Solution focused; Supportive; Reframing  Summary: 1:00 - 1:50: Clinician introduced topic of self esteem. Cln provided psycho-education on the roots of healthy/unhealthy self esteem. Group reviewed CBT based model for altering core beliefs and addressing the maladaptive rules stemming from low self esteem. Group completed examples from both models.  1:50 -2:00 Clinician led check-out. Clinician assessed for immediate needs, medication compliance and efficacy, and safety concerns   Therapist Response: Patient engaged in activity and discussion. Pt identifies their self esteem as low currently. Pt completed examples for altering core beliefs and maladaptive behaviors and chose not to share with the group.  [name], patient rates her mood at a 6 on a scale of 1-10 with 10 being great. Patient reports afternoon plans of picking up prescriptions and doing the "normal routine with the kids." Patient demonstrates some progress as evidenced by increased ability to reframe negative thinking about herself. Patient denies SI/HI/self-harm at the end of group.    Suicidal/Homicidal: Nowithout intent/plan   Plan: Pt will discharge from PHP due to meeting treatment goals of decreased depression symptoms and increased coping ability. Pt will step down to IOP within this agency tomorrow, 07/12/18. Pt and provider are aligned with discharge. Pt denies SI/HI/AVH at discharge.    Diagnosis: Severe recurrent major depression without psychotic features (Michele Rojas) [F33.2]    1. Severe recurrent major depression without psychotic features [facility])     Lorin Glass, LCSW, LCAS 07/24/2018

## 2018-07-24 NOTE — Progress Notes (Signed)
    Daily Group Progress Note  Program: IOP  Group Time: 9am-12pm  Participation Level: Active  Behavioral Response: Appropriate  Type of Therapy:  Group Therapy; psycho-educational group, process group  Summary of Progress:  The purpose of this group is to utilize CBT and DBT skills in a group setting to increase use of healthy coping skills and decrease frequency and intensity of active mental health symptoms  9am-9:15am: Clinician checked in with group members, assessing for SI/HI/psychosis and overall level of functioning. Clinician inquired about completed self care activities and skills practiced over the weekend.  9:15am-10:30am Group co-facilitated by pharmacist. Clients were provided with time to answer medication related questions. Pharmacist provided pscyho-educational information related to classes of medications, uses, and side effects.  10:30am-12pm: Clinician presented the topic of Perfectionism. Clinician and group members processed what perfectionism is, how it can look, and how it can effect vulnerability and mental health symptoms. Clinician and group members processed the effects of self criticism and unhelpful thinking, reviewing distorted thinking styles. Clinician prompted clients to name a positive self trait and an accomplishment from the past month. Clinician encouraged clients to give credit to things which previously seemed small, focusing on changes in level of functioning and progress rather than perfection. Clinician reviewed Thought Diaries and use in investigating accuracy and helpfulness of negative thoughts and develop new, more balanced thoughts. Clinician provided group members the opportunity to practice and provide support in group session with common unhelpful thoughts. Client participated in group discussions. Client was somewhat receptive to feedback from clinician and was supportive of other group members. Client is improving on identifying unhelpful  thoughts but struggling to implement healthy thoughts and believe positive self talk. Client was able to identify improvements in relationship with her husband and children, as well as attending group as accomplishments.  Olegario Messier, LCSW

## 2018-07-25 ENCOUNTER — Other Ambulatory Visit (HOSPITAL_COMMUNITY): Payer: 59 | Admitting: Licensed Clinical Social Worker

## 2018-07-25 DIAGNOSIS — F332 Major depressive disorder, recurrent severe without psychotic features: Secondary | ICD-10-CM | POA: Diagnosis not present

## 2018-07-25 NOTE — Progress Notes (Signed)
    Daily Group Progress Note  Program: IOP  Group Time: 9am-12pm  Participation Level: Active  Behavioral Response: Appropriate and Motivated  Type of Therapy:  Group Therapy; psycho-educational group, process group  Summary of Progress:  The purpose of this group is to utilize CBT and DBT skills in a group setting to increase use of healthy coping skills and decrease frequency and intensity of active mental health symptoms.  9am-11am Clinician and group members discussed rules and expectations for group services. Clinician inquired about client strengths and reviewed the importance of ongoing positive self talk as a response to negative automatic thoughts. Clinician praised progress seen in group and group members supported each other with progress an positive traits observed. Clinician and group members discussed the importance of vulnerability and human connection to help improve mental health symptoms and building support system. Clinician presented activity focused on self reflection and building self confidence. Clinician actively listened to group members and validated thoughts and feelings. Clinician offered progressive muscle relaxation as a skill to manage anxiety levels as well as focusing on gratitude in difficult situations.  11am-12pm Chaplin facilitated process group related to grief and loss. Group processed where and how they learned expressing feelings as well as what types of losses have been experienced. Client participated in all group discussions and activities. Client is supportive of other gorup members. Client reports she can logically identify when thoughts are unhelpful however struggles to believe alternate, realistic and more comfortable thoughts. Client shared in grief discussion sadness about the loss of support she expected from her husband, as well as her expectation about being the 'perfect' mother.  Olegario Messier, LCSW

## 2018-07-26 ENCOUNTER — Other Ambulatory Visit (HOSPITAL_COMMUNITY): Payer: 59 | Admitting: Psychiatry

## 2018-07-26 ENCOUNTER — Telehealth (HOSPITAL_COMMUNITY): Payer: Self-pay | Admitting: Psychiatry

## 2018-07-26 NOTE — Telephone Encounter (Signed)
D:  Pt excused due to an appt today, in which she had informed writer about previously.  A:  Inform treatment team.

## 2018-07-27 ENCOUNTER — Other Ambulatory Visit (HOSPITAL_COMMUNITY): Payer: Self-pay | Admitting: Family

## 2018-07-27 ENCOUNTER — Other Ambulatory Visit (HOSPITAL_COMMUNITY): Payer: 59 | Admitting: Family

## 2018-07-27 ENCOUNTER — Encounter (HOSPITAL_COMMUNITY): Payer: Self-pay | Admitting: Family

## 2018-07-27 DIAGNOSIS — R4589 Other symptoms and signs involving emotional state: Secondary | ICD-10-CM

## 2018-07-27 DIAGNOSIS — F332 Major depressive disorder, recurrent severe without psychotic features: Secondary | ICD-10-CM

## 2018-07-27 NOTE — Progress Notes (Addendum)
  [facility] Discharge Summary  Michele Rojas [id number]  Admission date: 07/12/2018 Discharge date: 07/27/2018  Reason for admission: Per assessment note from partial hospitalization Michele Rojas [name] presents with attempted suicide by overdose.Patient reports recent discharge from [place] inpatient admission. Patient reports multiple stressors surrounding her marriage and financial situation. Patient reports a few years ago she had affair and her husband found out however they are currently working through their marriage.Patient reports any little verbal altercations cause her to feel worse about her self. State that morning was the" last straw".Patient reports feelings of guilt which is why she attempted to overdose on Xanax and Klonopin.Reports this is her first inpatient admission. Denies history ofprevious attempts or plan. Denies history of selfinjuresbehaviors. 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 Patient is requesting to be restarted on Adderall. Education provided with recent suicidal attempt and initiating controlled substance.Patient appeared to be agreeable to plan.Discussed initiating trazodone 50 mg p.o. nightly for insomnia reported symptoms.patient was enrolled in partial psychiatric program on 06/21/18.  Chemical Use History: Was denied, however during her attendance to partial hospitalization program in intensive outpatient programming patient expressed concerns with being restarted on benzodiazepines and or Adderall for ADHD.  Patient reports primary provider initiated [place] PM  medcaiton.    Family of Origin Issues: -Reported consideration to initiate marital counseling-in progress  Progress in Program Toward Treatment Goals: Ongoing, [name] attended and participated with daily group session but active and engaged participation.  Patient recently completed  partial hospitalization program and intensive outpatient programming. Michele Rojas was not evaluated by this NP at discharge, however was provided with additional resources for aftercare follow-up.  No medication refills indicated.  Progress (rationale): Patient to follow-up with Michele Shams, NP 07/2018 and Michele Rojas, LPC-patient was initiated on Seroquel 25 mg p.o. nightly for mood stabilization while attending partial hospitalization program.    Take all medications as prescribed. Keep all follow-up appointments as scheduled.  Do not consume alcohol or use illegal drugs while on prescription medications. Report any adverse effects from your medications to your primary care provider promptly.  In the event of recurrent symptoms or worsening symptoms, call 911, a crisis hotline, or go to the nearest emergency department for evaluation.   Michele Center, NP 07/29/2018

## 2018-07-27 NOTE — Patient Instructions (Signed)
D:  Pt completed MH-IOP today.  A: Discharge today.  Follow up with Adolph Pollack, NP and Jeffie Pollock, [facility].  Pt will call and make appointments.  Encouraged support groups.  R:  Pt receptive.

## 2018-07-27 NOTE — Progress Notes (Signed)
    Daily Group Progress Note  Program: IOP  Group Time: 9am-12pm  Participation Level: Active  Behavioral Response: Appropriate and Motivated  Type of Therapy:  Group Therapy; process group, psycho-educational group  Summary of Progress:  The purpose of this group is to utilize CBT and DBT skills in a group setting to increase use of healthy coping skills and decrease intensity of active mental health symptoms.  9am-11am Clinician presented activity of dual self-portrait. Clinician and group members discussed differences in external and internal sense of self. Clinician utilize active listening and reflective statements to engage clients. Clinician presented the topic of Willingness vs Willfulness. Clinician and group members named thoughts or situations for both willing and willful mindsets.  Clinician presented the skill of Radical acceptance and reviewed with clients steps to implementing radical acceptance, as well as barriers to implementing skill. Clinician presented the skills of Half-Smiling and Willing Hands as ways to focus on accepting reality with your body.  11am-12pm Skills group facilitated utilizing yoga and breathing skills to focus on mindfulness and managing overwhelming emotions by staying in the moment. Client completes IOP on this day. Client reports what has been most helpful is learning to re-frame her thoughts and working on improving self esteem. See case management note for aftercare planning.  Olegario Messier, LCSW
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
## 2018-07-29 ENCOUNTER — Encounter (HOSPITAL_COMMUNITY): Payer: Self-pay | Admitting: Family

## 2018-08-03 ENCOUNTER — Other Ambulatory Visit (INDEPENDENT_AMBULATORY_CARE_PROVIDER_SITE_OTHER): Payer: Managed Care, Other (non HMO)

## 2018-08-03 DIAGNOSIS — R7401 Elevation of levels of liver transaminase levels: Secondary | ICD-10-CM

## 2018-08-03 DIAGNOSIS — E039 Hypothyroidism, unspecified: Secondary | ICD-10-CM | POA: Diagnosis not present

## 2018-08-03 DIAGNOSIS — R740 Nonspecific elevation of levels of transaminase and lactic acid dehydrogenase [LDH]: Secondary | ICD-10-CM | POA: Diagnosis not present

## 2018-08-03 LAB — TSH: TSH: 3.79 u[IU]/mL (ref 0.35–4.50)

## 2018-08-03 LAB — HEPATIC FUNCTION PANEL
ALK PHOS: 81 U/L (ref 39–117)
ALT: 29 U/L (ref 0–35)
AST: 21 U/L (ref 0–37)
Albumin: 4 g/dL (ref 3.5–5.2)
BILIRUBIN TOTAL: 0.5 mg/dL (ref 0.2–1.2)
Bilirubin, Direct: 0.1 mg/dL (ref 0.0–0.3)
Total Protein: 6.9 g/dL (ref 6.0–8.3)

## 2018-08-15 ENCOUNTER — Encounter: Payer: Self-pay | Admitting: Family Medicine

## 2018-08-15 ENCOUNTER — Ambulatory Visit: Payer: Managed Care, Other (non HMO) | Admitting: Family Medicine

## 2018-08-15 VITALS — BP 132/82 | HR 94 | Temp 98.5°F | Ht 67.0 in | Wt 199.0 lb

## 2018-08-15 DIAGNOSIS — R740 Nonspecific elevation of levels of transaminase and lactic acid dehydrogenase [LDH]: Secondary | ICD-10-CM

## 2018-08-15 DIAGNOSIS — I10 Essential (primary) hypertension: Secondary | ICD-10-CM

## 2018-08-15 DIAGNOSIS — F332 Major depressive disorder, recurrent severe without psychotic features: Secondary | ICD-10-CM

## 2018-08-15 DIAGNOSIS — K760 Fatty (change of) liver, not elsewhere classified: Secondary | ICD-10-CM

## 2018-08-15 DIAGNOSIS — R7401 Elevation of levels of liver transaminase levels: Secondary | ICD-10-CM

## 2018-08-15 DIAGNOSIS — F17200 Nicotine dependence, unspecified, uncomplicated: Secondary | ICD-10-CM | POA: Diagnosis not present

## 2018-08-15 DIAGNOSIS — F4323 Adjustment disorder with mixed anxiety and depressed mood: Secondary | ICD-10-CM

## 2018-08-15 DIAGNOSIS — E039 Hypothyroidism, unspecified: Secondary | ICD-10-CM | POA: Diagnosis not present

## 2018-08-15 NOTE — Assessment & Plan Note (Signed)
LFTs are back to normal with no etoh intake

## 2018-08-15 NOTE — Assessment & Plan Note (Signed)
Disc in detail risks of smoking and possible outcomes including copd, vascular/ heart disease, cancer , respiratory and sinus infections  Pt voices understanding Is cutting back  commended

## 2018-08-15 NOTE — Assessment & Plan Note (Signed)
Will reach out to her psychiatric practice re: running out of medicine and getting f/u appt

## 2018-08-15 NOTE — Assessment & Plan Note (Signed)
bp in fair control at this time  BP Readings from Last 1 Encounters:  08/15/18 132/82   No changes needed Most recent labs reviewed  Disc lifstyle change with low sodium diet and exercise

## 2018-08-15 NOTE — Assessment & Plan Note (Signed)
Seeing psychiatry and doing quite a bit better  Just about to run out of her trazodone and Jornay-very anx about being w/o it until her next appt She has been unable to reach her office about this  We will call/refer for f/u and let them know she is running out of medicine Reviewed stressors/ coping techniques/symptoms/ support sources/ tx options and side effects in detail today Disc imp of self care  If she could find time for exercise it would help  Good support-family

## 2018-08-15 NOTE — Assessment & Plan Note (Signed)
Hypothyroidism  Pt has no clinical changes No change in energy level/ hair or skin/ edema and no tremor Lab Results  Component Value Date   TSH 3.79 08/03/2018

## 2018-08-15 NOTE — Progress Notes (Signed)
Subjective:    Patient ID: Michele Rojas, female    DOB: 10-11-1985, 33 y.o.   MRN: [id number]  HPI Here for f/u of chronic medical problems   Wt Readings from Last 3 Encounters:  08/15/18 199 lb (90.3 kg)  06/29/18 204 lb 3.2 oz (92.6 kg)  06/15/18 191 lb (86.6 kg)   31.17 kg/m   Mental health status Psychiatric f/u  Had stay at [facility] and intensive outpt tx   Now taking jornay pm 60 mg (it is for ADD) - new time released med you take the night before  It helps    Psychiatrist - Marguarite Arbour ([place]) -- she cannot get a call back to get a medication refilled  Has appt Feb 12th ([facility])  Also takes seroquel prn- has not needed much  Takes trazodone for sleep  wellbutrin is the same   Feeling more like herself with current medicines  Sleeping better   Saw counselor through [facility]  Now sees Judithann Sheen at [facility] (planning to switch counselors)    Liver function inc last visit from etoh use Lab Results  Component Value Date   ALT 29 08/03/2018   AST 21 08/03/2018   ALKPHOS 81 08/03/2018   BILITOT 0.5 08/03/2018   this improved to normal a month later  No alcohol except a beer or two (no regular alcohol)   Thyroid  Hypothyroidism  Pt has no clinical changes No change in energy level/ hair or skin/ edema and no tremor Lab Results  Component Value Date   TSH 3.79 08/03/2018    Levothyroxine 50 mcg daily  Some improvement in energy level  No skin or hair problems   Smoking status-cutting back  1/2 ppd -doing better  Wants to smoke more if she is anxious   Some tea  No other caffeine  Drinking more water now   She was going to the gym before hospital  Wants to go back Has to arrange child care    Patient Active Problem List   Diagnosis Date Noted  . PTSD (post-traumatic stress disorder) 05/04/2018  . Intentional benzodiazepine overdose ([place]) 05/01/2018  . Severe recurrent major depression without psychotic features ([place])  05/01/2018  . H/O suicide attempt 05/01/2018  . GERD (gastroesophageal reflux disease) 02/13/2018  . Fatty liver 02/13/2018  . Lower abdominal pain 02/13/2018  . Weight gain, abnormal 07/28/2017  . Routine general medical examination at a health care facility 05/25/2016  . Lipoma of lower extremity 03/08/2016  . Hyperlipidemia 02/22/2016  . Leukocytosis 02/20/2016  . 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 Essential hypertension 01/07/2016  . Placental abruption 09/23/2015  . Encounter for biometric screening 09/30/2013  . Screening for lipoid disorders 09/28/2013  . Diabetes mellitus screening 09/28/2013  . Hypothyroid 11/04/2011  . Depression 10/04/2011  . HEADACHE 11/18/2009  . Adjustment disorder with mixed anxiety and depressed mood 11/17/2009  . FATIGUE 10/06/2009  . TOBACCO USE 02/19/2009  . CHICKENPOX, HX OF 02/19/2009  . DEFICIENCY OF OTHER VITAMINS 04/29/2008  . OTHER CONSTIPATION 04/29/2008   Past Medical History:  Diagnosis Date  . ADD (attention deficit disorder)    no meds  . Anxiety   . Constipation   . Depression   . Fatigue   . Fatty liver   . Gallstones   . GERD (gastroesophageal reflux disease)   . History of chicken pox as a child  . History of preterm delivery    2015- 31wks, 2017- 35wks  . HLD (  hyperlipidemia)   . HPV (human papilloma virus) infection   . HTN (hypertension)    No meds  . Hx of abnormal cervical Pap smear    ASCUS, LGSIL, CIN I, [place]  . Hypothyroidism    no meds  . Meckel's diverticulitis   . Mood swings   . Obesity   . Preeclampsia    2015 & 2017 pregnancies  . Right ovarian cyst    2.5cm  . Smoker   . Tobacco abuse   . Vitamin deficiency    Past Surgical History:  Procedure Laterality Date  . ACHILLES TENDON SURGERY Bilateral   . BONE MARROW BIOPSY  06/2017  . CESAREAN SECTION N/A 06/09/2014   Procedure: CESAREAN SECTION;  Surgeon: Farrel Gobble. Harrington Challenger, MD;  Location: [facility];  Service: Obstetrics;  Laterality: N/A;  .  CESAREAN SECTION N/A 09/22/2015   Procedure: CESAREAN SECTION;  Surgeon: Jerelyn Charles, MD;  Location: [facility];  Service: Obstetrics;  Laterality: N/A;  . CHOLECYSTECTOMY    . CHOLECYSTECTOMY, LAPAROSCOPIC    . DIAGNOSTIC LAPAROSCOPY     removal of meckels diverticulum  . LAPAROSCOPIC SMALL BOWEL RESECTION N/A 11/02/2017   Procedure: LAPAROSCOPIC REMOVAL OF MECKEL'S DIVERTICULUM ERAS PATHWAY;  Surgeon: Excell Seltzer, MD;  Location: [facility];  Service: General;  Laterality: N/A;  . TUBAL LIGATION    . WISDOM TOOTH EXTRACTION     Social History   Tobacco Use  . Smoking status: Current Every Day Smoker    Packs/day: 0.50    Years: 10.00    Pack years: 5.00    Types: Cigarettes  . Smokeless tobacco: Never Used  . Tobacco comment: Has cut back to 1/2 a pack and trying to quit  Substance Use Topics  . Alcohol use: Yes    Alcohol/week: 2.0 standard drinks    Types: 2 Cans of beer per week    Comment: rare  . Drug use: No   Family History  Problem Relation Age of Onset  . Hypertension Father   . Hyperlipidemia Father   . Prostate cancer Father   . Aneurysm Maternal Grandfather   . Depression Mother        after a car accident  . Colon polyps Mother   . Other Mother        ? CAD /heart disease  . Depression Brother        major depression  . Graves' disease Paternal Uncle   . Alzheimer's disease Paternal Grandmother   . Alzheimer's disease Paternal Grandfather   . Celiac disease Cousin        maternal cousin   Allergies  Allergen Reactions  . Amoxicillin Other (See Comments)    REACTION: yeast infection in mouth Has patient had a PCN reaction causing immediate rash, facial/tongue/throat swelling, SOB or lightheadedness with hypotension: No Has patient had a PCN reaction causing severe rash involving mucus membranes or skin necrosis: No Has patient had a PCN reaction that required hospitalization no Has patient had a PCN reaction occurring within the last 10 years: Yes If  all of the above answers are "NO", then may proceed with Cephalosporin use.   Current Outpatient Medications on File Prior to Visit  Medication Sig Dispense Refill  . buPROPion (WELLBUTRIN XL) 300 MG 24 hr tablet Take 1 tablet (300 mg total) by mouth daily. 30 tablet 0  . JORNAY PM 60 MG CP24 Take 60 mg by mouth daily.  0  . levothyroxine (SYNTHROID, LEVOTHROID) 50 MCG tablet Take 1 tablet (  50 mcg total) by mouth daily before breakfast. 30 tablet 11  . omeprazole (PRILOSEC OTC) 20 MG tablet Take 20 mg by mouth daily as needed.    Marland Kitchen QUEtiapine (SEROQUEL) 25 MG tablet Take 1 tablet (25 mg total) by mouth 2 (two) times daily as needed. 30 tablet 0  . traZODone (DESYREL) 100 MG tablet Take 1 tablet (100 mg total) by mouth at bedtime as needed for sleep. 60 tablet 0   No current facility-administered medications on file prior to visit.      Review of Systems  Constitutional: Positive for fatigue. Negative for activity change, appetite change, fever and unexpected weight change.  HENT: Negative for congestion, ear pain, rhinorrhea, sinus pressure and sore throat.   Eyes: Negative for pain, redness and visual disturbance.  Respiratory: Negative for cough, shortness of breath and wheezing.   Cardiovascular: Negative for chest pain and palpitations.  Gastrointestinal: Negative for abdominal distention, abdominal pain, blood in stool, constipation and diarrhea.  Endocrine: Negative for polydipsia and polyuria.  Genitourinary: Negative for dysuria, frequency and urgency.  Musculoskeletal: Negative for arthralgias, back pain and myalgias.  Skin: Negative for pallor and rash.  Allergic/Immunologic: Negative for environmental allergies.  Neurological: Negative for dizziness, syncope and headaches.  Hematological: Negative for adenopathy. Does not bruise/bleed easily.  Psychiatric/Behavioral: Positive for dysphoric mood. Negative for decreased concentration, self-injury, sleep disturbance and suicidal  ideas. The patient is nervous/anxious.        Objective:   Physical Exam Constitutional:      General: She is not in acute distress.    Appearance: Normal appearance. She is well-developed. She is obese. She is not ill-appearing.  HENT:     Head: Normocephalic and atraumatic.     Mouth/Throat:     Mouth: Mucous membranes are moist.     Pharynx: Oropharynx is clear.  Eyes:     General: No scleral icterus.    Conjunctiva/sclera: Conjunctivae normal.     Pupils: Pupils are equal, round, and reactive to light.  Neck:     Musculoskeletal: Normal range of motion and neck supple.  Cardiovascular:     Rate and Rhythm: Regular rhythm. Tachycardia present.     Pulses: Normal pulses.     Heart sounds: Normal heart sounds. No murmur.  Pulmonary:     Effort: Pulmonary effort is normal. No respiratory distress.     Breath sounds: Normal breath sounds. No stridor. No wheezing, rhonchi or rales.     Comments: Diffusely distant bs  Abdominal:     General: Bowel sounds are normal. There is no distension.     Palpations: Abdomen is soft. There is no mass.     Tenderness: There is no abdominal tenderness. There is no guarding or rebound.  Lymphadenopathy:     Cervical: No cervical adenopathy.  Skin:    General: Skin is warm and dry.     Capillary Refill: Capillary refill takes less than 2 seconds.     Coloration: Skin is not pale.     Findings: No erythema or rash.  Neurological:     General: No focal deficit present.     Mental Status: She is alert. Mental status is at baseline.     Motor: No tremor.  Psychiatric:        Attention and Perception: She is attentive.        Mood and Affect: Mood is depressed.        Speech: Speech normal.  Behavior: Behavior normal.        Thought Content: Thought content normal.        Cognition and Memory: Cognition normal.     Comments: Seems generally fatigued and depressed (but improved from last visit)            Assessment & Plan:    Problem List Items Addressed This Visit      Cardiovascular and Mediastinum   Essential hypertension    bp in fair control at this time  BP Readings from Last 1 Encounters:  08/15/18 132/82   No changes needed Most recent labs reviewed  Disc lifstyle change with low sodium diet and exercise          Digestive   Fatty liver    LFTs are back to normal with no etoh intake         Endocrine   Hypothyroid    Hypothyroidism  Pt has no clinical changes No change in energy level/ hair or skin/ edema and no tremor Lab Results  Component Value Date   TSH 3.79 08/03/2018            Other   TOBACCO USE    Disc in detail risks of smoking and possible outcomes including copd, vascular/ heart disease, cancer , respiratory and sinus infections  Pt voices understanding Is cutting back  commended      Severe recurrent major depression without psychotic features ([place]) - Primary    Will reach out to her psychiatric practice re: running out of medicine and getting f/u appt      Relevant Orders   Ambulatory referral to Psychiatry   RESOLVED: Elevated transaminase level   Adjustment disorder with mixed anxiety and depressed mood    Seeing psychiatry and doing quite a bit better  Just about to run out of her trazodone and Jornay-very anx about being w/o it until her next appt She has been unable to reach her office about this  We will call/refer for f/u and let them know she is running out of medicine Reviewed stressors/ coping techniques/symptoms/ support sources/ tx options and side effects in detail today Disc imp of self care  If she could find time for exercise it would help  Good support-family        Relevant Orders   Ambulatory referral to Psychiatry

## 2018-08-15 NOTE — Patient Instructions (Signed)
We will do referral /call psychiatry office   Keep working on smoking less to quit   No change in your thyroid medicine Liver tests are back to normal   Think about exercise when you can fit it in

## 2018-10-20 ENCOUNTER — Encounter: Payer: Self-pay | Admitting: Family Medicine

## 2019-01-29 ENCOUNTER — Other Ambulatory Visit: Payer: Self-pay

## 2019-01-29 ENCOUNTER — Ambulatory Visit: Payer: Managed Care, Other (non HMO) | Admitting: Family Medicine

## 2019-01-29 ENCOUNTER — Encounter: Payer: Self-pay | Admitting: Family Medicine

## 2019-01-29 ENCOUNTER — Telehealth: Payer: Self-pay | Admitting: *Deleted

## 2019-01-29 VITALS — BP 132/80 | HR 97 | Temp 98.6°F | Resp 16 | Ht 67.0 in | Wt 201.5 lb

## 2019-01-29 DIAGNOSIS — Z9189 Other specified personal risk factors, not elsewhere classified: Secondary | ICD-10-CM | POA: Diagnosis not present

## 2019-01-29 DIAGNOSIS — S80869A Insect bite (nonvenomous), unspecified lower leg, initial encounter: Secondary | ICD-10-CM | POA: Diagnosis not present

## 2019-01-29 DIAGNOSIS — S40861A Insect bite (nonvenomous) of right upper arm, initial encounter: Secondary | ICD-10-CM | POA: Diagnosis not present

## 2019-01-29 DIAGNOSIS — Z20822 Contact with and (suspected) exposure to covid-19: Secondary | ICD-10-CM

## 2019-01-29 MED ORDER — DOXYCYCLINE HYCLATE 100 MG PO TABS: 100.0000 mg | ORAL_TABLET | Freq: Two times a day (BID) | ORAL | 0 refills | Status: AC

## 2019-01-29 NOTE — Patient Instructions (Signed)
#   Antibiotics  - Can start now or after Covid-19 testing  If you develop rash, worsening headache or any other worsening symptoms would recommend starting until test results are back.   If you develop shortness of breath or trouble breathing this could be more concerning for COVID-19

## 2019-01-29 NOTE — Telephone Encounter (Signed)
TC, left VM to return call to schedule testing appointment for covid10. Lab order entered.    Michele Rojas Female, 33 y.o., 02-Sep-1985 MRN:  [id number] Phone:  [phone] Michele Rojas) PCP:  Abner Greenspan, MD Coverage:  Cigna/Cigna Managed Message Received: Today Message Contents  Lesleigh Noe, MD  [facility] Testing Pool        Please consider Covid-19 testing   - Obesity, HTN  - now with new HA and fatigue

## 2019-01-29 NOTE — Telephone Encounter (Signed)
-----   Message from Lesleigh Noe, MD sent at 01/29/2019 12:34 PM EDT ----- Please consider Covid-19 testing  - Obesity, HTN - now with new HA and fatigue

## 2019-01-29 NOTE — Telephone Encounter (Signed)
2nd call.TC to patient. No answer. Left VM to return call for testing appointment.

## 2019-01-29 NOTE — Progress Notes (Signed)
Subjective:     Michele Rojas is a 33 y.o. female presenting for Insect Bite (several tick bites. Having headaches, fatigue, rash area around tick bite-right side of the body. )     HPI   #Tick Bites - initially not sure that it was a tick - bite was 1-2 weeks ago - unsure how long it was attached - location on the right axilla area - also has a bite on her leg which she is not sure was tick and it is swollen and irritated - lower leg spot is itchy - headaches and fatigue - started about 5 days ago - fatigue - started yesterday - feeling heavy - no muscle aches or pain - endorses mild abdominal pain and nausea - no fever/chills - no other rashes on the skin other than local areas - no know sick contact - no recent large events - has been practicing    Review of Systems See HPI  Social History   Tobacco Use  Smoking Status Current Every Day Smoker  . Packs/day: 0.50  . Years: 10.00  . Pack years: 5.00  . Types: Cigarettes  Smokeless Tobacco Never Used  Tobacco Comment   Has cut back to 1/2 a pack and trying to quit        Objective:    BP Readings from Last 3 Encounters:  01/29/19 132/80  08/15/18 132/82  06/29/18 130/80   Wt Readings from Last 3 Encounters:  01/29/19 201 lb 8 oz (91.4 kg)  08/15/18 199 lb (90.3 kg)  06/29/18 204 lb 3.2 oz (92.6 kg)    BP 132/80   Pulse 97   Temp 98.6 F (37 C)   Resp 16   Ht 5\' 7"  (1.702 m)   Wt 201 lb 8 oz (91.4 kg)   SpO2 98%   BMI 31.56 kg/m    Physical Exam Constitutional:      General: She is not in acute distress.    Appearance: She is well-developed. She is not diaphoretic.  HENT:     Right Ear: External ear normal.     Left Ear: External ear normal.     Nose: Nose normal.  Eyes:     Conjunctiva/sclera: Conjunctivae normal.  Neck:     Musculoskeletal: Neck supple.  Cardiovascular:     Rate and Rhythm: Normal rate and regular rhythm.     Heart sounds: No murmur.  Pulmonary:   Effort: Pulmonary effort is normal. No respiratory distress.     Breath sounds: Normal breath sounds. No wheezing.  Skin:    General: Skin is warm and dry.     Capillary Refill: Capillary refill takes less than 2 seconds.     Comments: Right side near the nipple line with erythematous lesion with central spot with raised with central dimple. Surrounding erythema. No target lesions. Left leg behind the knee with another papule with some ulceration and erythema.   Neurological:     Mental Status: She is alert. Mental status is at baseline.  Psychiatric:        Mood and Affect: Mood normal.        Behavior: Behavior normal.           Assessment & Plan:   Problem List Items Addressed This Visit    None    Visit Diagnoses    At high risk for tick borne illness    -  Primary   Relevant Medications   doxycycline (VIBRA-TABS) 100 MG tablet  Discussed with patient that given tick bite and now with HA and fatigue could be tick borne illness. However, given lack of distinguishing rash would recommend getting tested for Coronavirus to avoid prolonged Abx if unnecessary. Pt agreed to get tested, and will either start Abx now or wait for worsening symptoms. Shown picture of lyme disease rash and asked to call if she develops this as would need longer course of Abx.   Patient Instructions  # Antibiotics  - Can start now or after Covid-19 testing  If you develop rash, worsening headache or any other worsening symptoms would recommend starting until test results are back.   If you develop shortness of breath or trouble breathing this could be more concerning for COVID-19    Return if symptoms worsen or fail to improve.  Lesleigh Noe, MD

## 2019-02-13 ENCOUNTER — Other Ambulatory Visit: Payer: Self-pay

## 2019-02-13 ENCOUNTER — Other Ambulatory Visit (HOSPITAL_COMMUNITY)
Admission: RE | Admit: 2019-02-13 | Discharge: 2019-02-13 | Disposition: A | Discharge status: Home / Self Care | Payer: Managed Care, Other (non HMO) | Source: Ambulatory Visit | Attending: Family Medicine | Admitting: Family Medicine

## 2019-02-13 ENCOUNTER — Ambulatory Visit (INDEPENDENT_AMBULATORY_CARE_PROVIDER_SITE_OTHER): Payer: Managed Care, Other (non HMO) | Admitting: Family Medicine

## 2019-02-13 ENCOUNTER — Encounter: Payer: Self-pay | Admitting: Family Medicine

## 2019-02-13 VITALS — BP 144/80 | HR 90 | Temp 97.0°F | Ht 67.0 in | Wt 201.0 lb

## 2019-02-13 DIAGNOSIS — N920 Excessive and frequent menstruation with regular cycle: Secondary | ICD-10-CM | POA: Diagnosis not present

## 2019-02-13 DIAGNOSIS — F17200 Nicotine dependence, unspecified, uncomplicated: Secondary | ICD-10-CM | POA: Diagnosis not present

## 2019-02-13 DIAGNOSIS — Z01419 Encounter for gynecological examination (general) (routine) without abnormal findings: Secondary | ICD-10-CM | POA: Insufficient documentation

## 2019-02-13 NOTE — Assessment & Plan Note (Signed)
Gyn exam and pap done today with HPV screen  She would like to start OC for menses control but would need to quit smoking first

## 2019-02-13 NOTE — Assessment & Plan Note (Signed)
Pt is eager to try OC for this but she smokes Disc risk of clots  Plan is to quit and then call us to try Will also have to watch BP  Disc other opt with gyn if this does not work out like IUD or ablation  If she changes her mind and wants referral that is ok as well

## 2019-02-13 NOTE — Patient Instructions (Signed)
Work on quitting smoking  Once you are quit for at least 2 weeks call us to get you started on an oral contraceptive to help periods Ibuprofen helps- start it the day before menses starts if you can  If you would rather see gyn to discuss other option just let us know   Take care of yourself ! We will let you know when pap report comes back

## 2019-02-13 NOTE — Assessment & Plan Note (Signed)
Disc in detail risks of smoking and possible outcomes including copd, vascular/ heart disease, cancer , respiratory and sinus infections  Pt voices understanding  Pt plans on quitting with patches (she already has)

## 2019-02-13 NOTE — Progress Notes (Signed)
Subjective:    Patient ID: Michele Rojas, female    DOB: [birth year], 33 y.o.   MRN: [id number]  HPI Here to discuss contraception for menstrual control and also due for routine gyn exam   Wt Readings from Last 3 Encounters:  02/13/19 201 lb (91.2 kg)  01/29/19 201 lb 8 oz (91.4 kg)  08/15/18 199 lb (90.3 kg)   31.48 kg/m   Had BTL for contraception   Having difficult periods  Also mood changes   Menses-fairly regular  Last 4 days -very heavy -changing pad/tampon every hour or more  Passes some clots  A lot of cramps (worse on the R)  Takes ibuprofen or midol- helps a little   In the past OC helped   Last pap/exam with gyn 9/17  Has had HPV in the past No abn paps in the past   She had the mirena IUD in the past  Thinks she did well    Smoking status - around 1/2 ppd (sometimes more)  Has been a struggle for her  Wants to quit -has a px for some patches   Smokes less in the winter   Seeing psychiatry Mood is overall better on current program    Patient Active Problem List   Diagnosis Date Noted  . Visit for routine gyn exam 02/13/2019  . Heavy menses 02/13/2019  . PTSD (post-traumatic stress disorder) 05/04/2018  . Intentional benzodiazepine overdose ([place]) 05/01/2018  . Severe recurrent major depression without psychotic features ([place]) 05/01/2018  . H/O suicide attempt 05/01/2018  . GERD (gastroesophageal reflux disease) 02/13/2018  . Fatty liver 02/13/2018  . Lower abdominal pain 02/13/2018  . Weight gain, abnormal 07/28/2017  . Routine general medical examination at a health care facility 05/25/2016  . Lipoma of lower extremity 03/08/2016  . Hyperlipidemia 02/22/2016  . Leukocytosis 02/20/2016  . 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 Essential hypertension 01/07/2016  . Placental abruption 09/23/2015  . Encounter for biometric screening 09/30/2013  . Screening for lipoid disorders 09/28/2013  . Diabetes mellitus screening 09/28/2013  .  Hypothyroid 11/04/2011  . Depression 10/04/2011  . HEADACHE 11/18/2009  . Adjustment disorder with mixed anxiety and depressed mood 11/17/2009  . FATIGUE 10/06/2009  . TOBACCO USE 02/19/2009  . CHICKENPOX, HX OF 02/19/2009  . DEFICIENCY OF OTHER VITAMINS 04/29/2008  . OTHER CONSTIPATION 04/29/2008   Past Medical History:  Diagnosis Date  . ADD (attention deficit disorder)    no meds  . Anxiety   . Constipation   . Depression   . Fatigue   . Fatty liver   . Gallstones   . GERD (gastroesophageal reflux disease)   . History of chicken pox as a child  . History of preterm delivery    2015- 31wks, 2017- 35wks  . HLD (hyperlipidemia)   . HPV (human papilloma virus) infection   . HTN (hypertension)    No meds  . Hx of abnormal cervical Pap smear    ASCUS, LGSIL, CIN I, Colpo  . Hypothyroidism    no meds  . Meckel's diverticulitis   . Mood swings   . Obesity   . Preeclampsia    2015 & 2017 pregnancies  . Right ovarian cyst    2.5cm  . Smoker   . Tobacco abuse   . Vitamin deficiency    Past Surgical History:  Procedure Laterality Date  . ACHILLES TENDON SURGERY Bilateral   . BONE MARROW BIOPSY  06/2017  . CESAREAN SECTION N/A 06/09/2014  Procedure: CESAREAN SECTION;  Surgeon: Farrel Gobble. Harrington Challenger, MD;  Location: [facility];  Service: Obstetrics;  Laterality: N/A;  . CESAREAN SECTION N/A 09/22/2015   Procedure: CESAREAN SECTION;  Surgeon: Jerelyn Charles, MD;  Location: [facility];  Service: Obstetrics;  Laterality: N/A;  . CHOLECYSTECTOMY    . CHOLECYSTECTOMY, LAPAROSCOPIC    . DIAGNOSTIC LAPAROSCOPY     removal of meckels diverticulum  . LAPAROSCOPIC SMALL BOWEL RESECTION N/A 11/02/2017   Procedure: LAPAROSCOPIC REMOVAL OF MECKEL'S DIVERTICULUM ERAS PATHWAY;  Surgeon: Excell Seltzer, MD;  Location: [facility];  Service: General;  Laterality: N/A;  . TUBAL LIGATION    . WISDOM TOOTH EXTRACTION     Social History   Tobacco Use  . Smoking status: Current Every Day Smoker     Packs/day: 0.50    Years: 10.00    Pack years: 5.00    Types: Cigarettes  . Smokeless tobacco: Never Used  Substance Use Topics  . Alcohol use: Yes    Alcohol/week: 2.0 standard drinks    Types: 2 Cans of beer per week    Comment: rare  . Drug use: No   Family History  Problem Relation Age of Onset  . Hypertension Father   . Hyperlipidemia Father   . Prostate cancer Father   . Aneurysm Maternal Grandfather   . Depression Mother        after a car accident  . Colon polyps Mother   . Other Mother        ? CAD /heart disease  . Depression Brother        major depression  . Graves' disease Paternal Uncle   . Alzheimer's disease Paternal Grandmother   . Alzheimer's disease Paternal Grandfather   . Celiac disease Cousin        maternal cousin   Allergies  Allergen Reactions  . Amoxicillin Other (See Comments)    REACTION: yeast infection in mouth Has patient had a PCN reaction causing immediate rash, facial/tongue/throat swelling, SOB or lightheadedness with hypotension: No Has patient had a PCN reaction causing severe rash involving mucus membranes or skin necrosis: No Has patient had a PCN reaction that required hospitalization no Has patient had a PCN reaction occurring within the last 10 years: Yes If all of the above answers are "NO", then may proceed with Cephalosporin use.   Current Outpatient Medications on File Prior to Visit  Medication Sig Dispense Refill  . buPROPion (WELLBUTRIN XL) 300 MG 24 hr tablet Take 1 tablet (300 mg total) by mouth daily. 30 tablet 0  . JORNAY PM 80 MG CP24 TK 1 C PO 10 H PRIOR TO WAKING UP IN THE AM    . levothyroxine (SYNTHROID, LEVOTHROID) 50 MCG tablet Take 1 tablet (50 mcg total) by mouth daily before breakfast. 30 tablet 11  . omeprazole (PRILOSEC OTC) 20 MG tablet Take 20 mg by mouth daily as needed.    Marland Kitchen QUEtiapine (SEROQUEL) 25 MG tablet Take 1 tablet (25 mg total) by mouth 2 (two) times daily as needed. 30 tablet 0  . traZODone  (DESYREL) 100 MG tablet Take 1 tablet (100 mg total) by mouth at bedtime as needed for sleep. 60 tablet 0   No current facility-administered medications on file prior to visit.     Review of Systems  Constitutional: Negative for activity change, appetite change, fatigue, fever and unexpected weight change.  HENT: Negative for congestion, ear pain, rhinorrhea, sinus pressure and sore throat.   Eyes: Negative for pain, redness  and visual disturbance.  Respiratory: Negative for cough, shortness of breath and wheezing.   Cardiovascular: Negative for chest pain and palpitations.  Gastrointestinal: Negative for abdominal pain, blood in stool, constipation and diarrhea.  Endocrine: Negative for polydipsia and polyuria.  Genitourinary: Positive for menstrual problem and pelvic pain. Negative for dysuria, frequency, urgency and vaginal discharge.  Musculoskeletal: Negative for arthralgias, back pain and myalgias.  Skin: Negative for pallor and rash.  Allergic/Immunologic: Negative for environmental allergies.  Neurological: Negative for dizziness, syncope and headaches.  Hematological: Negative for adenopathy. Does not bruise/bleed easily.  Psychiatric/Behavioral: Positive for dysphoric mood. Negative for decreased concentration. The patient is not nervous/anxious.        Depression and anxiety are improved under psychiatric care       Objective:   Physical Exam Constitutional:      General: She is not in acute distress.    Appearance: Normal appearance. She is obese. She is not ill-appearing or diaphoretic.  HENT:     Head: Normocephalic and atraumatic.     Mouth/Throat:     Mouth: Mucous membranes are moist.     Pharynx: Oropharynx is clear.  Eyes:     Extraocular Movements: Extraocular movements intact.     Conjunctiva/sclera: Conjunctivae normal.     Pupils: Pupils are equal, round, and reactive to light.  Neck:     Musculoskeletal: Normal range of motion.     Vascular: No carotid  bruit.  Cardiovascular:     Rate and Rhythm: Regular rhythm. Tachycardia present.     Pulses: Normal pulses.     Heart sounds: Normal heart sounds. No murmur.  Pulmonary:     Effort: Pulmonary effort is normal. No respiratory distress.     Breath sounds: Normal breath sounds. No wheezing or rales.  Abdominal:     General: Abdomen is flat. Bowel sounds are normal. There is no distension.     Palpations: Abdomen is soft. There is no mass.     Tenderness: There is no abdominal tenderness.     Hernia: No hernia is present.  Genitourinary:    Comments: Breast exam: No mass, nodules, thickening, tenderness, bulging, retraction, inflamation, nipple discharge or skin changes noted.  No axillary or clavicular LA.             Anus appears normal w/o hemorrhoids or masses     External genitalia : nl appearance and hair distribution/no lesions     Urethral meatus : nl size, no lesions or prolapse     Urethra: no masses, tenderness or scarring    Bladder : no masses or tenderness     Vagina: nl general appearance, no discharge or  Lesions, no significant cystocele  or rectocele     Cervix: no lesions/ discharge or friability    Uterus: nl size, contour, position, and mobility (not fixed) , non tender    Adnexa : no masses, tenderness, enlargement or nodularity       Musculoskeletal:     Right lower leg: No edema.     Left lower leg: No edema.  Lymphadenopathy:     Cervical: No cervical adenopathy.  Skin:    General: Skin is warm and dry.     Coloration: Skin is not jaundiced or pale.     Findings: No erythema.     Comments: tanned  Neurological:     Mental Status: She is alert. Mental status is at baseline.     Gait: Gait normal.  Deep Tendon Reflexes: Reflexes normal.  Psychiatric:        Mood and Affect: Mood normal.           Assessment & Plan:   Problem List Items Addressed This Visit      Other   TOBACCO USE    Disc in detail risks of  smoking and possible outcomes including copd, vascular/ heart disease, cancer , respiratory and sinus infections  Pt voices understanding  Pt plans on quitting with patches (she already has)        Visit for routine gyn exam - Primary    Gyn exam and pap done today with HPV screen  She would like to start OC for menses control but would need to quit smoking first       Relevant Orders   Cytology - PAP([facility])   Heavy menses    Pt is eager to try OC for this but she smokes Disc risk of clots  Plan is to quit and then call us to try Will also have to watch BP  Disc other opt with gyn if this does not work out like IUD or ablation  If she changes her mind and wants referral that is ok as well

## 2019-02-14 LAB — CYTOLOGY - PAP
Diagnosis: NEGATIVE
HPV: NOT DETECTED

## 2020-12-04 ENCOUNTER — Ambulatory Visit: Payer: Managed Care, Other (non HMO) | Admitting: Primary Care

## 2020-12-04 ENCOUNTER — Other Ambulatory Visit: Payer: Self-pay

## 2020-12-04 ENCOUNTER — Encounter: Payer: Self-pay | Admitting: Primary Care

## 2020-12-04 VITALS — BP 162/108 | HR 99 | Temp 98.3°F | Ht 67.0 in | Wt 195.8 lb

## 2020-12-04 DIAGNOSIS — E039 Hypothyroidism, unspecified: Secondary | ICD-10-CM | POA: Diagnosis not present

## 2020-12-04 DIAGNOSIS — I10 Essential (primary) hypertension: Secondary | ICD-10-CM

## 2020-12-04 LAB — COMPREHENSIVE METABOLIC PANEL
ALT: 30 U/L (ref 0–35)
AST: 26 U/L (ref 0–37)
Albumin: 4.2 g/dL (ref 3.5–5.2)
Alkaline Phosphatase: 87 U/L (ref 39–117)
BUN: 5 mg/dL — ABNORMAL LOW (ref 6–23)
CO2: 31 mEq/L (ref 19–32)
Calcium: 9.6 mg/dL (ref 8.4–10.5)
Chloride: 100 mEq/L (ref 96–112)
Creatinine, Ser: 0.72 mg/dL (ref 0.40–1.20)
GFR: 109.66 mL/min (ref >60.00)
Glucose, Bld: 91 mg/dL (ref 70–99)
Potassium: 4.4 mEq/L (ref 3.5–5.1)
Sodium: 138 mEq/L (ref 135–145)
Total Bilirubin: 0.6 mg/dL (ref 0.2–1.2)
Total Protein: 7 g/dL (ref 6.0–8.3)

## 2020-12-04 LAB — LIPID PANEL
Cholesterol: 212 mg/dL — ABNORMAL HIGH (ref 0–200)
HDL: 30.4 mg/dL — ABNORMAL LOW (ref >39.00)
Total CHOL/HDL Ratio: 7
Triglycerides: 425 mg/dL — ABNORMAL HIGH (ref 0.0–149.0)

## 2020-12-04 LAB — CBC
HCT: 46.2 % — ABNORMAL HIGH (ref 36.0–46.0)
Hemoglobin: 15.5 g/dL — ABNORMAL HIGH (ref 12.0–15.0)
MCHC: 33.6 g/dL (ref 30.0–36.0)
MCV: 88.8 fl (ref 78.0–100.0)
Platelets: 219 10*3/uL (ref 150.0–400.0)
RBC: 5.2 Mil/uL — ABNORMAL HIGH (ref 3.87–5.11)
RDW: 14.5 % (ref 11.5–15.5)
WBC: 17.2 10*3/uL — ABNORMAL HIGH (ref 4.0–10.5)

## 2020-12-04 LAB — TSH: TSH: 3.52 u[IU]/mL (ref 0.35–4.50)

## 2020-12-04 LAB — LDL CHOLESTEROL, DIRECT: Direct LDL: 134 mg/dL

## 2020-12-04 LAB — T4, FREE: Free T4: 0.64 ng/dL (ref 0.60–1.60)

## 2020-12-04 MED ORDER — HYDROCHLOROTHIAZIDE 25 MG PO TABS: 25.0000 mg | ORAL_TABLET | Freq: Every day | ORAL | 0 refills | Status: DC

## 2020-12-04 NOTE — Progress Notes (Signed)
Subjective:    Patient ID: Michele Rojas, female    DOB: 1985/09/28, 35 y.o.   MRN: [id number]  HPI  Michele Rojas is a very pleasant 35 y.o. female patient of Dr. Glori Bickers with a history of hypertension, hypothyroidism, tobacco abuse, preeclampsia, fatty liver, depression, anxiety who presents today reporting elevated blood pressure readings.   About one week ago she began to experience symptoms of a "panic attack" which included shortness of breath, this lasted for about one hour. She doesn't typically get panic attacks, overall feels well managed on her current regimen for anxiety. She's checked her BP at home which is running 151/108, 142/111.  She denies headaches, blurred vision. She was once managed on HCTZ in 2017 postpartum for which she took for about one year, came off as readings were improved. She was admitted for preeclampsia during her first pregnancy, had to be admitted for two months for treatment.   She has a family history of heart disease, hypertension in mother and father. She endorses an unhealthy diet, mostly fast food and junk food. She has been off and on Adderall for several years, resumed about one year ago.   BP Readings from Last 3 Encounters:  12/04/20 (!) 162/108  02/13/19 (!) 144/80  01/29/19 132/80      Review of Systems  Eyes: Negative for visual disturbance.  Respiratory: Negative for shortness of breath.   Cardiovascular: Negative for chest pain.  Neurological: Negative for dizziness and headaches.         Past Medical History:  Diagnosis Date  . ADD (attention deficit disorder)    no meds  . Anxiety   . Constipation   . Depression   . Fatigue   . Fatty liver   . Gallstones   . GERD (gastroesophageal reflux disease)   . History of chicken pox as a child  . History of preterm delivery    2015- 31wks, 2017- 35wks  . HLD (hyperlipidemia)   . HPV (human papilloma virus) infection   . HTN (hypertension)    No meds  . Hx of  abnormal cervical Pap smear    ASCUS, LGSIL, CIN I, Colpo  . Hypothyroidism    no meds  . Meckel's diverticulitis   . Mood swings   . Obesity   . Preeclampsia    2015 & 2017 pregnancies  . Right ovarian cyst    2.5cm  . Smoker   . Tobacco abuse   . Vitamin deficiency     Social History   Socioeconomic History  . Marital status: Married    Spouse name: Not on file  . Number of children: 2  . Years of education: Not on file  . Highest education level: Not on file  Occupational History  . Occupation: Hmemaker  Tobacco Use  . Smoking status: Current Every Day Smoker    Packs/day: 0.50    Years: 10.00    Pack years: 5.00    Types: Cigarettes  . Smokeless tobacco: Never Used  Vaping Use  . Vaping Use: Former  Substance and Sexual Activity  . Alcohol use: Yes    Alcohol/week: 2.0 standard drinks    Types: 2 Cans of beer per week    Comment: rare  . Drug use: No  . Sexual activity: Yes    Partners: Male    Birth control/protection: Surgical  Other Topics Concern  . Not on file  Social History Narrative   GYN- Dr. 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 ACHILLES TENDON SURGERY Bilateral   . BONE MARROW BIOPSY  06/2017  . CESAREAN SECTION N/A 06/09/2014   Procedure: CESAREAN SECTION;  Surgeon: Farrel Gobble. Harrington Challenger, MD;  Location: [facility];  Service: Obstetrics;  Laterality: N/A;  . CESAREAN SECTION N/A 09/22/2015   Procedure: CESAREAN SECTION;  Surgeon: Jerelyn Charles, MD;  Location: [facility];  Service: Obstetrics;  Laterality: N/A;  . CHOLECYSTECTOMY    .  CHOLECYSTECTOMY, LAPAROSCOPIC    . DIAGNOSTIC LAPAROSCOPY     removal of meckels diverticulum  . LAPAROSCOPIC SMALL BOWEL RESECTION N/A 11/02/2017   Procedure: LAPAROSCOPIC REMOVAL OF MECKEL'S DIVERTICULUM ERAS PATHWAY;  Surgeon: Excell Seltzer, MD;  Location: [facility];  Service: General;  Laterality: N/A;  . TUBAL LIGATION    . WISDOM TOOTH EXTRACTION      Family History  Problem Relation Age of Onset  . Hypertension Father   . Hyperlipidemia Father   . Prostate cancer Father   . Aneurysm Maternal Grandfather   . Depression Mother        after a car accident  . Colon polyps Mother   . Other Mother        ? CAD /heart disease  . Depression Brother        major depression  . Graves' disease Paternal Uncle   . Alzheimer's disease Paternal Grandmother   . Alzheimer's disease Paternal Grandfather   . Celiac disease Cousin        maternal cousin    Allergies  Allergen Reactions  . Amoxicillin Other (See Comments)    REACTION: yeast infection in mouth Has patient had a PCN reaction causing immediate rash, facial/tongue/throat swelling, SOB or lightheadedness with hypotension: No Has patient had a PCN reaction causing severe rash involving mucus membranes or skin necrosis: No Has patient had a PCN reaction that required hospitalization no Has patient had a PCN reaction occurring within the last 10 years: Yes If all of the above answers are "NO", then may proceed with Cephalosporin use.    Current Outpatient Medications on File Prior to Visit  Medication Sig Dispense Refill  . amphetamine-dextroamphetamine (ADDERALL) 10 MG tablet Take 20 mg by mouth 3 (three) times daily.    Marland Kitchen buPROPion (WELLBUTRIN XL) 300 MG 24 hr tablet Take 1 tablet (300 mg total) by mouth daily. 30 tablet 0  . levonorgestrel (MIRENA, 52 MG,) 20 MCG/DAY IUD Mirena 20 mcg/24 hours (7 yrs) 52 mg intrauterine device  Take 1 device by intrauterine route.    Marland Kitchen PREVIDENT 5000 BOOSTER PLUS 1.1 % PSTE See admin  instructions.    . traZODone (DESYREL) 100 MG tablet Take 1 tablet (100 mg total) by mouth at bedtime as needed for sleep. 60 tablet 0   No current facility-administered medications on file prior to visit.    BP (!) 162/108 (BP Location: Right Arm, Patient Position: Sitting, Cuff Size: Small)   Pulse 99   Temp 98.3 F (36.8 C) (Temporal)   Ht '5\' 7"'  (1.702 m)  Wt 195 lb 12.8 oz (88.8 kg)   SpO2 98%   BMI 30.67 kg/m  Objective:   Physical Exam Cardiovascular:     Rate and Rhythm: Normal rate and regular rhythm.  Pulmonary:     Effort: Pulmonary effort is normal.     Breath sounds: Normal breath sounds.  Musculoskeletal:     Cervical back: Neck supple.  Skin:    General: Skin is warm and dry.           Assessment & Plan:      This visit occurred during the SARS-CoV-2 public health emergency.  Safety protocols were in place, including screening questions prior to the visit, additional usage of staff PPE, and extensive cleaning of exam room while observing appropriate contact time as indicated for disinfecting solutions.

## 2020-12-04 NOTE — Assessment & Plan Note (Signed)
Checking TSH and free T4 today. Not on levothyroxine.

## 2020-12-04 NOTE — Patient Instructions (Signed)
Start hydrochlorothiazide 25 mg once daily for blood pressure.  Continue to monitor blood pressure at home.  Please schedule a follow up visit to meet back with Dr. Glori Bickers in 2-3 weeks for blood pressure check.   It was a pleasure to see you today!

## 2020-12-04 NOTE — Assessment & Plan Note (Signed)
Above goal in the office today, also above goal in 2020 during last office visit. We checked her BP machine today which was reading 167/120.  Strong FH of heart disease and hypertension, poor diet, personal history of preeclampsia and post partum HTN.   Checking labs today to rule out other causes.  She is also managed on Adderal 20 mg TID which could be contributing. She is also a smoker.  Several variables that could all be contributing, nonetheless we need to treat. Rx for HCTZ 25 mg sent to pharmacy as she was managed on this previously.   We will plan to see her back in 2-3 weeks for BP check and BMP.

## 2020-12-16 ENCOUNTER — Ambulatory Visit: Payer: Managed Care, Other (non HMO) | Admitting: Family Medicine

## 2020-12-16 ENCOUNTER — Encounter: Payer: Self-pay | Admitting: Family Medicine

## 2020-12-16 ENCOUNTER — Other Ambulatory Visit: Payer: Self-pay

## 2020-12-16 VITALS — BP 138/80 | HR 92 | Temp 97.2°F | Ht 67.0 in | Wt 196.2 lb

## 2020-12-16 DIAGNOSIS — E7800 Pure hypercholesterolemia, unspecified: Secondary | ICD-10-CM

## 2020-12-16 DIAGNOSIS — I10 Essential (primary) hypertension: Secondary | ICD-10-CM | POA: Diagnosis not present

## 2020-12-16 DIAGNOSIS — D72829 Elevated white blood cell count, unspecified: Secondary | ICD-10-CM | POA: Diagnosis not present

## 2020-12-16 DIAGNOSIS — F17200 Nicotine dependence, unspecified, uncomplicated: Secondary | ICD-10-CM

## 2020-12-16 LAB — BASIC METABOLIC PANEL
BUN: 8 mg/dL (ref 6–23)
CO2: 30 mEq/L (ref 19–32)
Calcium: 9.3 mg/dL (ref 8.4–10.5)
Chloride: 98 mEq/L (ref 96–112)
Creatinine, Ser: 0.73 mg/dL (ref 0.40–1.20)
GFR: 107.83 mL/min (ref >60.00)
Glucose, Bld: 87 mg/dL (ref 70–99)
Potassium: 3.6 mEq/L (ref 3.5–5.1)
Sodium: 136 mEq/L (ref 135–145)

## 2020-12-16 NOTE — Assessment & Plan Note (Signed)
In pt with past hx of HTN in pregnancy Now taking hctz 25 mg daily and doing well  bp is improved bp in fair control at this time  BP Readings from Last 1 Encounters:  12/16/20 138/80   No changes needed Most recent labs reviewed  Disc lifstyle change with low sodium diet and exercise  Lab today  F/u 3 mo

## 2020-12-16 NOTE — Patient Instructions (Signed)
Continue the hctz Labs today   Keep working on time for exercise   Try to get most of your carbohydrates from produce (with the exception of white potatoes)  Eat less bread/pasta/rice/snack foods/cereals/sweets and other items from the middle of the grocery store (processed carbs)  Follow up in 3 months

## 2020-12-16 NOTE — Progress Notes (Signed)
Subjective:    Patient ID: Michele Rojas, female    DOB: Feb 05, 1986, 35 y.o.   MRN: [id number]  This visit occurred during the SARS-CoV-2 public health emergency.  Safety protocols were in place, including screening questions prior to the visit, additional usage of staff PPE, and extensive cleaning of exam room while observing appropriate contact time as indicated for disinfecting solutions.    HPI Pt presents for follow up of HTN  Wt Readings from Last 3 Encounters:  12/16/20 196 lb 3 oz (89 kg)  12/04/20 195 lb 12.8 oz (88.8 kg)  02/13/19 201 lb (91.2 kg)   30.73 kg/m  Pt had a visit with NP Clark on 4/28  (noted elevated after a panic attacks)  BP was elevated Px hctz 25 mg daily  Discussed lifestyle change   BP Readings from Last 3 Encounters:  12/16/20 138/80  12/04/20 (!) 162/108  02/13/19 (!) 144/80    Of note-h/o pre eclampsia with one pregnancy   Drinking more water now  Not checking bp regularly at home   Eating more healthy as well  Exercise - 30 minutes every other day    fam hx of CAD and  HTN in both parents   Pulse Readings from Last 3 Encounters:  12/16/20 92  12/04/20 99  02/13/19 90     Lab Results  Component Value Date   CREATININE 0.72 12/04/2020   BUN 5 (L) 12/04/2020   NA 138 12/04/2020   K 4.4 12/04/2020   CL 100 12/04/2020   CO2 31 12/04/2020   Lab Results  Component Value Date   WBC 17.2 (H) 12/04/2020   HGB 15.5 (H) 12/04/2020   HCT 46.2 (H) 12/04/2020   MCV 88.8 12/04/2020   PLT 219.0 12/04/2020    Has seen hematology    Lab Results  Component Value Date   TSH 3.52 12/04/2020    Lab Results  Component Value Date   CHOL 212 (H) 12/04/2020   HDL 30.40 (L) 12/04/2020   LDLCALC 107 (H) 05/04/2018   LDLDIRECT 134.0 12/04/2020   TRIG (H) 12/04/2020    425.0 Triglyceride is over 400; calculations on Lipids are invalid.   CHOLHDL 7 12/04/2020  non fasting  Not a lot of fried foods  Eats too much pasta    Smoking status  1/2 to 3/4 ppd  Thinking more about cessation   Patient Active Problem List   Diagnosis Date Noted  . Visit for routine gyn exam 02/13/2019  . Heavy menses 02/13/2019  . PTSD (post-traumatic stress disorder) 05/04/2018  . Intentional benzodiazepine overdose ([place]) 05/01/2018  . Severe recurrent major depression without psychotic features ([place]) 05/01/2018  . H/O suicide attempt 05/01/2018  . GERD (gastroesophageal reflux disease) 02/13/2018  . Fatty liver 02/13/2018  . Lower abdominal pain 02/13/2018  . Weight gain, abnormal 07/28/2017  . Routine general medical examination at a health care facility 05/25/2016  . Lipoma of lower extremity 03/08/2016  . Hyperlipidemia 02/22/2016  . Leukocytosis 02/20/2016  . 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 Essential hypertension 01/07/2016  . Placental abruption 09/23/2015  . Encounter for biometric screening 09/30/2013  . Screening for lipoid disorders 09/28/2013  . Diabetes mellitus screening 09/28/2013  . Hypothyroid 11/04/2011  . Depression 10/04/2011  . HEADACHE 11/18/2009  . Adjustment disorder with mixed anxiety and depressed mood 11/17/2009  . FATIGUE 10/06/2009  . TOBACCO USE 02/19/2009  . CHICKENPOX, HX OF 02/19/2009  . DEFICIENCY OF OTHER VITAMINS 04/29/2008  . OTHER CONSTIPATION  04/29/2008   Past Medical History:  Diagnosis Date  . ADD (attention deficit disorder)    no meds  . Anxiety   . Constipation   . Depression   . Fatigue   . Fatty liver   . Gallstones   . GERD (gastroesophageal reflux disease)   . History of chicken pox as a child  . History of preterm delivery    2015- 31wks, 2017- 35wks  . HLD (hyperlipidemia)   . HPV (human papilloma virus) infection   . HTN (hypertension)    No meds  . Hx of abnormal cervical Pap smear    ASCUS, LGSIL, CIN I, Colpo  . Hypothyroidism    no meds  . Meckel's diverticulitis   . Mood swings   . Obesity   . Preeclampsia    2015 & 2017 pregnancies   . Right ovarian cyst    2.5cm  . Smoker   . Tobacco abuse   . Vitamin deficiency    Past Surgical History:  Procedure Laterality Date  . ACHILLES TENDON SURGERY Bilateral   . BONE MARROW BIOPSY  06/2017  . CESAREAN SECTION N/A 06/09/2014   Procedure: CESAREAN SECTION;  Surgeon: Farrel Gobble. Harrington Challenger, MD;  Location: [facility];  Service: Obstetrics;  Laterality: N/A;  . CESAREAN SECTION N/A 09/22/2015   Procedure: CESAREAN SECTION;  Surgeon: Jerelyn Charles, MD;  Location: [facility];  Service: Obstetrics;  Laterality: N/A;  . CHOLECYSTECTOMY    . CHOLECYSTECTOMY, LAPAROSCOPIC    . DIAGNOSTIC LAPAROSCOPY     removal of meckels diverticulum  . LAPAROSCOPIC SMALL BOWEL RESECTION N/A 11/02/2017   Procedure: LAPAROSCOPIC REMOVAL OF MECKEL'S DIVERTICULUM ERAS PATHWAY;  Surgeon: Excell Seltzer, MD;  Location: [facility];  Service: General;  Laterality: N/A;  . TUBAL LIGATION    . WISDOM TOOTH EXTRACTION     Social History   Tobacco Use  . Smoking status: Current Every Day Smoker    Packs/day: 0.50    Years: 10.00    Pack years: 5.00    Types: Cigarettes  . Smokeless tobacco: Never Used  Vaping Use  . Vaping Use: Former  Substance Use Topics  . Alcohol use: Yes    Alcohol/week: 2.0 standard drinks    Types: 2 Cans of beer per week    Comment: rare  . Drug use: No   Family History  Problem Relation Age of Onset  . Hypertension Father   . Hyperlipidemia Father   . Prostate cancer Father   . Aneurysm Maternal Grandfather   . Depression Mother        after a car accident  . Colon polyps Mother   . Other Mother        ? CAD /heart disease  . Depression Brother        major depression  . Graves' disease Paternal Uncle   . Alzheimer's disease Paternal Grandmother   . Alzheimer's disease Paternal Grandfather   . Celiac disease Cousin        maternal cousin   Allergies  Allergen Reactions  . Amoxicillin Other (See Comments)    REACTION: yeast infection in mouth Has patient had a PCN  reaction causing immediate rash, facial/tongue/throat swelling, SOB or lightheadedness with hypotension: No Has patient had a PCN reaction causing severe rash involving mucus membranes or skin necrosis: No Has patient had a PCN reaction that required hospitalization no Has patient had a PCN reaction occurring within the last 10 years: Yes If all of the above answers are "  NO", then may proceed with Cephalosporin use.   Current Outpatient Medications on File Prior to Visit  Medication Sig Dispense Refill  . amphetamine-dextroamphetamine (ADDERALL) 10 MG tablet Take 20 mg by mouth 3 (three) times daily.    Marland Kitchen buPROPion (WELLBUTRIN XL) 300 MG 24 hr tablet Take 1 tablet (300 mg total) by mouth daily. 30 tablet 0  . hydrochlorothiazide (HYDRODIURIL) 25 MG tablet Take 1 tablet (25 mg total) by mouth daily. For blood pressure. 30 tablet 0  . PREVIDENT 5000 BOOSTER PLUS 1.1 % PSTE See admin instructions.    . traZODone (DESYREL) 100 MG tablet Take 1 tablet (100 mg total) by mouth at bedtime as needed for sleep. 60 tablet 0   No current facility-administered medications on file prior to visit.    Review of Systems  Constitutional: Positive for fatigue. Negative for activity change, appetite change, fever and unexpected weight change.  HENT: Negative for congestion, ear pain, rhinorrhea, sinus pressure and sore throat.   Eyes: Negative for pain, redness and visual disturbance.  Respiratory: Negative for cough, shortness of breath and wheezing.   Cardiovascular: Negative for chest pain and palpitations.  Gastrointestinal: Negative for abdominal pain, blood in stool, constipation and diarrhea.  Endocrine: Negative for polydipsia and polyuria.  Genitourinary: Negative for dysuria, frequency and urgency.  Musculoskeletal: Negative for arthralgias, back pain and myalgias.  Skin: Negative for pallor and rash.  Allergic/Immunologic: Negative for environmental allergies.  Neurological: Negative for  dizziness, syncope and headaches.  Hematological: Negative for adenopathy. Does not bruise/bleed easily.  Psychiatric/Behavioral: Negative for decreased concentration and dysphoric mood. The patient is not nervous/anxious.        Objective:   Physical Exam Constitutional:      General: She is not in acute distress.    Appearance: Normal appearance. She is well-developed. She is obese.  HENT:     Head: Normocephalic and atraumatic.  Eyes:     Conjunctiva/sclera: Conjunctivae normal.     Pupils: Pupils are equal, round, and reactive to light.  Neck:     Thyroid: No thyromegaly.     Vascular: No carotid bruit or JVD.  Cardiovascular:     Rate and Rhythm: Normal rate and regular rhythm.     Heart sounds: Normal heart sounds. No gallop.   Pulmonary:     Effort: Pulmonary effort is normal. No respiratory distress.     Breath sounds: Normal breath sounds. No wheezing or rales.  Abdominal:     General: Bowel sounds are normal. There is no distension or abdominal bruit.     Palpations: Abdomen is soft. There is no mass.     Tenderness: There is no abdominal tenderness.  Musculoskeletal:     Cervical back: Normal range of motion and neck supple.     Right lower leg: No edema.     Left lower leg: No edema.  Lymphadenopathy:     Cervical: No cervical adenopathy.  Skin:    General: Skin is warm and dry.     Coloration: Skin is not pale.     Findings: No erythema or rash.  Neurological:     Mental Status: She is alert.     Coordination: Coordination normal.     Deep Tendon Reflexes: Reflexes are normal and symmetric. Reflexes normal.  Psychiatric:        Mood and Affect: Mood normal.           Assessment & Plan:   Problem List Items Addressed This Visit  Cardiovascular and Mediastinum   Essential hypertension - Primary    In pt with past hx of HTN in pregnancy Now taking hctz 25 mg daily and doing well  bp is improved bp in fair control at this time  BP Readings  from Last 1 Encounters:  12/16/20 138/80   No changes needed Most recent labs reviewed  Disc lifstyle change with low sodium diet and exercise  Lab today  F/u 3 mo      Relevant Orders   Basic metabolic panel (Completed)     Other   TOBACCO USE    Disc in detail risks of smoking and possible outcomes including copd, vascular/ heart disease, cancer , respiratory and sinus infections  Pt voices understanding   She is thinking more about cessation        Leukocytosis   Hyperlipidemia    Noted elevated lipids last time (triglycerides were 425 non fasting) Now starting to work on diet/exercise  Disc goals for lipids and reasons to control them Rev last labs with pt Rev low sat fat diet in detail Will plan to re check this at f/u in 3 mo

## 2020-12-16 NOTE — Assessment & Plan Note (Signed)
Disc in detail risks of smoking and possible outcomes including copd, vascular/ heart disease, cancer , respiratory and sinus infections  Pt voices understanding   She is thinking more about cessation

## 2020-12-16 NOTE — Assessment & Plan Note (Addendum)
Noted elevated lipids last time (triglycerides were 425 non fasting) Now starting to work on diet/exercise  Disc goals for lipids and reasons to control them Rev last labs with pt Rev low sat fat diet in detail Will plan to re check this at f/u in 3 mo

## 2020-12-28 ENCOUNTER — Other Ambulatory Visit: Payer: Self-pay | Admitting: Primary Care

## 2020-12-28 DIAGNOSIS — I10 Essential (primary) hypertension: Secondary | ICD-10-CM

## 2021-03-19 ENCOUNTER — Ambulatory Visit: Payer: Managed Care, Other (non HMO) | Admitting: Family Medicine

## 2021-03-24 ENCOUNTER — Other Ambulatory Visit: Payer: Self-pay

## 2021-03-24 ENCOUNTER — Encounter: Payer: Self-pay | Admitting: Family Medicine

## 2021-03-24 ENCOUNTER — Ambulatory Visit: Payer: Managed Care, Other (non HMO) | Admitting: Family Medicine

## 2021-03-24 VITALS — BP 140/88 | HR 60 | Temp 98.7°F | Ht 67.0 in | Wt 189.5 lb

## 2021-03-24 DIAGNOSIS — E7800 Pure hypercholesterolemia, unspecified: Secondary | ICD-10-CM

## 2021-03-24 DIAGNOSIS — I10 Essential (primary) hypertension: Secondary | ICD-10-CM | POA: Diagnosis not present

## 2021-03-24 DIAGNOSIS — F17200 Nicotine dependence, unspecified, uncomplicated: Secondary | ICD-10-CM

## 2021-03-24 NOTE — Progress Notes (Signed)
Subjective:    Patient ID: Michele Rojas, female    DOB: 06-23-1986, 35 y.o.   MRN: [id number]  This visit occurred during the SARS-CoV-2 public health emergency.  Safety protocols were in place, including screening questions prior to the visit, additional usage of staff PPE, and extensive cleaning of exam room while observing appropriate contact time as indicated for disinfecting solutions.   HPI Pt presents for f/u of HTN  Wt Readings from Last 3 Encounters:  03/24/21 189 lb 8 oz (86 kg)  12/16/20 196 lb 3 oz (89 kg)  12/04/20 195 lb 12.8 oz (88.8 kg)   29.68 kg/m  Tying to get ready for school for her kids (kindergarten)   HTN bp is stable today  No cp or palpitations or headaches or edema  No side effects to medicines  BP Readings from Last 3 Encounters:  03/24/21 140/88  12/16/20 138/80  12/04/20 (!) 162/108    Pulse Readings from Last 3 Encounters:  03/24/21 60  12/16/20 92  12/04/20 99   Does not check at home  Feeling fine for the most part/just a little tired    Taking hctz 25 mg daily  Disc low sodium diet and exercise at last visit  Lab Results  Component Value Date   CREATININE 0.73 12/16/2020   BUN 8 12/16/2020   NA 136 12/16/2020   K 3.6 12/16/2020   CL 98 12/16/2020   CO2 30 12/16/2020    Takes adderall as well from psychiatry   Doing well psych wise   Smokes daily - about the same     Hypothyroidism  Pt has no clinical changes, not taking medications No change in energy level/ hair or skin/ edema and no tremor Lab Results  Component Value Date   TSH 3.52 12/04/2020  Free T4 of 0.64 Both nl w/o thyroid supplementation   Triglycerides were high in April Lab Results  Component Value Date   CHOL 212 (H) 12/04/2020   HDL 30.40 (L) 12/04/2020   LDLCALC 107 (H) 05/04/2018   LDLDIRECT 134.0 12/04/2020   TRIG (H) 12/04/2020    425.0 Triglyceride is over 400; calculations on Lipids are invalid.   CHOLHDL 7 12/04/2020   Was  not fasting  Some burgers  Not fried foods   Patient Active Problem List   Diagnosis Date Noted   Visit for routine gyn exam 02/13/2019   Heavy menses 02/13/2019   PTSD (post-traumatic stress disorder) 05/04/2018   History of drug overdose 05/01/2018   Severe recurrent major depression without psychotic features ([place]) 05/01/2018   H/O suicide attempt 05/01/2018   GERD (gastroesophageal reflux disease) 02/13/2018   Fatty liver 02/13/2018   Lower abdominal pain 02/13/2018   Weight gain, abnormal 07/28/2017   Routine general medical examination at a health care facility 05/25/2016   Lipoma of lower extremity 03/08/2016   Hyperlipidemia 02/22/2016   Leukocytosis 02/20/2016   Post partum depression 01/08/2016   Essential hypertension 01/07/2016   Encounter for biometric screening 09/30/2013   Diabetes mellitus screening 09/28/2013   Hypothyroid 11/04/2011   Depression 10/04/2011   HEADACHE 11/18/2009   Adjustment disorder with mixed anxiety and depressed mood 11/17/2009   FATIGUE 10/06/2009   TOBACCO USE 02/19/2009   CHICKENPOX, HX OF 02/19/2009   DEFICIENCY OF OTHER VITAMINS 04/29/2008   OTHER CONSTIPATION 04/29/2008   Past Medical History:  Diagnosis Date   ADD (attention deficit disorder)    no meds   Anxiety    Constipation  Depression    Fatigue    Fatty liver    Gallstones    GERD (gastroesophageal reflux disease)    History of chicken pox as a child   History of preterm delivery    2015- 31wks, 2017- 35wks   HLD (hyperlipidemia)    HPV (human papilloma virus) infection    HTN (hypertension)    No meds   Hx of abnormal cervical Pap smear    ASCUS, LGSIL, CIN I, Colpo   Hypothyroidism    no meds   Meckel's diverticulitis    Mood swings    Obesity    Preeclampsia    2015 & 2017 pregnancies   Right ovarian cyst    2.5cm   Smoker    Tobacco abuse    Vitamin deficiency    Past Surgical History:  Procedure Laterality Date   ACHILLES TENDON SURGERY  Bilateral    BONE MARROW BIOPSY  06/2017   CESAREAN SECTION N/A 06/09/2014   Procedure: CESAREAN SECTION;  Surgeon: Farrel Gobble. Harrington Challenger, MD;  Location: [facility];  Service: Obstetrics;  Laterality: N/A;   CESAREAN SECTION N/A 09/22/2015   Procedure: CESAREAN SECTION;  Surgeon: Jerelyn Charles, MD;  Location: [facility];  Service: Obstetrics;  Laterality: N/A;   CHOLECYSTECTOMY     CHOLECYSTECTOMY, LAPAROSCOPIC     DIAGNOSTIC LAPAROSCOPY     removal of meckels diverticulum   LAPAROSCOPIC SMALL BOWEL RESECTION N/A 11/02/2017   Procedure: LAPAROSCOPIC REMOVAL OF MECKEL'S DIVERTICULUM ERAS PATHWAY;  Surgeon: Excell Seltzer, MD;  Location: [facility];  Service: General;  Laterality: N/A;   TUBAL LIGATION     WISDOM TOOTH EXTRACTION     Social History   Tobacco Use   Smoking status: Every Day    Packs/day: 0.50    Years: 10.00    Pack years: 5.00    Types: Cigarettes   Smokeless tobacco: Never  Vaping Use   Vaping Use: Former  Substance Use Topics   Alcohol use: Yes    Alcohol/week: 2.0 standard drinks    Types: 2 Cans of beer per week    Comment: rare   Drug use: No   Family History  Problem Relation Age of Onset   Hypertension Father    Hyperlipidemia Father    Prostate cancer Father    Aneurysm Maternal Grandfather    Depression Mother        after a car accident   Colon polyps Mother    Other Mother        ? CAD /heart disease   Depression Brother        major depression   Graves' disease Paternal Uncle    Alzheimer's disease Paternal Grandmother    Alzheimer's disease Paternal Grandfather    Celiac disease Cousin        maternal cousin   Allergies  Allergen Reactions   Amoxicillin Other (See Comments)    REACTION: yeast infection in mouth Has patient had a PCN reaction causing immediate rash, facial/tongue/throat swelling, SOB or lightheadedness with hypotension: No Has patient had a PCN reaction causing severe rash involving mucus membranes or skin necrosis: No Has patient had  a PCN reaction that required hospitalization no Has patient had a PCN reaction occurring within the last 10 years: Yes If all of the above answers are "NO", then may proceed with Cephalosporin use.   Current Outpatient Medications on File Prior to Visit  Medication Sig Dispense Refill   amphetamine-dextroamphetamine (ADDERALL) 10 MG tablet Take 20 mg by  mouth 3 (three) times daily.     buPROPion (WELLBUTRIN XL) 300 MG 24 hr tablet Take 1 tablet (300 mg total) by mouth daily. 30 tablet 0   hydrochlorothiazide (HYDRODIURIL) 25 MG tablet TAKE 1 TABLET BY MOUTH EVERY DAY FOR BLOOD PRESSURE 90 tablet 1   PREVIDENT 5000 BOOSTER PLUS 1.1 % PSTE See admin instructions.     traZODone (DESYREL) 100 MG tablet Take 1 tablet (100 mg total) by mouth at bedtime as needed for sleep. 60 tablet 0   No current facility-administered medications on file prior to visit.    Review of Systems  Constitutional:  Positive for fatigue. Negative for activity change, appetite change, fever and unexpected weight change.       Fatigue from schedule  HENT:  Negative for congestion, ear pain, rhinorrhea, sinus pressure and sore throat.   Eyes:  Negative for pain, redness and visual disturbance.  Respiratory:  Negative for cough, shortness of breath and wheezing.   Cardiovascular:  Negative for chest pain and palpitations.  Gastrointestinal:  Negative for abdominal pain, blood in stool, constipation and diarrhea.  Endocrine: Negative for polydipsia and polyuria.  Genitourinary:  Negative for dysuria, frequency and urgency.  Musculoskeletal:  Negative for arthralgias, back pain and myalgias.  Skin:  Negative for pallor and rash.  Allergic/Immunologic: Negative for environmental allergies.  Neurological:  Negative for dizziness, syncope and headaches.  Hematological:  Negative for adenopathy. Does not bruise/bleed easily.  Psychiatric/Behavioral:  Negative for decreased concentration and dysphoric mood. The patient is not  nervous/anxious.        Mood is stable in terms of depression and anxiety      Objective:   Physical Exam Constitutional:      General: She is not in acute distress.    Appearance: Normal appearance. She is well-developed.     Comments: Overweight   HENT:     Head: Normocephalic and atraumatic.  Eyes:     General: No scleral icterus.    Conjunctiva/sclera: Conjunctivae normal.     Pupils: Pupils are equal, round, and reactive to light.  Neck:     Thyroid: No thyromegaly.     Vascular: No carotid bruit or JVD.  Cardiovascular:     Rate and Rhythm: Normal rate and regular rhythm.     Pulses: Normal pulses.     Heart sounds: Normal heart sounds.    No gallop.  Pulmonary:     Effort: Pulmonary effort is normal. No respiratory distress.     Breath sounds: Normal breath sounds. No wheezing or rales.     Comments: No wheezing Abdominal:     General: Bowel sounds are normal. There is no distension or abdominal bruit.     Palpations: Abdomen is soft. There is no mass.     Tenderness: There is no abdominal tenderness.  Musculoskeletal:     Cervical back: Normal range of motion and neck supple.     Right lower leg: No edema.     Left lower leg: No edema.  Lymphadenopathy:     Cervical: No cervical adenopathy.  Skin:    General: Skin is warm and dry.     Coloration: Skin is not pale.     Findings: No rash.     Comments: tanned  Neurological:     Mental Status: She is alert.     Coordination: Coordination normal.     Deep Tendon Reflexes: Reflexes are normal and symmetric. Reflexes normal.     Comments: No tremor  Psychiatric:        Mood and Affect: Mood normal. Affect is blunt.        Cognition and Memory: Cognition normal.     Comments: Pleasant           Assessment & Plan:   Problem List Items Addressed This Visit       Cardiovascular and Mediastinum   Essential hypertension - Primary    bp is up slightly  bp in fair control at this time  BP Readings from  Last 1 Encounters:  03/24/21 140/88   Most recent labs reviewed  Disc lifstyle change with low sodium diet and exercise   Will continue the hctz 25 mg daily  Consider ace if this does not go back down Pt has access to a bp cuff and will start checking at home Disc proper way to check and only when relaxed If above 140/90 or other issues will call  Enc strongly to consider some small lifestyle changes in the interim  Recommend smoking cessation when ready  DASH eating handout given  F/u 3 mo         Other   TOBACCO USE    No changes in smoking status She is not ready to quit        Hyperlipidemia    Trig up to 425 at last check (per pt not fasting) Disc goals for lipids and reasons to control them Rev last labs with pt Rev low sat fat diet in detail Plan to work on lower trans and sat fat diet  Re check at 3 month visit

## 2021-03-24 NOTE — Assessment & Plan Note (Signed)
Trig up to 425 at last check (per pt not fasting) Disc goals for lipids and reasons to control them Rev last labs with pt Rev low sat fat diet in detail Plan to work on lower trans and sat fat diet  Re check at 3 month visit

## 2021-03-24 NOTE — Assessment & Plan Note (Signed)
No changes in smoking status She is not ready to quit

## 2021-03-24 NOTE — Patient Instructions (Addendum)
Work on lifestyle change   Check your blood pressure at home when you are relaxed   Let us know if you are running consistently above 140 on top or 90 on the bottom Take care of yourself  Back off of high sodium/processed foods  Drink lots of water   Think about exercise once the kids are in school   Too low is 90/50 -and dizzy   Follow up in 3 months  -come eating light (no fatty stuff or sugary stuff that day) , and we will check labs during that visit

## 2021-03-24 NOTE — Assessment & Plan Note (Addendum)
bp is up slightly  bp in fair control at this time  BP Readings from Last 1 Encounters:  03/24/21 140/88   Most recent labs reviewed  Disc lifstyle change with low sodium diet and exercise   Will continue the hctz 25 mg daily  Consider ace if this does not go back down Pt has access to a bp cuff and will start checking at home Disc proper way to check and only when relaxed If above 140/90 or other issues will call  Enc strongly to consider some small lifestyle changes in the interim  Recommend smoking cessation when ready  DASH eating handout given  F/u 3 mo

## 2021-06-25 ENCOUNTER — Ambulatory Visit: Payer: Managed Care, Other (non HMO) | Admitting: Family Medicine

## 2021-07-03 ENCOUNTER — Other Ambulatory Visit: Payer: Self-pay | Admitting: Family Medicine

## 2021-07-03 DIAGNOSIS — I10 Essential (primary) hypertension: Secondary | ICD-10-CM

## 2022-01-06 ENCOUNTER — Other Ambulatory Visit: Payer: Self-pay | Admitting: Family Medicine

## 2022-01-06 DIAGNOSIS — I10 Essential (primary) hypertension: Secondary | ICD-10-CM

## 2022-01-06 NOTE — Telephone Encounter (Signed)
Please schedule f/u appt.

## 2022-01-06 NOTE — Telephone Encounter (Signed)
Last OV was 03/24/21 but check out instructions were to f/u in 3 months. Looks like pt cancelled that appt and never r/s, no future appts., please advise

## 2022-01-07 NOTE — Telephone Encounter (Signed)
Letter mailed letting pt know ?

## 2022-04-05 ENCOUNTER — Ambulatory Visit: Payer: Managed Care, Other (non HMO) | Admitting: Family Medicine

## 2022-04-05 ENCOUNTER — Encounter: Payer: Self-pay | Admitting: Family Medicine

## 2022-04-05 VITALS — BP 126/78 | HR 96 | Temp 97.9°F | Ht 67.0 in | Wt 191.2 lb

## 2022-04-05 DIAGNOSIS — F17200 Nicotine dependence, unspecified, uncomplicated: Secondary | ICD-10-CM | POA: Diagnosis not present

## 2022-04-05 DIAGNOSIS — J209 Acute bronchitis, unspecified: Secondary | ICD-10-CM | POA: Diagnosis not present

## 2022-04-05 DIAGNOSIS — I10 Essential (primary) hypertension: Secondary | ICD-10-CM | POA: Diagnosis not present

## 2022-04-05 LAB — COMPREHENSIVE METABOLIC PANEL
ALT: 30 U/L (ref 0–35)
AST: 26 U/L (ref 0–37)
Albumin: 3.7 g/dL (ref 3.5–5.2)
Alkaline Phosphatase: 78 U/L (ref 39–117)
BUN: 5 mg/dL — ABNORMAL LOW (ref 6–23)
CO2: 29 mEq/L (ref 19–32)
Calcium: 8.8 mg/dL (ref 8.4–10.5)
Chloride: 94 mEq/L — ABNORMAL LOW (ref 96–112)
Creatinine, Ser: 0.71 mg/dL (ref 0.40–1.20)
GFR: 110.47 mL/min (ref >60.00)
Glucose, Bld: 103 mg/dL — ABNORMAL HIGH (ref 70–99)
Potassium: 3 mEq/L — ABNORMAL LOW (ref 3.5–5.1)
Sodium: 137 mEq/L (ref 135–145)
Total Bilirubin: 0.5 mg/dL (ref 0.2–1.2)
Total Protein: 6 g/dL (ref 6.0–8.3)

## 2022-04-05 LAB — CBC WITH DIFFERENTIAL/PLATELET
Basophils Absolute: 0.1 10*3/uL (ref 0.0–0.1)
Basophils Relative: 0.6 % (ref 0.0–3.0)
Eosinophils Absolute: 0.7 10*3/uL (ref 0.0–0.7)
Eosinophils Relative: 4.3 % (ref 0.0–5.0)
HCT: 47.4 % — ABNORMAL HIGH (ref 36.0–46.0)
Hemoglobin: 16.6 g/dL — ABNORMAL HIGH (ref 12.0–15.0)
Lymphocytes Relative: 26 % (ref 12.0–46.0)
Lymphs Abs: 4 10*3/uL (ref 0.7–4.0)
MCHC: 35 g/dL (ref 30.0–36.0)
MCV: 92 fl (ref 78.0–100.0)
Monocytes Absolute: 0.9 10*3/uL (ref 0.1–1.0)
Monocytes Relative: 5.9 % (ref 3.0–12.0)
Neutro Abs: 9.8 10*3/uL — ABNORMAL HIGH (ref 1.4–7.7)
Neutrophils Relative %: 63.2 % (ref 43.0–77.0)
Platelets: 257 10*3/uL (ref 150.0–400.0)
RBC: 5.16 Mil/uL — ABNORMAL HIGH (ref 3.87–5.11)
RDW: 14.2 % (ref 11.5–15.5)
WBC: 15.5 10*3/uL — ABNORMAL HIGH (ref 4.0–10.5)

## 2022-04-05 LAB — TSH: TSH: 2.38 u[IU]/mL (ref 0.35–5.50)

## 2022-04-05 MED ORDER — BENZONATATE 200 MG PO CAPS: 200.0000 mg | ORAL_CAPSULE | Freq: Three times a day (TID) | ORAL | 1 refills | Status: DC | PRN

## 2022-04-05 MED ORDER — ALBUTEROL SULFATE HFA 108 (90 BASE) MCG/ACT IN AERS: 2.0000 | INHALATION_SPRAY | RESPIRATORY_TRACT | 1 refills | Status: DC | PRN

## 2022-04-05 MED ORDER — PREDNISONE 10 MG PO TABS: ORAL_TABLET | ORAL | 0 refills | Status: DC

## 2022-04-05 MED ORDER — AZITHROMYCIN 250 MG PO TABS: ORAL_TABLET | ORAL | 0 refills | Status: DC

## 2022-04-05 MED ORDER — HYDROCHLOROTHIAZIDE 25 MG PO TABS: ORAL_TABLET | ORAL | 3 refills | Status: DC

## 2022-04-05 NOTE — Progress Notes (Signed)
Subjective:    Patient ID: Michele Rojas, female    DOB: 1985-08-26, 36 y.o.   MRN: [id number]  HPI Pt presents with cough and congestion (over 2 weeks)  Also HTN and labs (overdue)  Wt Readings from Last 3 Encounters:  04/05/22 191 lb 4 oz (86.8 kg)  03/24/21 189 lb 8 oz (86 kg)  12/16/20 196 lb 3 oz (89 kg)   29.95 kg/m  Current smoker   Over 2 weeks Cough is driving her crazy  Some wheezing  Phlegm is yellow now   Nasal congestion  Yellow in color as well   Some sinus pressure  Ears pop-cannot hear well   No fever that she knows of   Pnd  Cannot smell or taste  Throat feels irritated    Otc: Nyquil  Day quil  Occ cough DM   Cut down smoking to 1/4 ppd when sick     2 home tests were neg   Feels hot a lot   HTN bp is stable today  No cp or palpitations or headaches or edema  No side effects to medicines  BP Readings from Last 3 Encounters:  04/05/22 126/78  03/24/21 140/88  12/16/20 138/80    Hctz 25 mg daily  Tolerates well  Habits are fair  Still smokes  Needs labs Just at a gravy biscuit - not fasting for labs  Patient Active Problem List   Diagnosis Date Noted   Acute bronchitis 04/05/2022   Visit for routine gyn exam 02/13/2019   Heavy menses 02/13/2019   PTSD (post-traumatic stress disorder) 05/04/2018   History of drug overdose 05/01/2018   Severe recurrent major depression without psychotic features ([place]) 05/01/2018   H/O suicide attempt 05/01/2018   GERD (gastroesophageal reflux disease) 02/13/2018   Fatty liver 02/13/2018   Lower abdominal pain 02/13/2018   Weight gain, abnormal 07/28/2017   Routine general medical examination at a health care facility 05/25/2016   Lipoma of lower extremity 03/08/2016   Hyperlipidemia 02/22/2016   Leukocytosis 02/20/2016   Post partum depression 01/08/2016   Essential hypertension 01/07/2016   Encounter for biometric screening 09/30/2013   Diabetes mellitus screening 09/28/2013    Hypothyroid 11/04/2011   Depression 10/04/2011   HEADACHE 11/18/2009   Adjustment disorder with mixed anxiety and depressed mood 11/17/2009   FATIGUE 10/06/2009   TOBACCO USE 02/19/2009   CHICKENPOX, HX OF 02/19/2009   DEFICIENCY OF OTHER VITAMINS 04/29/2008   OTHER CONSTIPATION 04/29/2008   Past Medical History:  Diagnosis Date   ADD (attention deficit disorder)    no meds   Anxiety    Constipation    Depression    Fatigue    Fatty liver    Gallstones    GERD (gastroesophageal reflux disease)    History of chicken pox as a child   History of preterm delivery    2015- 31wks, 2017- 35wks   HLD (hyperlipidemia)    HPV (human papilloma virus) infection    HTN (hypertension)    No meds   Hx of abnormal cervical Pap smear    ASCUS, LGSIL, CIN I, Colpo   Hypothyroidism    no meds   Meckel's diverticulitis    Mood swings    Obesity    Preeclampsia    2015 & 2017 pregnancies   Right ovarian cyst    2.5cm   Smoker    Tobacco abuse    Vitamin deficiency    Past Surgical History:  Procedure Laterality Date  ACHILLES TENDON SURGERY Bilateral    BONE MARROW BIOPSY  06/2017   CESAREAN SECTION N/A 06/09/2014   Procedure: CESAREAN SECTION;  Surgeon: Farrel Gobble. Harrington Challenger, MD;  Location: [facility];  Service: Obstetrics;  Laterality: N/A;   CESAREAN SECTION N/A 09/22/2015   Procedure: CESAREAN SECTION;  Surgeon: Jerelyn Charles, MD;  Location: [facility];  Service: Obstetrics;  Laterality: N/A;   CHOLECYSTECTOMY     CHOLECYSTECTOMY, LAPAROSCOPIC     DIAGNOSTIC LAPAROSCOPY     removal of meckels diverticulum   LAPAROSCOPIC SMALL BOWEL RESECTION N/A 11/02/2017   Procedure: LAPAROSCOPIC REMOVAL OF MECKEL'S DIVERTICULUM ERAS PATHWAY;  Surgeon: Excell Seltzer, MD;  Location: [facility];  Service: General;  Laterality: N/A;   TUBAL LIGATION     WISDOM TOOTH EXTRACTION     Social History   Tobacco Use   Smoking status: Every Day    Packs/day: 0.50    Years: 10.00    Total pack years: 5.00     Types: Cigarettes   Smokeless tobacco: Never  Vaping Use   Vaping Use: Former  Substance Use Topics   Alcohol use: Yes    Alcohol/week: 2.0 standard drinks of alcohol    Types: 2 Cans of beer per week    Comment: rare   Drug use: No   Family History  Problem Relation Age of Onset   Hypertension Father    Hyperlipidemia Father    Prostate cancer Father    Aneurysm Maternal Grandfather    Depression Mother        after a car accident   Colon polyps Mother    Other Mother        ? CAD /heart disease   Depression Brother        major depression   Graves' disease Paternal Uncle    Alzheimer's disease Paternal Grandmother    Alzheimer's disease Paternal Grandfather    Celiac disease Cousin        maternal cousin   Allergies  Allergen Reactions   Amoxicillin Other (See Comments)    REACTION: yeast infection in mouth Has patient had a PCN reaction causing immediate rash, facial/tongue/throat swelling, SOB or lightheadedness with hypotension: No Has patient had a PCN reaction causing severe rash involving mucus membranes or skin necrosis: No Has patient had a PCN reaction that required hospitalization no Has patient had a PCN reaction occurring within the last 10 years: Yes If all of the above answers are "NO", then may proceed with Cephalosporin use.   Current Outpatient Medications on File Prior to Visit  Medication Sig Dispense Refill   amphetamine-dextroamphetamine (ADDERALL) 10 MG tablet Take 20 mg by mouth 3 (three) times daily.     buPROPion (WELLBUTRIN XL) 300 MG 24 hr tablet Take 1 tablet (300 mg total) by mouth daily. 30 tablet 0   PREVIDENT 5000 BOOSTER PLUS 1.1 % PSTE See admin instructions.     traZODone (DESYREL) 100 MG tablet Take 1 tablet (100 mg total) by mouth at bedtime as needed for sleep. 60 tablet 0   No current facility-administered medications on file prior to visit.    Review of Systems  Constitutional:  Positive for appetite change and fatigue.  Negative for fever.  HENT:  Positive for congestion, postnasal drip, rhinorrhea, sinus pressure, sneezing and sore throat. Negative for ear pain.   Eyes:  Negative for pain and discharge.  Respiratory:  Positive for cough and wheezing. Negative for shortness of breath and stridor.   Cardiovascular:  Negative for  chest pain.  Gastrointestinal:  Negative for diarrhea, nausea and vomiting.  Genitourinary:  Negative for frequency, hematuria and urgency.  Musculoskeletal:  Negative for arthralgias and myalgias.  Skin:  Negative for rash.  Neurological:  Positive for headaches. Negative for dizziness, weakness and light-headedness.  Psychiatric/Behavioral:  Negative for confusion and dysphoric mood.        Objective:   Physical Exam Constitutional:      General: She is not in acute distress.    Appearance: Normal appearance. She is well-developed. She is obese. She is not ill-appearing, toxic-appearing or diaphoretic.  HENT:     Head: Normocephalic and atraumatic.     Comments: Nares are injected and congested      Right Ear: Tympanic membrane, ear canal and external ear normal.     Left Ear: Tympanic membrane, ear canal and external ear normal.     Ears:     Comments: Effusion in R TM    Nose: Congestion and rhinorrhea present.     Mouth/Throat:     Mouth: Mucous membranes are moist.     Pharynx: Oropharynx is clear. No oropharyngeal exudate or posterior oropharyngeal erythema.     Comments: Clear pnd  Eyes:     General:        Right eye: No discharge.        Left eye: No discharge.     Conjunctiva/sclera: Conjunctivae normal.     Pupils: Pupils are equal, round, and reactive to light.  Cardiovascular:     Rate and Rhythm: Normal rate.     Heart sounds: Normal heart sounds.  Pulmonary:     Effort: Pulmonary effort is normal. No respiratory distress.     Breath sounds: No stridor. Wheezing and rhonchi present. No rales.     Comments: Scattered rhonchi Wheeze on forced  exp Chest:     Chest wall: No tenderness.  Musculoskeletal:     Cervical back: Normal range of motion and neck supple.     Right lower leg: No edema.     Left lower leg: No edema.  Lymphadenopathy:     Cervical: No cervical adenopathy.  Skin:    General: Skin is warm and dry.     Capillary Refill: Capillary refill takes less than 2 seconds.     Findings: No rash.  Neurological:     Mental Status: She is alert.     Cranial Nerves: No cranial nerve deficit.  Psychiatric:        Mood and Affect: Mood normal.           Assessment & Plan:   Problem List Items Addressed This Visit       Cardiovascular and Mediastinum   Essential hypertension    bp in fair control at this time  BP Readings from Last 1 Encounters:  04/05/22 126/78  No changes needed Most recent labs reviewed  Disc lifstyle change with low sodium diet and exercise  Taking hctz 25 mg daily and tolerating it  Lab today (not fasting so not cholesterol)      Relevant Medications   hydrochlorothiazide (HYDRODIURIL) 25 MG tablet   Other Relevant Orders   TSH   Comprehensive metabolic panel   CBC with Differential/Platelet     Respiratory   Acute bronchitis - Primary    Viral , in a smoker  Px prednisone taper 40 mg  Also zpak in light of length of illness and smoking Disc sympt control  Tessalon px and albuterol mdi  ER precautions rev  Update if not starting to improve in a week or if worsening          Other   TOBACCO USE    Disc in detail risks of smoking and possible outcomes including copd, vascular/ heart disease, cancer , respiratory and sinus infections  Pt voices understanding She cut down to 1/4 ppd during illness Enc her strongly to keep going with that

## 2022-04-05 NOTE — Assessment & Plan Note (Signed)
Disc in detail risks of smoking and possible outcomes including copd, vascular/ heart disease, cancer , respiratory and sinus infections  Pt voices understanding She cut down to 1/4 ppd during illness Enc her strongly to keep going with that

## 2022-04-05 NOTE — Assessment & Plan Note (Signed)
Viral , in a smoker  Px prednisone taper 40 mg  Also zpak in light of length of illness and smoking Disc sympt control  Tessalon px and albuterol mdi  ER precautions rev  Update if not starting to improve in a week or if worsening

## 2022-04-05 NOTE — Patient Instructions (Signed)
Drink fluids  Take the zpak and the prednisone as directed for bronchitis   The tessalon is for cough  You can still use the otc medicine   Keep thinking about quitting smoking   Update if not starting to improve in a week or if worsening

## 2022-04-05 NOTE — Assessment & Plan Note (Signed)
bp in fair control at this time  BP Readings from Last 1 Encounters:  04/05/22 126/78   No changes needed Most recent labs reviewed  Disc lifstyle change with low sodium diet and exercise  Taking hctz 25 mg daily and tolerating it  Lab today (not fasting so not cholesterol)

## 2022-04-06 ENCOUNTER — Telehealth: Payer: Self-pay

## 2022-04-06 ENCOUNTER — Telehealth: Payer: Self-pay | Admitting: *Deleted

## 2022-04-06 MED ORDER — POTASSIUM CHLORIDE CRYS ER 10 MEQ PO TBCR: 10.0000 meq | EXTENDED_RELEASE_TABLET | Freq: Two times a day (BID) | ORAL | 0 refills | Status: DC

## 2022-04-06 NOTE — Telephone Encounter (Signed)
-----   Message from Abner Greenspan, MD sent at 04/05/2022  5:31 PM EDT ----- Your potassium is low  Please send in klor con 20 meq and take one daily  Re check bmet in 7-10 days for this  Hb and wbc are still high (this is baseline) -quitting smoking may help in the future

## 2022-04-06 NOTE — Telephone Encounter (Signed)
Prior auth started for Ventolin HFA 108 (90 Base)MCG/ACT aerosol. Elmer Sow Key: [id number] - Rx #: [id number] Per Cover my meds, this drug is not covered by plan, no clinical questions were available to answer.  I called [facility], per Cover my meds, who stated this Rx will go through [place] directly.  I called [facility] and spoke to [place], who stated this is not a formulary product, but she initiated a PA.  Case ID [id number].  It will take up to 8 business days for a response, which will be faxed to [place].  She also said that Albuterol Sulfate is a preferred alternative.

## 2022-04-06 NOTE — Telephone Encounter (Signed)
Pt notified of lab results and Dr. Marliss Coots comments f/u lab appt scheduled and Rx sent to pharmacy

## 2022-04-15 ENCOUNTER — Telehealth: Payer: Self-pay | Admitting: Family Medicine

## 2022-04-15 DIAGNOSIS — E876 Hypokalemia: Secondary | ICD-10-CM

## 2022-04-15 DIAGNOSIS — I10 Essential (primary) hypertension: Secondary | ICD-10-CM

## 2022-04-15 NOTE — Telephone Encounter (Signed)
-----   Message from Ellamae Sia sent at 04/07/2022 12:08 PM EDT ----- Regarding: Lab orders for Friday, 9.8.23 Lab order fo K+, ? Thanks

## 2022-04-15 NOTE — Telephone Encounter (Signed)
Prior auth for Ventolin HFA 108 (90 Base)MCG/ACT aerosol was denied. Elmer Sow Key: [id number] - Rx #: [id number]  I called [facility] and spoke to [facility].  This PA has been denied.  I was not able to get the reason because their fax viewing system is currently down, so they can not see the reason.  The denial letter fax was sent out to [place] last night.  I called [facility].  Patient has not picked up Rx yet.  They said they will change this to the alternative: albuterol sulfate, without a new prescription.  Patient notified via mychart.

## 2022-04-16 ENCOUNTER — Other Ambulatory Visit: Payer: Managed Care, Other (non HMO)

## 2022-04-21 ENCOUNTER — Other Ambulatory Visit: Payer: Self-pay | Admitting: Family Medicine

## 2022-04-22 MED ORDER — POTASSIUM CHLORIDE CRYS ER 20 MEQ PO TBCR: 20.0000 meq | EXTENDED_RELEASE_TABLET | Freq: Every day | ORAL | 1 refills | Status: DC

## 2022-04-23 ENCOUNTER — Other Ambulatory Visit: Payer: Managed Care, Other (non HMO)

## 2022-06-28 ENCOUNTER — Telehealth: Payer: Managed Care, Other (non HMO) | Admitting: Family Medicine

## 2022-06-28 ENCOUNTER — Encounter: Payer: Self-pay | Admitting: Internal Medicine

## 2022-06-28 ENCOUNTER — Telehealth (INDEPENDENT_AMBULATORY_CARE_PROVIDER_SITE_OTHER): Payer: Managed Care, Other (non HMO) | Admitting: Internal Medicine

## 2022-06-28 VITALS — Temp 98.6°F | Ht 66.0 in | Wt 181.0 lb

## 2022-06-28 DIAGNOSIS — J039 Acute tonsillitis, unspecified: Secondary | ICD-10-CM

## 2022-06-28 NOTE — Progress Notes (Signed)
Subjective:    Patient ID: Michele Rojas, female    DOB: 20-Oct-1985, 36 y.o.   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     buPROPion (WELLBUTRIN XL) 300 MG 24 hr tablet Take 1 tablet (300 mg total) by mouth daily. 30 tablet 0   hydrochlorothiazide (HYDRODIURIL) 25 MG tablet TAKE 1 TABLET BY MOUTH EVERY DAY FOR BLOOD PRESSURE 90 tablet 3   traZODone (DESYREL) 100 MG tablet Take 1 tablet (100 mg total) by mouth at bedtime as needed for sleep. 60 tablet 0   potassium chloride SA (KLOR-CON M20) 20 MEQ tablet Take 1 tablet (20 mEq total) by mouth daily. (Patient not taking: Reported on 06/28/2022) 90 tablet 1   PREVIDENT 5000 BOOSTER PLUS 1.1 % PSTE See admin instructions. (Patient not taking: Reported on 06/28/2022)     No current facility-administered medications on file prior to visit.    Allergies  Allergen Reactions   Amoxicillin Other (See Comments)    REACTION: yeast infection in mouth Has patient had a PCN reaction causing immediate rash, facial/tongue/throat swelling, SOB or lightheadedness with hypotension: No Has patient had a PCN reaction causing severe rash involving mucus membranes or skin  necrosis: No Has patient had a PCN reaction that required hospitalization no Has patient had a PCN reaction occurring within the last 10 years: Yes If all of the above answers are "NO", then may proceed with Cephalosporin use.    Past Medical History:  Diagnosis Date   ADD (attention deficit disorder)    no meds   Anxiety    Constipation    Depression    Fatigue    Fatty liver    Gallstones    GERD (gastroesophageal reflux disease)    History of chicken pox as a child   History of preterm delivery    2015- 31wks, 2017- 35wks   HLD (hyperlipidemia)    HPV (human papilloma virus) infection    HTN (hypertension)    No meds   Hx of abnormal cervical Pap smear    ASCUS, LGSIL, CIN I, Colpo   Hypothyroidism    no meds   Meckel's diverticulitis    Mood swings    Obesity    Preeclampsia    2015 & 2017 pregnancies   Right ovarian cyst    2.5cm   Smoker    Tobacco abuse    Vitamin deficiency     Past Surgical History:  Procedure Laterality Date   ACHILLES TENDON SURGERY Bilateral    BONE MARROW BIOPSY  06/2017   CESAREAN SECTION N/A 06/09/2014   Procedure: CESAREAN SECTION;  Surgeon: Farrel Gobble. Harrington Challenger, MD;  Location: [facility];  Service: Obstetrics;  Laterality: N/A;   CESAREAN SECTION N/A 09/22/2015   Procedure: CESAREAN SECTION;  Surgeon: Jerelyn Charles, MD;  Location: [facility];  Service: Obstetrics;  Laterality: N/A;   CHOLECYSTECTOMY     CHOLECYSTECTOMY, LAPAROSCOPIC     DIAGNOSTIC LAPAROSCOPY     removal of meckels diverticulum   LAPAROSCOPIC SMALL BOWEL RESECTION N/A 11/02/2017   Procedure: LAPAROSCOPIC REMOVAL OF MECKEL'S DIVERTICULUM ERAS PATHWAY;  Surgeon: Excell Seltzer, MD;  Location: [facility];  Service: General;  Laterality: N/A;   TUBAL LIGATION     WISDOM TOOTH EXTRACTION      Family History  Problem Relation Age of Onset   Hypertension Father    Hyperlipidemia Father    Prostate cancer Father    Aneurysm Maternal Grandfather    Depression Mother        after  a car accident   Colon polyps Mother    Other Mother        ? CAD /heart disease   Depression Brother        major depression   Graves' disease Paternal Uncle    Alzheimer's disease Paternal Grandmother    Alzheimer's disease Paternal Grandfather    Celiac disease Cousin        maternal cousin    Social History   Socioeconomic History   Marital status: Married    Spouse name: Not on file   Number of children: 2   Years of education: Not on file   Highest education level: Not on file  Occupational History   Occupation: Hmemaker  Tobacco Use   Smoking status: Every Day    Packs/day: 0.50    Years: 10.00    Total pack years: 5.00    Types: Cigarettes   Smokeless tobacco: Never  Vaping Use   Vaping Use: Former  Substance and Sexual Activity   Alcohol use: Yes    Alcohol/week: 2.0 standard drinks of alcohol    Types: 2 Cans of beer per week    Comment: rare   Drug use: No   Sexual activity: Yes    Partners: Male    Birth control/protection: Surgical  Other Topics Concern   Not on file  Social History Narrative   GYN- Dr. 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 [employer] Strain: [place]  (06/21/2018)   Overall Financial Resource Strain (CARDIA)    Difficulty of Paying Living Expenses: Not very hard  Food Insecurity: Food Insecurity Present (06/21/2018)   Hunger Vital Sign    Worried About [place] in the Last Year: Sometimes true    Ran Out of Food in the Last Year: Sometimes true  Transportation Needs: No Transportation Needs (06/21/2018)   PRAPARE - Hydrologist (Medical): No    Lack of Transportation (Non-Medical): No  Physical Activity: Inactive (06/21/2018)   Exercise Vital Sign    Days of Exercise per Week: 0 days    Minutes of Exercise per Session: 0 min  Stress: Stress  Concern Present (06/21/2018)   [place]    Feeling of Stress : Rather much  Social Connections: Somewhat Isolated (06/21/2018)   Social Connection and Isolation Panel [NHANES]    Frequency of Communication with Friends and Family: More than three times a week    Frequency of Social Gatherings with Friends and Family: Three  times a week    Attends Religious Services: Never    Active Member of Clubs or Organizations: No    Attends Archivist Meetings: Never    Marital Status: Married  Human resources officer Violence: Not At Risk (06/21/2018)   Humiliation, Afraid, Rape, and Kick questionnaire    Fear of Current or Ex-Partner: No    Emotionally Abused: No    Physically Abused: No    Sexually Abused: No   Review of Systems Some frontal and left temporal headache Feels like something is going on in ears No N/V Eating okay--some pain with swallowing    Objective:   Physical Exam Constitutional:      Appearance: Normal appearance.  HENT:     Mouth/Throat:     Comments: Tonsils are 1+  ?slight whitish area on left tonsil Lymphadenopathy:     Cervical: No cervical adenopathy.  Neurological:     Mental Status: She is alert.            Assessment & Plan:

## 2022-06-28 NOTE — Assessment & Plan Note (Signed)
Discussed the unlikely possibility of strep Continue analgesics Try chloraseptic  Will hold off on PCN If worsens and still sick next week, would treat empirically for sinusitis with amoxil

## 2023-02-22 ENCOUNTER — Other Ambulatory Visit: Payer: Self-pay | Admitting: Family Medicine

## 2023-02-23 NOTE — Telephone Encounter (Signed)
Pt is due for her CPE or at least a med refill f/u appt., please schedule and then route back to me to fill thanks

## 2023-02-24 NOTE — Telephone Encounter (Signed)
LVM for patient to c/b and schedule.  

## 2023-03-01 MED ORDER — HYDROCHLOROTHIAZIDE 25 MG PO TABS: ORAL_TABLET | ORAL | 0 refills | Status: AC

## 2023-03-01 MED ORDER — POTASSIUM CHLORIDE CRYS ER 20 MEQ PO TBCR: 20.0000 meq | EXTENDED_RELEASE_TABLET | Freq: Every day | ORAL | 0 refills | Status: AC

## 2023-03-01 NOTE — Addendum Note (Signed)
Addended by: Shon Millet on: 03/01/2023 11:29 AM   Modules accepted: Orders

## 2023-03-01 NOTE — Telephone Encounter (Signed)
Scheduled pt's cpe for 03/30/23

## 2023-03-01 NOTE — Telephone Encounter (Signed)
Lvmtcb, sent mychart message  

## 2023-03-09 ENCOUNTER — Encounter (INDEPENDENT_AMBULATORY_CARE_PROVIDER_SITE_OTHER): Payer: Self-pay

## 2023-03-23 ENCOUNTER — Other Ambulatory Visit: Payer: Managed Care, Other (non HMO)

## 2023-03-29 ENCOUNTER — Telehealth: Payer: Self-pay | Admitting: Family Medicine

## 2023-03-29 DIAGNOSIS — Z131 Encounter for screening for diabetes mellitus: Secondary | ICD-10-CM

## 2023-03-29 DIAGNOSIS — I10 Essential (primary) hypertension: Secondary | ICD-10-CM

## 2023-03-29 DIAGNOSIS — E039 Hypothyroidism, unspecified: Secondary | ICD-10-CM

## 2023-03-29 DIAGNOSIS — E7800 Pure hypercholesterolemia, unspecified: Secondary | ICD-10-CM

## 2023-03-29 NOTE — Telephone Encounter (Signed)
-----   Message from Alvina Chou sent at 03/15/2023  4:03 PM EDT ----- Regarding: Lab orders for Friday, 8.23.24 Patient is scheduled for CPX labs, please order future labs, Thanks , Camelia Eng

## 2023-03-30 ENCOUNTER — Encounter: Payer: Managed Care, Other (non HMO) | Admitting: Family Medicine

## 2023-04-01 ENCOUNTER — Other Ambulatory Visit: Payer: Managed Care, Other (non HMO)

## 2023-04-08 ENCOUNTER — Encounter: Payer: Managed Care, Other (non HMO) | Admitting: Family Medicine
